# Patient Record
Sex: Female | Born: 1940 | Race: Black or African American | Hispanic: No | Smoking: Never smoker
Health system: Southern US, Community
[De-identification: ages and names within clinical notes are randomized; demographics above are authoritative.]

## PROBLEM LIST (undated history)

## (undated) DIAGNOSIS — Z794 Long term (current) use of insulin: Secondary | ICD-10-CM

## (undated) DIAGNOSIS — E7800 Pure hypercholesterolemia, unspecified: Secondary | ICD-10-CM

## (undated) DIAGNOSIS — Z1231 Encounter for screening mammogram for malignant neoplasm of breast: Secondary | ICD-10-CM

## (undated) DIAGNOSIS — I10 Essential (primary) hypertension: Secondary | ICD-10-CM

## (undated) DIAGNOSIS — E1121 Type 2 diabetes mellitus with diabetic nephropathy: Secondary | ICD-10-CM

## (undated) DIAGNOSIS — E119 Type 2 diabetes mellitus without complications: Secondary | ICD-10-CM

## (undated) DIAGNOSIS — N3000 Acute cystitis without hematuria: Principal | ICD-10-CM

## (undated) DIAGNOSIS — E041 Nontoxic single thyroid nodule: Secondary | ICD-10-CM

## (undated) DIAGNOSIS — Z1211 Encounter for screening for malignant neoplasm of colon: Secondary | ICD-10-CM

## (undated) DIAGNOSIS — M1990 Unspecified osteoarthritis, unspecified site: Secondary | ICD-10-CM

## (undated) DIAGNOSIS — E785 Hyperlipidemia, unspecified: Secondary | ICD-10-CM

## (undated) DIAGNOSIS — K219 Gastro-esophageal reflux disease without esophagitis: Secondary | ICD-10-CM

## (undated) DIAGNOSIS — Z4789 Encounter for other orthopedic aftercare: Secondary | ICD-10-CM

## (undated) DIAGNOSIS — E559 Vitamin D deficiency, unspecified: Secondary | ICD-10-CM

## (undated) DIAGNOSIS — R350 Frequency of micturition: Secondary | ICD-10-CM

## (undated) DIAGNOSIS — M1712 Unilateral primary osteoarthritis, left knee: Secondary | ICD-10-CM

## (undated) DIAGNOSIS — Z96652 Presence of left artificial knee joint: Secondary | ICD-10-CM

## (undated) DIAGNOSIS — R7989 Other specified abnormal findings of blood chemistry: Secondary | ICD-10-CM

## (undated) DIAGNOSIS — E213 Hyperparathyroidism, unspecified: Secondary | ICD-10-CM

## (undated) DIAGNOSIS — M25562 Pain in left knee: Secondary | ICD-10-CM

## (undated) DIAGNOSIS — E042 Nontoxic multinodular goiter: Secondary | ICD-10-CM

## (undated) DIAGNOSIS — Z471 Aftercare following joint replacement surgery: Secondary | ICD-10-CM

## (undated) HISTORY — DX: Type 2 diabetes mellitus without complications: E11.9

## (undated) HISTORY — DX: Hyperlipidemia, unspecified: E78.5

## (undated) HISTORY — DX: Unspecified osteoarthritis, unspecified site: M19.90

## (undated) HISTORY — DX: Gastro-esophageal reflux disease without esophagitis: K21.9

## (undated) HISTORY — DX: Essential (primary) hypertension: I10

---

## 1982-07-16 HISTORY — PX: BREAST EXCISIONAL BIOPSY: SUR124

## 1982-07-16 HISTORY — PX: BREAST SURGERY: SHX581

## 2000-07-16 HISTORY — PX: REPLACEMENT TOTAL KNEE: SUR1224

## 2011-09-04 DIAGNOSIS — R319 Hematuria, unspecified: Secondary | ICD-10-CM | POA: Diagnosis not present

## 2011-09-04 DIAGNOSIS — K219 Gastro-esophageal reflux disease without esophagitis: Secondary | ICD-10-CM | POA: Diagnosis not present

## 2011-09-04 DIAGNOSIS — I10 Essential (primary) hypertension: Secondary | ICD-10-CM | POA: Diagnosis not present

## 2011-09-04 DIAGNOSIS — N390 Urinary tract infection, site not specified: Secondary | ICD-10-CM | POA: Diagnosis not present

## 2011-09-04 DIAGNOSIS — E119 Type 2 diabetes mellitus without complications: Secondary | ICD-10-CM | POA: Diagnosis not present

## 2011-09-04 DIAGNOSIS — E785 Hyperlipidemia, unspecified: Secondary | ICD-10-CM | POA: Diagnosis not present

## 2011-09-27 DIAGNOSIS — Z9189 Other specified personal risk factors, not elsewhere classified: Secondary | ICD-10-CM | POA: Diagnosis not present

## 2011-09-27 DIAGNOSIS — Z1231 Encounter for screening mammogram for malignant neoplasm of breast: Secondary | ICD-10-CM | POA: Diagnosis not present

## 2011-12-04 DIAGNOSIS — E7800 Pure hypercholesterolemia, unspecified: Secondary | ICD-10-CM | POA: Diagnosis not present

## 2011-12-04 DIAGNOSIS — I10 Essential (primary) hypertension: Secondary | ICD-10-CM | POA: Diagnosis not present

## 2012-01-24 DIAGNOSIS — M549 Dorsalgia, unspecified: Secondary | ICD-10-CM | POA: Diagnosis not present

## 2012-01-24 DIAGNOSIS — M5126 Other intervertebral disc displacement, lumbar region: Secondary | ICD-10-CM | POA: Diagnosis not present

## 2012-02-07 DIAGNOSIS — E119 Type 2 diabetes mellitus without complications: Secondary | ICD-10-CM | POA: Diagnosis not present

## 2012-02-07 DIAGNOSIS — K219 Gastro-esophageal reflux disease without esophagitis: Secondary | ICD-10-CM | POA: Diagnosis not present

## 2012-02-07 DIAGNOSIS — E785 Hyperlipidemia, unspecified: Secondary | ICD-10-CM | POA: Diagnosis not present

## 2012-02-07 DIAGNOSIS — I10 Essential (primary) hypertension: Secondary | ICD-10-CM | POA: Diagnosis not present

## 2012-03-11 DIAGNOSIS — R252 Cramp and spasm: Secondary | ICD-10-CM | POA: Diagnosis not present

## 2012-05-12 DIAGNOSIS — Z23 Encounter for immunization: Secondary | ICD-10-CM | POA: Diagnosis not present

## 2012-05-12 DIAGNOSIS — E785 Hyperlipidemia, unspecified: Secondary | ICD-10-CM | POA: Diagnosis not present

## 2012-05-12 DIAGNOSIS — I10 Essential (primary) hypertension: Secondary | ICD-10-CM | POA: Diagnosis not present

## 2012-05-12 DIAGNOSIS — D1739 Benign lipomatous neoplasm of skin and subcutaneous tissue of other sites: Secondary | ICD-10-CM | POA: Diagnosis not present

## 2012-05-12 DIAGNOSIS — E119 Type 2 diabetes mellitus without complications: Secondary | ICD-10-CM | POA: Diagnosis not present

## 2012-05-15 DIAGNOSIS — R222 Localized swelling, mass and lump, trunk: Secondary | ICD-10-CM | POA: Diagnosis not present

## 2012-05-21 DIAGNOSIS — D1779 Benign lipomatous neoplasm of other sites: Secondary | ICD-10-CM | POA: Diagnosis not present

## 2012-05-21 DIAGNOSIS — Z79899 Other long term (current) drug therapy: Secondary | ICD-10-CM | POA: Diagnosis not present

## 2012-05-21 DIAGNOSIS — Z01818 Encounter for other preprocedural examination: Secondary | ICD-10-CM | POA: Diagnosis not present

## 2012-05-27 DIAGNOSIS — Z79899 Other long term (current) drug therapy: Secondary | ICD-10-CM | POA: Diagnosis not present

## 2012-05-27 DIAGNOSIS — D179 Benign lipomatous neoplasm, unspecified: Secondary | ICD-10-CM | POA: Diagnosis not present

## 2012-05-27 DIAGNOSIS — D1779 Benign lipomatous neoplasm of other sites: Secondary | ICD-10-CM | POA: Diagnosis not present

## 2012-05-27 DIAGNOSIS — R229 Localized swelling, mass and lump, unspecified: Secondary | ICD-10-CM | POA: Diagnosis not present

## 2012-06-09 DIAGNOSIS — D179 Benign lipomatous neoplasm, unspecified: Secondary | ICD-10-CM | POA: Diagnosis not present

## 2012-07-21 DIAGNOSIS — B351 Tinea unguium: Secondary | ICD-10-CM | POA: Diagnosis not present

## 2012-07-21 DIAGNOSIS — E1142 Type 2 diabetes mellitus with diabetic polyneuropathy: Secondary | ICD-10-CM | POA: Diagnosis not present

## 2012-07-21 DIAGNOSIS — M21619 Bunion of unspecified foot: Secondary | ICD-10-CM | POA: Diagnosis not present

## 2012-07-21 DIAGNOSIS — E1149 Type 2 diabetes mellitus with other diabetic neurological complication: Secondary | ICD-10-CM | POA: Diagnosis not present

## 2012-08-25 DIAGNOSIS — E7889 Other lipoprotein metabolism disorders: Secondary | ICD-10-CM | POA: Diagnosis not present

## 2012-08-25 DIAGNOSIS — E119 Type 2 diabetes mellitus without complications: Secondary | ICD-10-CM | POA: Diagnosis not present

## 2012-08-25 DIAGNOSIS — E785 Hyperlipidemia, unspecified: Secondary | ICD-10-CM | POA: Diagnosis not present

## 2012-08-25 DIAGNOSIS — I10 Essential (primary) hypertension: Secondary | ICD-10-CM | POA: Diagnosis not present

## 2012-10-13 DIAGNOSIS — R42 Dizziness and giddiness: Secondary | ICD-10-CM | POA: Diagnosis not present

## 2012-10-13 DIAGNOSIS — R1110 Vomiting, unspecified: Secondary | ICD-10-CM | POA: Diagnosis not present

## 2012-10-13 DIAGNOSIS — R110 Nausea: Secondary | ICD-10-CM | POA: Diagnosis not present

## 2012-10-28 DIAGNOSIS — Z9189 Other specified personal risk factors, not elsewhere classified: Secondary | ICD-10-CM | POA: Diagnosis not present

## 2012-10-28 DIAGNOSIS — Z1231 Encounter for screening mammogram for malignant neoplasm of breast: Secondary | ICD-10-CM | POA: Diagnosis not present

## 2012-11-24 DIAGNOSIS — M5430 Sciatica, unspecified side: Secondary | ICD-10-CM | POA: Diagnosis not present

## 2012-11-24 DIAGNOSIS — M25569 Pain in unspecified knee: Secondary | ICD-10-CM | POA: Diagnosis not present

## 2012-11-24 DIAGNOSIS — I10 Essential (primary) hypertension: Secondary | ICD-10-CM | POA: Diagnosis not present

## 2012-11-24 DIAGNOSIS — K219 Gastro-esophageal reflux disease without esophagitis: Secondary | ICD-10-CM | POA: Diagnosis not present

## 2012-11-24 DIAGNOSIS — E119 Type 2 diabetes mellitus without complications: Secondary | ICD-10-CM | POA: Diagnosis not present

## 2012-11-24 DIAGNOSIS — E785 Hyperlipidemia, unspecified: Secondary | ICD-10-CM | POA: Diagnosis not present

## 2013-02-02 DIAGNOSIS — B351 Tinea unguium: Secondary | ICD-10-CM | POA: Diagnosis not present

## 2013-02-02 DIAGNOSIS — E1149 Type 2 diabetes mellitus with other diabetic neurological complication: Secondary | ICD-10-CM | POA: Diagnosis not present

## 2013-02-02 DIAGNOSIS — R262 Difficulty in walking, not elsewhere classified: Secondary | ICD-10-CM | POA: Diagnosis not present

## 2013-02-02 DIAGNOSIS — M79609 Pain in unspecified limb: Secondary | ICD-10-CM | POA: Diagnosis not present

## 2013-03-24 DIAGNOSIS — I10 Essential (primary) hypertension: Secondary | ICD-10-CM | POA: Diagnosis not present

## 2013-03-24 DIAGNOSIS — M5430 Sciatica, unspecified side: Secondary | ICD-10-CM | POA: Diagnosis not present

## 2013-03-24 DIAGNOSIS — IMO0001 Reserved for inherently not codable concepts without codable children: Secondary | ICD-10-CM | POA: Diagnosis not present

## 2013-03-24 DIAGNOSIS — K219 Gastro-esophageal reflux disease without esophagitis: Secondary | ICD-10-CM | POA: Diagnosis not present

## 2013-03-24 DIAGNOSIS — E785 Hyperlipidemia, unspecified: Secondary | ICD-10-CM | POA: Diagnosis not present

## 2013-04-20 DIAGNOSIS — E119 Type 2 diabetes mellitus without complications: Secondary | ICD-10-CM | POA: Diagnosis not present

## 2013-04-22 DIAGNOSIS — M545 Low back pain: Secondary | ICD-10-CM | POA: Diagnosis not present

## 2013-04-22 DIAGNOSIS — M5430 Sciatica, unspecified side: Secondary | ICD-10-CM | POA: Diagnosis not present

## 2013-04-22 DIAGNOSIS — R209 Unspecified disturbances of skin sensation: Secondary | ICD-10-CM | POA: Diagnosis not present

## 2013-04-22 DIAGNOSIS — M79609 Pain in unspecified limb: Secondary | ICD-10-CM | POA: Diagnosis not present

## 2013-04-22 DIAGNOSIS — M5126 Other intervertebral disc displacement, lumbar region: Secondary | ICD-10-CM | POA: Diagnosis not present

## 2013-04-22 DIAGNOSIS — M4310 Spondylolisthesis, site unspecified: Secondary | ICD-10-CM | POA: Diagnosis not present

## 2013-04-22 DIAGNOSIS — M47817 Spondylosis without myelopathy or radiculopathy, lumbosacral region: Secondary | ICD-10-CM | POA: Diagnosis not present

## 2013-05-11 DIAGNOSIS — M79609 Pain in unspecified limb: Secondary | ICD-10-CM | POA: Diagnosis not present

## 2013-05-11 DIAGNOSIS — R262 Difficulty in walking, not elsewhere classified: Secondary | ICD-10-CM | POA: Diagnosis not present

## 2013-05-11 DIAGNOSIS — B351 Tinea unguium: Secondary | ICD-10-CM | POA: Diagnosis not present

## 2013-05-11 DIAGNOSIS — Z23 Encounter for immunization: Secondary | ICD-10-CM | POA: Diagnosis not present

## 2013-05-11 DIAGNOSIS — E1149 Type 2 diabetes mellitus with other diabetic neurological complication: Secondary | ICD-10-CM | POA: Diagnosis not present

## 2013-06-24 DIAGNOSIS — E785 Hyperlipidemia, unspecified: Secondary | ICD-10-CM | POA: Diagnosis not present

## 2013-06-24 DIAGNOSIS — I10 Essential (primary) hypertension: Secondary | ICD-10-CM | POA: Diagnosis not present

## 2013-06-24 DIAGNOSIS — K219 Gastro-esophageal reflux disease without esophagitis: Secondary | ICD-10-CM | POA: Diagnosis not present

## 2013-06-25 DIAGNOSIS — M47817 Spondylosis without myelopathy or radiculopathy, lumbosacral region: Secondary | ICD-10-CM | POA: Diagnosis not present

## 2013-06-25 DIAGNOSIS — E119 Type 2 diabetes mellitus without complications: Secondary | ICD-10-CM | POA: Diagnosis not present

## 2013-06-25 DIAGNOSIS — R29818 Other symptoms and signs involving the nervous system: Secondary | ICD-10-CM | POA: Diagnosis not present

## 2013-06-25 DIAGNOSIS — I10 Essential (primary) hypertension: Secondary | ICD-10-CM | POA: Diagnosis not present

## 2013-06-25 DIAGNOSIS — Z01812 Encounter for preprocedural laboratory examination: Secondary | ICD-10-CM | POA: Diagnosis not present

## 2013-08-17 DIAGNOSIS — M79609 Pain in unspecified limb: Secondary | ICD-10-CM | POA: Diagnosis not present

## 2013-08-17 DIAGNOSIS — R262 Difficulty in walking, not elsewhere classified: Secondary | ICD-10-CM | POA: Diagnosis not present

## 2013-08-17 DIAGNOSIS — E1149 Type 2 diabetes mellitus with other diabetic neurological complication: Secondary | ICD-10-CM | POA: Diagnosis not present

## 2013-08-17 DIAGNOSIS — B351 Tinea unguium: Secondary | ICD-10-CM | POA: Diagnosis not present

## 2013-08-19 DIAGNOSIS — M48062 Spinal stenosis, lumbar region with neurogenic claudication: Secondary | ICD-10-CM | POA: Diagnosis not present

## 2013-09-25 DIAGNOSIS — E785 Hyperlipidemia, unspecified: Secondary | ICD-10-CM | POA: Diagnosis not present

## 2013-09-25 DIAGNOSIS — R5381 Other malaise: Secondary | ICD-10-CM | POA: Diagnosis not present

## 2013-09-25 DIAGNOSIS — E119 Type 2 diabetes mellitus without complications: Secondary | ICD-10-CM | POA: Diagnosis not present

## 2013-10-09 DIAGNOSIS — E785 Hyperlipidemia, unspecified: Secondary | ICD-10-CM | POA: Diagnosis not present

## 2013-10-09 DIAGNOSIS — E119 Type 2 diabetes mellitus without complications: Secondary | ICD-10-CM | POA: Diagnosis not present

## 2013-10-09 DIAGNOSIS — I10 Essential (primary) hypertension: Secondary | ICD-10-CM | POA: Diagnosis not present

## 2013-11-24 ENCOUNTER — Ambulatory Visit: Payer: Self-pay | Admitting: Otolaryngology

## 2013-11-24 DIAGNOSIS — G4700 Insomnia, unspecified: Secondary | ICD-10-CM | POA: Diagnosis not present

## 2013-11-24 DIAGNOSIS — R0609 Other forms of dyspnea: Secondary | ICD-10-CM | POA: Diagnosis not present

## 2013-11-24 DIAGNOSIS — G4761 Periodic limb movement disorder: Secondary | ICD-10-CM | POA: Diagnosis not present

## 2013-11-24 DIAGNOSIS — G4733 Obstructive sleep apnea (adult) (pediatric): Secondary | ICD-10-CM | POA: Diagnosis not present

## 2013-11-24 DIAGNOSIS — G478 Other sleep disorders: Secondary | ICD-10-CM | POA: Diagnosis not present

## 2013-12-02 ENCOUNTER — Ambulatory Visit: Payer: Self-pay | Admitting: Otolaryngology

## 2013-12-02 DIAGNOSIS — G4761 Periodic limb movement disorder: Secondary | ICD-10-CM | POA: Diagnosis not present

## 2013-12-02 DIAGNOSIS — G4700 Insomnia, unspecified: Secondary | ICD-10-CM | POA: Diagnosis not present

## 2013-12-02 DIAGNOSIS — G4733 Obstructive sleep apnea (adult) (pediatric): Secondary | ICD-10-CM | POA: Diagnosis not present

## 2013-12-29 DIAGNOSIS — M1990 Unspecified osteoarthritis, unspecified site: Secondary | ICD-10-CM | POA: Diagnosis not present

## 2013-12-29 DIAGNOSIS — E785 Hyperlipidemia, unspecified: Secondary | ICD-10-CM | POA: Diagnosis not present

## 2013-12-29 DIAGNOSIS — Z23 Encounter for immunization: Secondary | ICD-10-CM | POA: Diagnosis not present

## 2013-12-29 DIAGNOSIS — I10 Essential (primary) hypertension: Secondary | ICD-10-CM | POA: Diagnosis not present

## 2013-12-29 DIAGNOSIS — K219 Gastro-esophageal reflux disease without esophagitis: Secondary | ICD-10-CM | POA: Diagnosis not present

## 2013-12-29 DIAGNOSIS — E119 Type 2 diabetes mellitus without complications: Secondary | ICD-10-CM | POA: Diagnosis not present

## 2014-03-01 DIAGNOSIS — Z23 Encounter for immunization: Secondary | ICD-10-CM | POA: Diagnosis not present

## 2014-03-01 DIAGNOSIS — K219 Gastro-esophageal reflux disease without esophagitis: Secondary | ICD-10-CM | POA: Diagnosis not present

## 2014-03-01 DIAGNOSIS — I10 Essential (primary) hypertension: Secondary | ICD-10-CM | POA: Diagnosis not present

## 2014-03-01 DIAGNOSIS — E785 Hyperlipidemia, unspecified: Secondary | ICD-10-CM | POA: Diagnosis not present

## 2014-03-01 DIAGNOSIS — M1990 Unspecified osteoarthritis, unspecified site: Secondary | ICD-10-CM | POA: Diagnosis not present

## 2014-03-01 DIAGNOSIS — R42 Dizziness and giddiness: Secondary | ICD-10-CM | POA: Diagnosis not present

## 2014-03-01 LAB — CBC AND DIFFERENTIAL
HEMATOCRIT: 37 % (ref 36–46)
HEMOGLOBIN: 12.5 g/dL (ref 12.0–16.0)
Platelets: 369 10*3/uL (ref 150–399)
WBC: 4.4 10^3/mL

## 2014-03-01 LAB — HEPATIC FUNCTION PANEL: AST: 16 U/L (ref 13–35)

## 2014-04-06 DIAGNOSIS — E041 Nontoxic single thyroid nodule: Secondary | ICD-10-CM | POA: Diagnosis not present

## 2014-04-06 DIAGNOSIS — H9319 Tinnitus, unspecified ear: Secondary | ICD-10-CM | POA: Diagnosis not present

## 2014-04-06 DIAGNOSIS — G4733 Obstructive sleep apnea (adult) (pediatric): Secondary | ICD-10-CM | POA: Diagnosis not present

## 2014-04-08 ENCOUNTER — Ambulatory Visit: Payer: Self-pay | Admitting: Otolaryngology

## 2014-04-08 DIAGNOSIS — E041 Nontoxic single thyroid nodule: Secondary | ICD-10-CM | POA: Diagnosis not present

## 2014-04-26 ENCOUNTER — Ambulatory Visit: Payer: Self-pay | Admitting: Otolaryngology

## 2014-04-26 DIAGNOSIS — I10 Essential (primary) hypertension: Secondary | ICD-10-CM | POA: Diagnosis not present

## 2014-04-26 DIAGNOSIS — Z23 Encounter for immunization: Secondary | ICD-10-CM | POA: Diagnosis not present

## 2014-04-26 DIAGNOSIS — Z1389 Encounter for screening for other disorder: Secondary | ICD-10-CM | POA: Diagnosis not present

## 2014-04-26 DIAGNOSIS — E041 Nontoxic single thyroid nodule: Secondary | ICD-10-CM | POA: Diagnosis not present

## 2014-04-26 DIAGNOSIS — E119 Type 2 diabetes mellitus without complications: Secondary | ICD-10-CM | POA: Diagnosis not present

## 2014-04-26 DIAGNOSIS — Z0000 Encounter for general adult medical examination without abnormal findings: Secondary | ICD-10-CM | POA: Diagnosis not present

## 2014-05-04 DIAGNOSIS — G4733 Obstructive sleep apnea (adult) (pediatric): Secondary | ICD-10-CM | POA: Diagnosis not present

## 2014-05-04 DIAGNOSIS — E041 Nontoxic single thyroid nodule: Secondary | ICD-10-CM | POA: Diagnosis not present

## 2014-05-06 DIAGNOSIS — E041 Nontoxic single thyroid nodule: Secondary | ICD-10-CM | POA: Diagnosis not present

## 2014-07-01 DIAGNOSIS — H2513 Age-related nuclear cataract, bilateral: Secondary | ICD-10-CM | POA: Diagnosis not present

## 2014-08-05 DIAGNOSIS — E1129 Type 2 diabetes mellitus with other diabetic kidney complication: Secondary | ICD-10-CM | POA: Diagnosis not present

## 2014-08-05 DIAGNOSIS — M179 Osteoarthritis of knee, unspecified: Secondary | ICD-10-CM | POA: Diagnosis not present

## 2014-08-05 DIAGNOSIS — I10 Essential (primary) hypertension: Secondary | ICD-10-CM | POA: Diagnosis not present

## 2014-08-05 DIAGNOSIS — E785 Hyperlipidemia, unspecified: Secondary | ICD-10-CM | POA: Diagnosis not present

## 2014-10-05 DIAGNOSIS — E041 Nontoxic single thyroid nodule: Secondary | ICD-10-CM | POA: Diagnosis not present

## 2014-10-05 DIAGNOSIS — G4733 Obstructive sleep apnea (adult) (pediatric): Secondary | ICD-10-CM | POA: Diagnosis not present

## 2014-12-02 DIAGNOSIS — K219 Gastro-esophageal reflux disease without esophagitis: Secondary | ICD-10-CM | POA: Diagnosis not present

## 2014-12-02 DIAGNOSIS — E538 Deficiency of other specified B group vitamins: Secondary | ICD-10-CM | POA: Insufficient documentation

## 2014-12-02 DIAGNOSIS — Z79899 Other long term (current) drug therapy: Secondary | ICD-10-CM | POA: Diagnosis not present

## 2014-12-02 DIAGNOSIS — R079 Chest pain, unspecified: Secondary | ICD-10-CM | POA: Diagnosis not present

## 2014-12-02 DIAGNOSIS — E119 Type 2 diabetes mellitus without complications: Secondary | ICD-10-CM | POA: Diagnosis not present

## 2014-12-02 DIAGNOSIS — E785 Hyperlipidemia, unspecified: Secondary | ICD-10-CM | POA: Diagnosis not present

## 2014-12-02 DIAGNOSIS — I10 Essential (primary) hypertension: Secondary | ICD-10-CM | POA: Diagnosis not present

## 2014-12-02 LAB — LIPID PANEL
CHOLESTEROL: 133 mg/dL (ref 0–200)
HDL: 60 mg/dL (ref 35–70)
LDL CALC: 55 mg/dL
Triglycerides: 88 mg/dL (ref 40–160)

## 2014-12-02 LAB — BASIC METABOLIC PANEL
BUN: 14 mg/dL (ref 4–21)
Creatinine: 0.9 mg/dL (ref 0.5–1.1)
Glucose: 146 mg/dL
POTASSIUM: 4.1 mmol/L (ref 3.4–5.3)
SODIUM: 141 mmol/L (ref 137–147)

## 2014-12-02 LAB — HEPATIC FUNCTION PANEL: ALT: 14 U/L (ref 7–35)

## 2014-12-02 LAB — HEMOGLOBIN A1C: HEMOGLOBIN A1C: 6.2 % — AB (ref 4.0–6.0)

## 2015-02-25 ENCOUNTER — Other Ambulatory Visit: Payer: Self-pay

## 2015-02-25 ENCOUNTER — Telehealth: Payer: Self-pay | Admitting: Family Medicine

## 2015-02-25 DIAGNOSIS — E1129 Type 2 diabetes mellitus with other diabetic kidney complication: Secondary | ICD-10-CM | POA: Insufficient documentation

## 2015-02-25 DIAGNOSIS — E119 Type 2 diabetes mellitus without complications: Secondary | ICD-10-CM

## 2015-02-25 NOTE — Telephone Encounter (Signed)
This is a pt of Dr. Caryn Section and she stated she needs this before Monday. Pt stated she needs a new test meter and testing supplies sent to [facility]. Thanks TNP

## 2015-04-20 ENCOUNTER — Other Ambulatory Visit: Payer: Self-pay | Admitting: Family Medicine

## 2015-05-02 ENCOUNTER — Ambulatory Visit: Payer: Self-pay | Admitting: Family Medicine

## 2015-05-02 DIAGNOSIS — R809 Proteinuria, unspecified: Secondary | ICD-10-CM

## 2015-05-02 DIAGNOSIS — E785 Hyperlipidemia, unspecified: Secondary | ICD-10-CM | POA: Insufficient documentation

## 2015-05-02 DIAGNOSIS — R5383 Other fatigue: Secondary | ICD-10-CM | POA: Insufficient documentation

## 2015-05-02 DIAGNOSIS — K219 Gastro-esophageal reflux disease without esophagitis: Secondary | ICD-10-CM | POA: Insufficient documentation

## 2015-05-02 DIAGNOSIS — M179 Osteoarthritis of knee, unspecified: Secondary | ICD-10-CM | POA: Insufficient documentation

## 2015-05-02 DIAGNOSIS — I10 Essential (primary) hypertension: Secondary | ICD-10-CM | POA: Insufficient documentation

## 2015-05-02 DIAGNOSIS — M1710 Unilateral primary osteoarthritis, unspecified knee: Secondary | ICD-10-CM | POA: Insufficient documentation

## 2015-05-04 ENCOUNTER — Ambulatory Visit (INDEPENDENT_AMBULATORY_CARE_PROVIDER_SITE_OTHER): Payer: Medicare Other | Admitting: Family Medicine

## 2015-05-04 ENCOUNTER — Encounter: Payer: Self-pay | Admitting: Family Medicine

## 2015-05-04 VITALS — BP 130/70 | HR 66 | Temp 97.9°F | Resp 16 | Ht 64.0 in | Wt 198.0 lb

## 2015-05-04 DIAGNOSIS — Z23 Encounter for immunization: Secondary | ICD-10-CM | POA: Diagnosis not present

## 2015-05-04 DIAGNOSIS — I10 Essential (primary) hypertension: Secondary | ICD-10-CM

## 2015-05-04 DIAGNOSIS — E119 Type 2 diabetes mellitus without complications: Secondary | ICD-10-CM

## 2015-05-04 DIAGNOSIS — E538 Deficiency of other specified B group vitamins: Secondary | ICD-10-CM | POA: Diagnosis not present

## 2015-05-04 DIAGNOSIS — E1129 Type 2 diabetes mellitus with other diabetic kidney complication: Secondary | ICD-10-CM

## 2015-05-04 LAB — POCT GLYCOSYLATED HEMOGLOBIN (HGB A1C)
Est. average glucose Bld gHb Est-mCnc: 126
Hemoglobin A1C: 6

## 2015-05-04 MED ORDER — METFORMIN HCL 1000 MG PO TABS: 1000.0000 mg | ORAL_TABLET | Freq: Two times a day (BID) | ORAL | Status: DC

## 2015-05-04 NOTE — Progress Notes (Signed)
Patient: Karen Castaneda Female    DOB: June 24, 1941   74 y.o.   MRN: [id number] Visit Date: 05/04/2015  Today's Provider: Lelon Huh, MD   Chief Complaint  Patient presents with  . Hypertension  . Diabetes   Subjective:    HPI   Diabetes Mellitus Type II, Follow-up:   Lab Results  Component Value Date   HGBA1C 6.2* 12/02/2014    Last seen for diabetes 5 months ago.  Management since then includes advising patient to get strict with diet. She reports good compliance with treatment. She is not having side effects.  Current symptoms include none and have been stable. Home blood sugar records: 119-145 fasting  Episodes of hypoglycemia? no   Current Insulin Regimen:  none Most Recent Eye Exam: 1 year Weight trend: stable Prior visit with dietician: no Current diet: in general, a "healthy" diet   Current exercise: walking 3 times a week  Pertinent Labs:    Component Value Date/Time   CHOL 133 12/02/2014   TRIG 88 12/02/2014   CREATININE 0.9 12/02/2014    Wt Readings from Last 3 Encounters:  05/04/15 198 lb (89.812 kg)  12/02/14 205 lb (92.987 kg)    ------------------------------------------------------------------------   Hypertension, follow-up:  BP Readings from Last 3 Encounters:  05/04/15 130/70  12/02/14 132/70    She was last seen for hypertension 5 months ago.  BP at that visit was  132/70. Management since that visit includes  none. She reports good compliance with treatment. She is not having side effects.  She is exercising. She is adherent to low salt diet.   Outside blood pressures are  120's/70's. She is experiencing occasional chest pain Patient denies chest pressure/discomfort, claudication, dyspnea, exertional chest pressure/discomfort, fatigue, irregular heart beat, lower extremity edema, near-syncope, orthopnea, palpitations, paroxysmal nocturnal dyspnea, syncope and tachypnea.   Cardiovascular risk factors include advanced  age (older than 24 for men, 30 for women), diabetes mellitus and hypertension.  Use of agents associated with hypertension: NSAIDS.     Weight trend: stable Wt Readings from Last 3 Encounters:  05/04/15 198 lb (89.812 kg)  12/02/14 205 lb (92.987 kg)    Current diet: in general, a "healthy" diet    ------------------------------------------------------------------------  Vitamin B12 Deficiency: Last office visit was 5 months ago. Changes made during that visit includes starting OTC Vitamin B12 1000mg  daily. Patient reports good compliance with treatment and good tolerance.       No Known Allergies Previous Medications   ASCORBIC ACID (VITAMIN C) 100 MG TABLET    Take 100 mg by mouth daily.   ASPIRIN EC 81 MG TABLET    Take 81 mg by mouth daily.   FLUTICASONE (FLONASE) 50 MCG/ACT NASAL SPRAY    Place 2 sprays into both nostrils daily.   MAGNESIUM OXIDE 400 (240 MG) MG TABS    Take 1 tablet by mouth daily.   METFORMIN (GLUCOPHAGE) 1000 MG TABLET    Take 1,000 mg by mouth 2 (two) times daily.   OMEPRAZOLE 20 MG TBEC    Take 1 tablet by mouth daily.   SIMVASTATIN (ZOCOR) 20 MG TABLET    TAKE 1 TABLET AT BEDTIME   VALSARTAN-HYDROCHLOROTHIAZIDE (DIOVAN-HCT) 80-12.5 MG TABLET    TAKE 1 TABLET DAILY   VITAMIN B-12 (CYANOCOBALAMIN) 1000 MCG TABLET    Take 1,000 mcg by mouth daily.   VITAMIN E 100 UNIT CAPSULE    Take 100 Units by mouth daily.    Review  of Systems  Constitutional: Negative for fever, chills, appetite change and fatigue.  Respiratory: Negative for chest tightness and shortness of breath.   Cardiovascular: Positive for chest pain (occasional). Negative for palpitations.  Gastrointestinal: Negative for nausea, vomiting and abdominal pain.  Musculoskeletal: Positive for myalgias (pain in legs).  Neurological: Negative for dizziness and weakness.    Social History  Substance Use Topics  . Smoking status: Never Smoker   . Smokeless tobacco: Not on file  . Alcohol Use:  No   Objective:   BP 130/70 mmHg  Pulse 66  Temp(Src) 97.9 F (36.6 C) (Oral)  Resp 16  Ht 5\' 4"  (1.626 m)  Wt 198 lb (89.812 kg)  BMI 33.97 kg/m2  SpO2 98%  Physical Exam   General Appearance:    Alert, cooperative, no distress  Eyes:    PERRL, conjunctiva/corneas clear, EOM's intact       Lungs:     Clear to auscultation bilaterally, respirations unlabored  Heart:    Regular rate and rhythm  Neurologic:   Awake, alert, oriented x 3. No apparent focal neurological           defect.       Diabetic Foot Exam - Simple   Simple Foot Form  Diabetic Foot exam was performed with the following findings:  Yes 05/04/2015  9:30 AM  Visual Inspection  No deformities, no ulcerations, no other skin breakdown bilaterally:  Yes  Sensation Testing  Intact to touch and monofilament testing bilaterally:  Yes  Pulse Check  Posterior Tibialis and Dorsalis pulse intact bilaterally:  Yes  Comments       Results for orders placed or performed in visit on 05/04/15  POCT HgB A1C  Result Value Ref Range   Hemoglobin A1C 6.0    Est. average glucose Bld gHb Est-mCnc 126        Assessment & Plan:     .1. Controlled type 2 diabetes mellitus without complication, without long-term current use of insulin ([place]) Well controlled.  Continue current medications.   - POCT HgB A1C  2. Essential hypertension Well controlled.  Continue current medications.    3. 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 Need for influenza vaccination  - Flu vaccine HIGH DOSE PF      Return in about 6 months (around 11/02/2015).   Lelon Huh, MD  [facility]

## 2015-05-10 ENCOUNTER — Other Ambulatory Visit: Payer: Self-pay | Admitting: Otolaryngology

## 2015-05-10 DIAGNOSIS — H903 Sensorineural hearing loss, bilateral: Secondary | ICD-10-CM | POA: Diagnosis not present

## 2015-05-10 DIAGNOSIS — G4733 Obstructive sleep apnea (adult) (pediatric): Secondary | ICD-10-CM | POA: Diagnosis not present

## 2015-05-10 DIAGNOSIS — E042 Nontoxic multinodular goiter: Secondary | ICD-10-CM

## 2015-05-10 DIAGNOSIS — H9313 Tinnitus, bilateral: Secondary | ICD-10-CM | POA: Diagnosis not present

## 2015-05-10 DIAGNOSIS — H9311 Tinnitus, right ear: Secondary | ICD-10-CM

## 2015-05-10 DIAGNOSIS — E041 Nontoxic single thyroid nodule: Secondary | ICD-10-CM | POA: Diagnosis not present

## 2015-05-19 ENCOUNTER — Other Ambulatory Visit: Payer: Self-pay | Admitting: Otolaryngology

## 2015-05-19 ENCOUNTER — Ambulatory Visit
Admission: RE | Admit: 2015-05-19 | Discharge: 2015-05-19 | Disposition: A | Discharge status: Home / Self Care | Payer: Medicare Other | Source: Ambulatory Visit | Attending: Otolaryngology | Admitting: Otolaryngology

## 2015-05-19 DIAGNOSIS — I679 Cerebrovascular disease, unspecified: Secondary | ICD-10-CM | POA: Insufficient documentation

## 2015-05-19 DIAGNOSIS — E042 Nontoxic multinodular goiter: Secondary | ICD-10-CM | POA: Diagnosis not present

## 2015-05-19 DIAGNOSIS — H9311 Tinnitus, right ear: Secondary | ICD-10-CM | POA: Diagnosis not present

## 2015-05-19 DIAGNOSIS — H9190 Unspecified hearing loss, unspecified ear: Secondary | ICD-10-CM | POA: Diagnosis not present

## 2015-05-19 DIAGNOSIS — S0990XA Unspecified injury of head, initial encounter: Secondary | ICD-10-CM | POA: Diagnosis not present

## 2015-05-19 MED ORDER — GADOBENATE DIMEGLUMINE 529 MG/ML IV SOLN
20.0000 mL | Freq: Once | INTRAVENOUS | Status: AC | PRN
  Administered 2015-05-19: 18 mL via INTRAVENOUS

## 2015-05-30 DIAGNOSIS — H903 Sensorineural hearing loss, bilateral: Secondary | ICD-10-CM | POA: Diagnosis not present

## 2015-05-30 DIAGNOSIS — H9311 Tinnitus, right ear: Secondary | ICD-10-CM | POA: Diagnosis not present

## 2015-05-30 DIAGNOSIS — J301 Allergic rhinitis due to pollen: Secondary | ICD-10-CM | POA: Diagnosis not present

## 2015-05-30 DIAGNOSIS — E041 Nontoxic single thyroid nodule: Secondary | ICD-10-CM | POA: Diagnosis not present

## 2015-07-27 DIAGNOSIS — E119 Type 2 diabetes mellitus without complications: Secondary | ICD-10-CM | POA: Diagnosis not present

## 2015-07-27 LAB — HM DIABETES EYE EXAM

## 2015-07-29 ENCOUNTER — Encounter: Payer: Self-pay | Admitting: *Deleted

## 2015-08-08 DIAGNOSIS — E669 Obesity, unspecified: Secondary | ICD-10-CM | POA: Diagnosis not present

## 2015-08-08 DIAGNOSIS — G4733 Obstructive sleep apnea (adult) (pediatric): Secondary | ICD-10-CM | POA: Diagnosis not present

## 2015-08-08 DIAGNOSIS — I679 Cerebrovascular disease, unspecified: Secondary | ICD-10-CM | POA: Diagnosis not present

## 2015-08-08 DIAGNOSIS — H9311 Tinnitus, right ear: Secondary | ICD-10-CM | POA: Diagnosis not present

## 2015-09-28 ENCOUNTER — Telehealth: Payer: Self-pay

## 2015-09-28 ENCOUNTER — Encounter: Payer: Self-pay | Admitting: Family Medicine

## 2015-09-28 ENCOUNTER — Ambulatory Visit (INDEPENDENT_AMBULATORY_CARE_PROVIDER_SITE_OTHER): Payer: Medicare Other | Admitting: Family Medicine

## 2015-09-28 VITALS — BP 146/72 | HR 67 | Temp 97.8°F | Resp 16 | Wt 206.0 lb

## 2015-09-28 DIAGNOSIS — R42 Dizziness and giddiness: Secondary | ICD-10-CM

## 2015-09-28 DIAGNOSIS — E785 Hyperlipidemia, unspecified: Secondary | ICD-10-CM

## 2015-09-28 DIAGNOSIS — E1129 Type 2 diabetes mellitus with other diabetic kidney complication: Secondary | ICD-10-CM

## 2015-09-28 DIAGNOSIS — I10 Essential (primary) hypertension: Secondary | ICD-10-CM

## 2015-09-28 LAB — POCT URINALYSIS DIPSTICK
BILIRUBIN UA: NEGATIVE
Glucose, UA: NEGATIVE
Ketones, UA: NEGATIVE
Leukocytes, UA: NEGATIVE
NITRITE UA: NEGATIVE
PH UA: 6
PROTEIN UA: NEGATIVE
Spec Grav, UA: 1.02
Urobilinogen, UA: 0.2

## 2015-09-28 NOTE — Telephone Encounter (Signed)
Patient called stating that she has been dizzy since yesterday. She also reports having mild chest pain. Patient states she has had similar symptoms in the past. Patient denies, shortness of breath, numbness, tingling, headache or blurred vision. Home blood sugars have been running 132 and blood pressure has been running 128/80. Appointment has been scheduled today at 1:30 for evaluation.

## 2015-09-28 NOTE — Progress Notes (Signed)
Patient: Karen Castaneda Female    DOB: 07-16-1941   75 y.o.   MRN: [id number] Visit Date: 09/28/2015  Today's Provider: Lelon Huh, MD   Chief Complaint  Patient presents with  . Dizziness    x 1 day   Subjective:    Dizziness The current episode started yesterday (started yesterday while pumping gas). The problem has been unchanged. Associated symptoms include coughing (dry), diaphoresis and vertigo (feels like the room is spinning). Pertinent negatives include no abdominal pain, anorexia, arthralgias, chills, congestion, fatigue, fever, headaches, joint swelling, myalgias, nausea, neck pain, numbness, rash, sore throat, swollen glands, urinary symptoms, visual change, vomiting or weakness. Chest pain: mild chest pain on the right side. Nothing aggravates the symptoms.   RUQ pain intermittently the last few months. Has been under more stress lately.     No Known Allergies Previous Medications   ASCORBIC ACID (VITAMIN C) 100 MG TABLET    Take 100 mg by mouth daily.   ASPIRIN EC 81 MG TABLET    Take 81 mg by mouth daily.   FLUTICASONE (FLONASE) 50 MCG/ACT NASAL SPRAY    Place 2 sprays into both nostrils daily.   MAGNESIUM OXIDE 400 (240 MG) MG TABS    Take 1 tablet by mouth daily.   METFORMIN (GLUCOPHAGE) 1000 MG TABLET    Take 1 tablet (1,000 mg total) by mouth 2 (two) times daily.   OMEPRAZOLE 20 MG TBEC    Take 1 tablet by mouth daily.   SIMVASTATIN (ZOCOR) 20 MG TABLET    TAKE 1 TABLET AT BEDTIME   VALSARTAN-HYDROCHLOROTHIAZIDE (DIOVAN-HCT) 80-12.5 MG TABLET    TAKE 1 TABLET DAILY   VITAMIN B-12 (CYANOCOBALAMIN) 1000 MCG TABLET    Take 1,000 mcg by mouth daily.   VITAMIN E 100 UNIT CAPSULE    Take 100 Units by mouth daily.    Review of Systems  Constitutional: Positive for diaphoresis. Negative for fever, chills, appetite change and fatigue.  HENT: Negative for congestion and sore throat.   Respiratory: Positive for cough (dry). Negative for chest tightness,  shortness of breath and wheezing.   Cardiovascular: Negative for palpitations. Chest pain: mild chest pain on the right side.  Gastrointestinal: Positive for diarrhea (yesterday). Negative for nausea, vomiting, abdominal pain and anorexia.  Endocrine: Negative for polydipsia, polyphagia and polyuria.  Musculoskeletal: Negative for myalgias, joint swelling, arthralgias and neck pain.  Skin: Negative for rash.  Neurological: Positive for dizziness and vertigo (feels like the room is spinning). Negative for tremors, seizures, syncope, weakness, numbness and headaches.    Social History  Substance Use Topics  . Smoking status: Never Smoker   . Smokeless tobacco: Not on file  . Alcohol Use: No   Objective:   BP 146/72 mmHg  Pulse 67  Temp(Src) 97.8 F (36.6 C) (Oral)  Resp 16  Wt 206 lb (93.441 kg)  SpO2 96%  Physical Exam   General Appearance:    Alert, cooperative, no distress  Eyes:    PERRL, conjunctiva/corneas clear, EOM's intact       Lungs:     Clear to auscultation bilaterally, respirations unlabored  Heart:    Regular rate and rhythm  Neurologic:   Awake, alert, oriented x 3. No apparent focal neurological           defect.       Results for orders placed or performed in visit on 09/28/15  POCT urinalysis dipstick  Result Value Ref Range  Color, UA amber    Clarity, UA clear    Glucose, UA negative    Bilirubin, UA negative    Ketones, UA negative    Spec Grav, UA 1.020    Blood, UA Trace    pH, UA 6.0    Protein, UA negative    Urobilinogen, UA 0.2    Nitrite, UA negative    Leukocytes, UA Negative Negative       Assessment & Plan:     1. Dizziness Started after exposure to gasoline fumes yesterday. Improved but not resolved today.  - POCT urinalysis dipstick - CBC - Comprehensive metabolic panel  2. Hyperlipidemia She is tolerating simvastatin well with no adverse effects.   - Lipid panel  3. Essential hypertension Stable. Continue current  medications.    4. 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  - Hemoglobin A1c        Lelon Huh, MD  [facility]

## 2015-09-29 ENCOUNTER — Ambulatory Visit: Payer: Medicare Other | Admitting: Family Medicine

## 2015-09-29 LAB — COMPREHENSIVE METABOLIC PANEL
ALK PHOS: 83 IU/L (ref 39–117)
ALT: 11 IU/L (ref 0–32)
AST: 19 IU/L (ref 0–40)
Albumin/Globulin Ratio: 1.8 (ref 1.2–2.2)
Albumin: 4.4 g/dL (ref 3.5–4.8)
BUN/Creatinine Ratio: 19 (ref 11–26)
BUN: 16 mg/dL (ref 8–27)
Bilirubin Total: 0.3 mg/dL (ref 0.0–1.2)
CALCIUM: 10.2 mg/dL (ref 8.7–10.3)
CO2: 25 mmol/L (ref 18–29)
CREATININE: 0.85 mg/dL (ref 0.57–1.00)
Chloride: 99 mmol/L (ref 96–106)
GFR calc Af Amer: 77 mL/min/{1.73_m2} (ref >59)
GFR, EST NON AFRICAN AMERICAN: 67 mL/min/{1.73_m2} (ref >59)
GLUCOSE: 159 mg/dL — AB (ref 65–99)
Globulin, Total: 2.5 g/dL (ref 1.5–4.5)
Potassium: 4.5 mmol/L (ref 3.5–5.2)
Sodium: 140 mmol/L (ref 134–144)
Total Protein: 6.9 g/dL (ref 6.0–8.5)

## 2015-09-29 LAB — HEMOGLOBIN A1C
ESTIMATED AVERAGE GLUCOSE: 146 mg/dL
HEMOGLOBIN A1C: 6.7 % — AB (ref 4.8–5.6)

## 2015-09-29 LAB — CBC
Hematocrit: 36.9 % (ref 34.0–46.6)
Hemoglobin: 12.3 g/dL (ref 11.1–15.9)
MCH: 27.5 pg (ref 26.6–33.0)
MCHC: 33.3 g/dL (ref 31.5–35.7)
MCV: 82 fL (ref 79–97)
PLATELETS: 412 10*3/uL — AB (ref 150–379)
RBC: 4.48 x10E6/uL (ref 3.77–5.28)
RDW: 15.7 % — AB (ref 12.3–15.4)
WBC: 4.9 10*3/uL (ref 3.4–10.8)

## 2015-09-29 LAB — LIPID PANEL
CHOLESTEROL TOTAL: 133 mg/dL (ref 100–199)
Chol/HDL Ratio: 2 ratio units (ref 0.0–4.4)
HDL: 67 mg/dL (ref >39)
LDL Calculated: 49 mg/dL (ref 0–99)
TRIGLYCERIDES: 85 mg/dL (ref 0–149)
VLDL CHOLESTEROL CAL: 17 mg/dL (ref 5–40)

## 2015-11-02 ENCOUNTER — Encounter: Payer: Self-pay | Admitting: Family Medicine

## 2015-11-02 ENCOUNTER — Ambulatory Visit (INDEPENDENT_AMBULATORY_CARE_PROVIDER_SITE_OTHER): Payer: Medicare Other | Admitting: Family Medicine

## 2015-11-02 DIAGNOSIS — R42 Dizziness and giddiness: Secondary | ICD-10-CM

## 2015-11-02 DIAGNOSIS — K219 Gastro-esophageal reflux disease without esophagitis: Secondary | ICD-10-CM | POA: Diagnosis not present

## 2015-11-02 DIAGNOSIS — G629 Polyneuropathy, unspecified: Secondary | ICD-10-CM | POA: Diagnosis not present

## 2015-11-02 MED ORDER — DEXLANSOPRAZOLE 60 MG PO CPDR: 60.0000 mg | DELAYED_RELEASE_CAPSULE | Freq: Every day | ORAL | Status: DC

## 2015-11-02 NOTE — Progress Notes (Signed)
Patient: Karen Castaneda Female    DOB: [birth year]   75 y.o.   MRN: [id number] Visit Date: 11/02/2015  Today's Provider: Lelon Huh, MD   Chief Complaint  Patient presents with  . Follow-up  . Dizziness   Subjective:    Dizziness This is a recurrent problem. The current episode started more than 1 year ago. The problem occurs intermittently. The problem has been unchanged. Associated symptoms include a change in bowel habit, nausea and vertigo. Pertinent negatives include no abdominal pain, anorexia, arthralgias, chest pain, chills, congestion, coughing, diaphoresis, fatigue, fever, headaches, joint swelling, myalgias, neck pain, numbness, rash, sore throat, swollen glands, urinary symptoms, visual change, vomiting or weakness. The symptoms are aggravated by twisting and standing. She has tried nothing for the symptoms.    Follow-up for dizziness from 09/28/2015. Dizziness started after exposure to gasoline fumes. . Labs were checked and were normal and symptoms completely resolved until this weekend when she was at [employer] in [place]. She was watching a moving display when she became dizzy and light headed which lasted an hour or two. She has had no other symptoms at the time and has had no more dizziness since then. She did have MRI in November due to tinnitus which was remarkable only for microvascular changes.  .    She has also having tingling sensation in bilateral legs in both legs. that occurs only at night when she is lying in bed. No claudication. No pain in legs.   She also request a change in her reflux medication since omeprazole is wearing off and having heartburn every night.   No Known Allergies Previous Medications   ASCORBIC ACID (VITAMIN C) 100 MG TABLET    Take 100 mg by mouth daily.   ASPIRIN EC 81 MG TABLET    Take 81 mg by mouth daily.   FLUTICASONE (FLONASE) 50 MCG/ACT NASAL SPRAY    Place 2 sprays into both nostrils daily.   MAGNESIUM  OXIDE 400 (240 MG) MG TABS    Take 1 tablet by mouth daily.   METFORMIN (GLUCOPHAGE) 1000 MG TABLET    Take 1 tablet (1,000 mg total) by mouth 2 (two) times daily.   OMEPRAZOLE 20 MG TBEC    Take 1 tablet by mouth daily.   SIMVASTATIN (ZOCOR) 20 MG TABLET    TAKE 1 TABLET AT BEDTIME   VALSARTAN-HYDROCHLOROTHIAZIDE (DIOVAN-HCT) 80-12.5 MG TABLET    TAKE 1 TABLET DAILY   VITAMIN B-12 (CYANOCOBALAMIN) 1000 MCG TABLET    Take 1,000 mcg by mouth daily.   VITAMIN E 100 UNIT CAPSULE    Take 100 Units by mouth daily.    Review of Systems  Constitutional: Negative for fever, chills, diaphoresis, appetite change and fatigue.  HENT: Negative for congestion and sore throat.   Respiratory: Negative for cough, chest tightness and shortness of breath.   Cardiovascular: Negative for chest pain and palpitations.  Gastrointestinal: Positive for nausea and change in bowel habit. Negative for vomiting, abdominal pain and anorexia.  Musculoskeletal: Negative for myalgias, joint swelling, arthralgias and neck pain.  Skin: Negative for rash.  Neurological: Positive for dizziness and vertigo. Negative for weakness, numbness and headaches.    Social History  Substance Use Topics  . Smoking status: Never Smoker   . Smokeless tobacco: Not on file  . Alcohol Use: No   Objective:   BP 94/54 mmHg  Pulse 69  Temp(Src) 98.1 F (36.7 C) (Oral)  Resp 16  Ht 5\' 4"  (1.626 m)  Wt 204 lb (92.534 kg)  BMI 35.00 kg/m2  SpO2 97%  Physical Exam   General Appearance:    Alert, cooperative, no distress  Eyes:    PERRL, conjunctiva/corneas clear, EOM's intact       Lungs:     Clear to auscultation bilaterally, respirations unlabored  Heart:    Regular rate and rhythm, II/VI systolic murmur. 2+ pedal pulses and cap refill < 1second  Neurologic:   Awake, alert, oriented x 3. No apparent focal neurological           defect.           Assessment & Plan:     1. Dizziness First episode was in setting of gas fume  exposure, and second was triggered by observing moving display at [place], and may have been exacerbated by heat exposure. She did have unremarkable MRI in November and has had focal neurological symptoms or cardiac symptoms.   2. Neuropathy (HCC) Multifactorial, is already on b12 replacement for b12 deficiency, and has underlying diabetes which is controlled. Symptoms are relatively minor at this point.  3. Gastroesophageal reflux disease without esophagitis Having symptoms daily even on omeprazole. Will change to dexilant.  - dexlansoprazole (DEXILANT) 60 MG capsule; Take 1 capsule (60 mg total) by mouth daily.  Dispense: 90 capsule; Refill: 3   Follow up in 3 months for diabetes. Call if dizziness returns.       Lelon Huh, MD  [facility]

## 2015-11-21 ENCOUNTER — Other Ambulatory Visit: Payer: Self-pay | Admitting: Family Medicine

## 2015-11-21 DIAGNOSIS — Z1231 Encounter for screening mammogram for malignant neoplasm of breast: Secondary | ICD-10-CM

## 2015-11-29 DIAGNOSIS — G4733 Obstructive sleep apnea (adult) (pediatric): Secondary | ICD-10-CM | POA: Diagnosis not present

## 2015-11-29 DIAGNOSIS — E041 Nontoxic single thyroid nodule: Secondary | ICD-10-CM | POA: Diagnosis not present

## 2015-12-06 ENCOUNTER — Ambulatory Visit
Admission: RE | Admit: 2015-12-06 | Discharge: 2015-12-06 | Disposition: A | Discharge status: Home / Self Care | Payer: Medicare Other | Source: Ambulatory Visit | Attending: Family Medicine | Admitting: Family Medicine

## 2015-12-06 ENCOUNTER — Other Ambulatory Visit: Payer: Self-pay | Admitting: Family Medicine

## 2015-12-06 DIAGNOSIS — Z1231 Encounter for screening mammogram for malignant neoplasm of breast: Secondary | ICD-10-CM | POA: Insufficient documentation

## 2016-01-15 ENCOUNTER — Other Ambulatory Visit: Payer: Self-pay | Admitting: Family Medicine

## 2016-02-03 ENCOUNTER — Ambulatory Visit
Admission: RE | Admit: 2016-02-03 | Discharge: 2016-02-03 | Disposition: A | Discharge status: Home / Self Care | Payer: Medicare Other | Source: Ambulatory Visit | Attending: Family Medicine | Admitting: Family Medicine

## 2016-02-03 ENCOUNTER — Encounter: Payer: Self-pay | Admitting: Family Medicine

## 2016-02-03 ENCOUNTER — Ambulatory Visit (INDEPENDENT_AMBULATORY_CARE_PROVIDER_SITE_OTHER): Payer: Medicare Other | Admitting: Family Medicine

## 2016-02-03 VITALS — BP 112/62 | HR 83 | Temp 98.0°F | Resp 16 | Ht 64.0 in | Wt 204.0 lb

## 2016-02-03 DIAGNOSIS — E1129 Type 2 diabetes mellitus with other diabetic kidney complication: Secondary | ICD-10-CM | POA: Diagnosis not present

## 2016-02-03 DIAGNOSIS — K219 Gastro-esophageal reflux disease without esophagitis: Secondary | ICD-10-CM

## 2016-02-03 DIAGNOSIS — I10 Essential (primary) hypertension: Secondary | ICD-10-CM

## 2016-02-03 DIAGNOSIS — M179 Osteoarthritis of knee, unspecified: Secondary | ICD-10-CM | POA: Diagnosis not present

## 2016-02-03 DIAGNOSIS — M25562 Pain in left knee: Secondary | ICD-10-CM

## 2016-02-03 LAB — POCT GLYCOSYLATED HEMOGLOBIN (HGB A1C)
Est. average glucose Bld gHb Est-mCnc: 140
Hemoglobin A1C: 6.5

## 2016-02-03 NOTE — Progress Notes (Signed)
Patient: Karen Castaneda Female    DOB: Dec 17, 1940   75 y.o.   MRN: [id number] Visit Date: 02/03/2016  Today's Provider: Lelon Huh, MD   Chief Complaint  Patient presents with  . Follow-up  . Diabetes  . Hypertension  . Hyperlipidemia   Subjective:    HPI  Gastroesophageal reflux disease without esophagitis: From 11/02/2015-Having symptoms daily even on omeprazole.  Changed to dexilant.  Started dexlansoprazole (DEXILANT) 60 MG capsule; Take 1 capsule (60 mg total) by mouth dail y but she states it made her sick on her stomach so she went back to omeprazole which seems to be working much better.     Diabetes Mellitus Type II, Follow-up:   Lab Results  Component Value Date   HGBA1C 6.7* 09/28/2015   HGBA1C 6.0 05/04/2015   HGBA1C 6.2* 12/02/2014   Last seen for diabetes 3 months ago.  Management since then includes; no changes. She reports good compliance with treatment. She is not having side effects. none Current symptoms include none and have been unchanged. Home blood sugar records: fasting range: 150-171  Episodes of hypoglycemia? no   Current Insulin Regimen: n/a Most Recent Eye Exam: 07/27/15 Weight trend: stable Prior visit with dietician: no Current diet: well balanced   ----------------------------------------------------------------------   Hypertension, follow-up:  BP Readings from Last 3 Encounters:  02/03/16 112/62  11/02/15 94/54  09/28/15 146/72    She was last seen for hypertension 3 months ago.  BP at that visit was 146/72. Management since that visit includes; no changes.She reports good compliance with treatment. She is not having side effects. none   She is adherent to low salt diet.   Outside blood pressures are 120/80. She is experiencing none.  Patient denies none.   Cardiovascular risk factors include diabetes mellitus.  Use of agents associated with hypertension: none.    ----------------------------------------------------------------------  Complains of left knee pain for the last month. No specific injury. Not taken any medication for it. No swelling or redness. Hurts to bear weight, but i snot significantly limiting her mobility.   No Known Allergies Current Meds  Medication Sig  . Ascorbic Acid (VITAMIN C) 100 MG tablet Take 100 mg by mouth daily.  Marland Kitchen aspirin EC 81 MG tablet Take 81 mg by mouth daily.  Marland Kitchen dexlansoprazole (DEXILANT) 60 MG capsule Take 1 capsule (60 mg total) by mouth daily.  . fluticasone (FLONASE) 50 MCG/ACT nasal spray Place 2 sprays into both nostrils daily.  . Magnesium Oxide 400 (240 MG) MG TABS Take 1 tablet by mouth daily.  . metFORMIN (GLUCOPHAGE) 1000 MG tablet Take 1 tablet (1,000 mg total) by mouth 2 (two) times daily.  . simvastatin (ZOCOR) 20 MG tablet TAKE 1 TABLET AT BEDTIME  . valsartan-hydrochlorothiazide (DIOVAN-HCT) 80-12.5 MG tablet TAKE 1 TABLET DAILY  . vitamin B-12 (CYANOCOBALAMIN) 1000 MCG tablet Take 1,000 mcg by mouth daily.  . vitamin E 100 UNIT capsule Take 100 Units by mouth daily.    Review of Systems  Constitutional: Negative for fever, chills, appetite change and fatigue.  Respiratory: Negative for chest tightness and shortness of breath.   Cardiovascular: Negative for chest pain and palpitations.  Gastrointestinal: Negative for nausea, vomiting and abdominal pain.  Neurological: Negative for dizziness and weakness.    Social History  Substance Use Topics  . Smoking status: Never Smoker   . Smokeless tobacco: Not on file  . Alcohol Use: No   Objective:   BP 112/62 mmHg  Pulse 83  Temp(Src) 98 F (36.7 C) (Oral)  Resp 16  Ht 5\' 4"  (1.626 m)  Wt 204 lb (92.534 kg)  BMI 35.00 kg/m2  SpO2 99%  Physical Exam   General Appearance:    Alert, cooperative, no distress  Eyes:    PERRL, conjunctiva/corneas clear, EOM's intact       Lungs:     Clear to auscultation bilaterally, respirations  unlabored  Heart:    Regular rate and rhythm  Neurologic:   Awake, alert, oriented x 3. No apparent focal neurological           defect.   MS   Mild tenderness along joint line of left knee. No effusion.        Assessment & Plan:     1. 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  Continue current medications.   - POCT glycosylated hemoglobin (Hb A1C)  2. Left knee pain Likely OA - DG Knee Complete 4 Views Left; Future  3. Essential hypertension Well controlled.  Continue current medications.    4. GERD. Doing well on omepazole which she is tolerating well.        Lelon Huh, MD  [facility]

## 2016-02-06 ENCOUNTER — Telehealth: Payer: Self-pay

## 2016-02-06 DIAGNOSIS — M170 Bilateral primary osteoarthritis of knee: Secondary | ICD-10-CM

## 2016-02-06 NOTE — Telephone Encounter (Signed)
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 Recommend referral to orthopedist.

## 2016-02-06 NOTE — Telephone Encounter (Signed)
Pt advised; she agreed to see an orthopedic.  She states she will see who you recommend.  Thanks,   -Mickel Baas

## 2016-02-06 NOTE — Telephone Encounter (Signed)
Please refer orthopedics for severe OA and pain of knee

## 2016-02-07 NOTE — Telephone Encounter (Signed)
Please schedule referr to orthopedics for severe OA and pain of knee? Thanks!

## 2016-02-10 DIAGNOSIS — M1712 Unilateral primary osteoarthritis, left knee: Secondary | ICD-10-CM | POA: Diagnosis not present

## 2016-05-12 ENCOUNTER — Other Ambulatory Visit: Payer: Self-pay | Admitting: Family Medicine

## 2016-06-01 DIAGNOSIS — E041 Nontoxic single thyroid nodule: Secondary | ICD-10-CM | POA: Diagnosis not present

## 2016-06-01 DIAGNOSIS — G4733 Obstructive sleep apnea (adult) (pediatric): Secondary | ICD-10-CM | POA: Diagnosis not present

## 2016-06-05 ENCOUNTER — Encounter: Payer: Self-pay | Admitting: Family Medicine

## 2016-06-05 ENCOUNTER — Ambulatory Visit (INDEPENDENT_AMBULATORY_CARE_PROVIDER_SITE_OTHER): Payer: Medicare Other | Admitting: Family Medicine

## 2016-06-05 VITALS — BP 112/68 | Temp 97.8°F | Resp 16 | Wt 201.0 lb

## 2016-06-05 DIAGNOSIS — E1129 Type 2 diabetes mellitus with other diabetic kidney complication: Secondary | ICD-10-CM

## 2016-06-05 DIAGNOSIS — E2839 Other primary ovarian failure: Secondary | ICD-10-CM

## 2016-06-05 DIAGNOSIS — Z23 Encounter for immunization: Secondary | ICD-10-CM | POA: Diagnosis not present

## 2016-06-05 DIAGNOSIS — K219 Gastro-esophageal reflux disease without esophagitis: Secondary | ICD-10-CM | POA: Diagnosis not present

## 2016-06-05 DIAGNOSIS — I10 Essential (primary) hypertension: Secondary | ICD-10-CM | POA: Diagnosis not present

## 2016-06-05 LAB — POCT GLYCOSYLATED HEMOGLOBIN (HGB A1C)
Est. average glucose Bld gHb Est-mCnc: 128
HEMOGLOBIN A1C: 6.1

## 2016-06-05 MED ORDER — OMEPRAZOLE 20 MG PO CPDR: 20.0000 mg | DELAYED_RELEASE_CAPSULE | Freq: Every day | ORAL | 4 refills | Status: DC

## 2016-06-05 NOTE — Progress Notes (Signed)
Patient: Karen Castaneda Female    DOB: 29-May-1941   75 y.o.   MRN: [id number] Visit Date: 06/05/2016  Today's Provider: Lelon Huh, MD   Chief Complaint  Patient presents with  . Hypertension  . Diabetes  . Gastroesophageal Reflux   Subjective:    HPI  Hypertension, follow-up:  BP Readings from Last 3 Encounters:  06/05/16 112/68  02/03/16 112/62  11/02/15 (!) 94/54    She was last seen for hypertension 4 months ago.  BP at that visit was 112/62. Management since that visit includes no changes. She reports good compliance with treatment. She is not having side effects.  She is not exercising. She is adherent to low salt diet.   Outside blood pressures are checked occasionally. She is experiencing chest pain. Patient reports that the pain is in her right breast. She states that this comes and goes.  Patient denies exertional chest pressure/discomfort, fatigue, lower extremity edema, palpitations and tachypnea.   Cardiovascular risk factors include diabetes mellitus.     Weight trend: stable Wt Readings from Last 3 Encounters:  06/05/16 201 lb (91.2 kg)  02/03/16 204 lb (92.5 kg)  11/02/15 204 lb (92.5 kg)    Current diet: well balanced    Diabetes Mellitus Type II, Follow-up:   Lab Results  Component Value Date   HGBA1C 6.1 06/05/2016   HGBA1C 6.5 02/03/2016   HGBA1C 6.7 (H) 09/28/2015    Last seen for diabetes 4 months ago.  Management since then includes no changes. She reports good compliance with treatment. She is not having side effects.  Current symptoms include none and have been stable. Home blood sugar records: trend: stable  Episodes of hypoglycemia? no   Current Insulin Regimen: none Most Recent Eye Exam: within the last year.  Weight trend: stable Prior visit with dietician: no Current diet: well balanced Current exercise: none  Pertinent Labs:    Component Value Date/Time   CHOL 133 09/28/2015 1420   TRIG 85  09/28/2015 1420   HDL 67 09/28/2015 1420   LDLCALC 49 09/28/2015 1420   CREATININE 0.85 09/28/2015 1420    Wt Readings from Last 3 Encounters:  06/05/16 201 lb (91.2 kg)  02/03/16 204 lb (92.5 kg)  11/02/15 204 lb (92.5 kg)     GERD,  Follow up: Patient was last seen for this about 4 months ago. No changes were made. Patient reports that Dexilant was sent into the pharmacy, and she would like to continue Omeprazole. She reports that the [place] makes her nauseated after taking it.  Breast pain: Patient reports that she has had pain in her right breast X 8 weeks. She reports that the pain comes and goes. Has it 2-3 times a week and lasts just a minute or two. Is not related to exertion. Often occurs while sitting at rest. Sometimes after eating.      No Known Allergies   Current Outpatient Prescriptions:  .  Ascorbic Acid (VITAMIN C) 100 MG tablet, Take 100 mg by mouth daily., Disp: , Rfl:  .  aspirin EC 81 MG tablet, Take 81 mg by mouth daily., Disp: , Rfl:  .  fluticasone (FLONASE) 50 MCG/ACT nasal spray, Place 2 sprays into both nostrils daily., Disp: , Rfl:  .  Magnesium Oxide 400 (240 MG) MG TABS, Take 1 tablet by mouth daily., Disp: , Rfl:  .  metFORMIN (GLUCOPHAGE) 1000 MG tablet, TAKE 1 TABLET TWICE A DAY, Disp: 180 tablet, Rfl:  4 .  omeprazole (PRILOSEC) 20 MG capsule, Take 20 mg by mouth daily., Disp: , Rfl:  .  simvastatin (ZOCOR) 20 MG tablet, TAKE 1 TABLET AT BEDTIME, Disp: 90 tablet, Rfl: 4 .  valsartan-hydrochlorothiazide (DIOVAN-HCT) 80-12.5 MG tablet, TAKE 1 TABLET DAILY, Disp: 90 tablet, Rfl: 4 .  vitamin B-12 (CYANOCOBALAMIN) 1000 MCG tablet, Take 1,000 mcg by mouth daily., Disp: , Rfl:  .  vitamin E 100 UNIT capsule, Take 100 Units by mouth daily., Disp: , Rfl:   Review of Systems  Constitutional: Negative.   Respiratory: Negative.   Cardiovascular: Positive for chest pain. Negative for palpitations and leg swelling.       Right Breast pain.     Gastrointestinal: Negative.   Endocrine: Negative.   Musculoskeletal: Negative.   Skin: Negative.   Neurological: Negative.     Social History  Substance Use Topics  . Smoking status: Never Smoker  . Smokeless tobacco: Not on file  . Alcohol use No   Objective:   BP 112/68 (BP Location: Left Arm, Patient Position: Sitting, Cuff Size: Normal)   Temp 97.8 F (36.6 C)   Resp 16   Wt 201 lb (91.2 kg)   BMI 34.50 kg/m   Physical Exam   General Appearance:    Alert, cooperative, no distress  Eyes:    PERRL, conjunctiva/corneas clear, EOM's intact       Lungs:     Clear to auscultation bilaterally, respirations unlabored  Heart:    Regular rate and rhythm  Neurologic:   Awake, alert, oriented x 3. No apparent focal neurological           defect.         Results for orders placed or performed in visit on 06/05/16  POCT glycosylated hemoglobin (Hb A1C)  Result Value Ref Range   Hemoglobin A1C 6.1    Est. average glucose Bld gHb Est-mCnc 128        Assessment & Plan:     1. Essential hypertension  - EKG 12-Lead  2. Gastroesophageal reflux disease without esophagitis  - omeprazole (PRILOSEC) 20 MG capsule; Take 1 capsule (20 mg total) by mouth daily. PLEASE CANCEL REFILLS OF DEXLANSOPRAZOLE  Dispense: 90 capsule; Refill: 4  3. Type 2 diabetes mellitus with other diabetic kidney complication, without long-term current use of insulin (HCC) Well controlled.  Continue current medications.    - POCT glycosylated hemoglobin (Hb A1C) - EKG 12-Lead  4. Need for influenza vaccination  - Flu vaccine HIGH DOSE PF (Fluzone High dose)  5. Estrogen deficiency  - DG Bone Density; Future     The entirety of the information documented in the History of Present Illness, Review of Systems and Physical Exam were personally obtained by me. Portions of this information were initially documented by Wilburt Finlay, CMA and reviewed by me for thoroughness and accuracy.   Return  in about 6 months (around 12/03/2016).   Lelon Huh, MD  [facility]

## 2016-08-02 ENCOUNTER — Ambulatory Visit: Payer: Medicare Other

## 2016-08-27 DIAGNOSIS — E119 Type 2 diabetes mellitus without complications: Secondary | ICD-10-CM | POA: Diagnosis not present

## 2016-08-27 LAB — HM DIABETES EYE EXAM

## 2016-08-29 ENCOUNTER — Encounter: Payer: Self-pay | Admitting: *Deleted

## 2016-08-31 ENCOUNTER — Encounter: Payer: Self-pay | Admitting: Family Medicine

## 2016-09-05 ENCOUNTER — Ambulatory Visit: Payer: Medicare Other

## 2016-09-18 ENCOUNTER — Telehealth: Payer: Self-pay | Admitting: Family Medicine

## 2016-09-18 NOTE — Telephone Encounter (Signed)
Called Pt to schedule AWV with [facility] - knb °

## 2016-10-02 IMAGING — US ULTRASOUND CORE BIOPSY
1 series · 12 of 12 positions shown · non-contrast
Comparison: 04/08/2014

CLINICAL DATA: 73-year-old with thyroid nodules. Patient presents
for a left thyroid nodule biopsy.

EXAM:
ULTRASOUND GUIDED NEEDLE ASPIRATE BIOPSY OF THE THYROID GLAND

[Series 1: ultrasound core biopsy · 0.05mm/px · 12 of 12 slices shown]
[im 1/12]
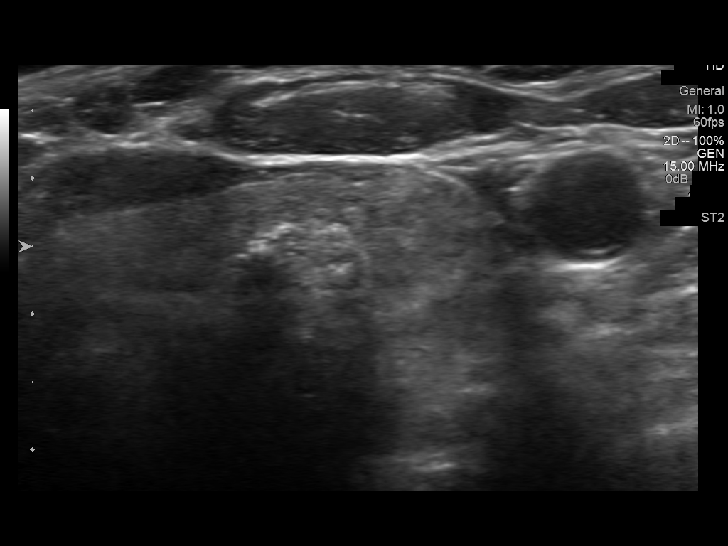
[im 2/12]
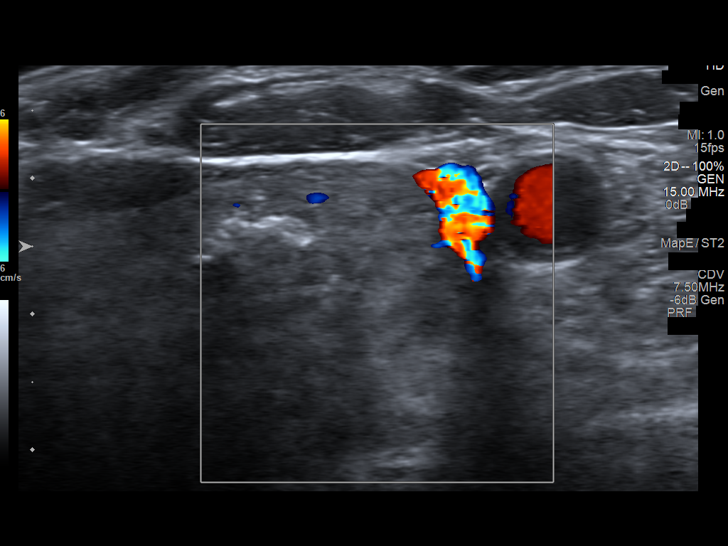
[im 3/12]
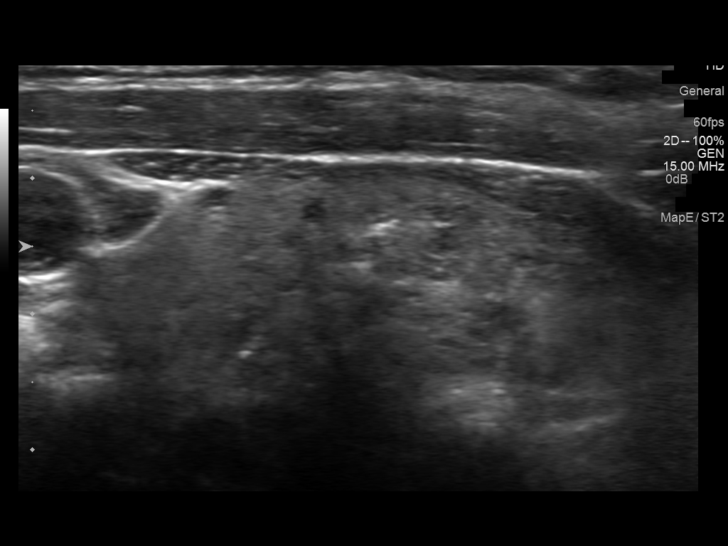
[im 4/12]
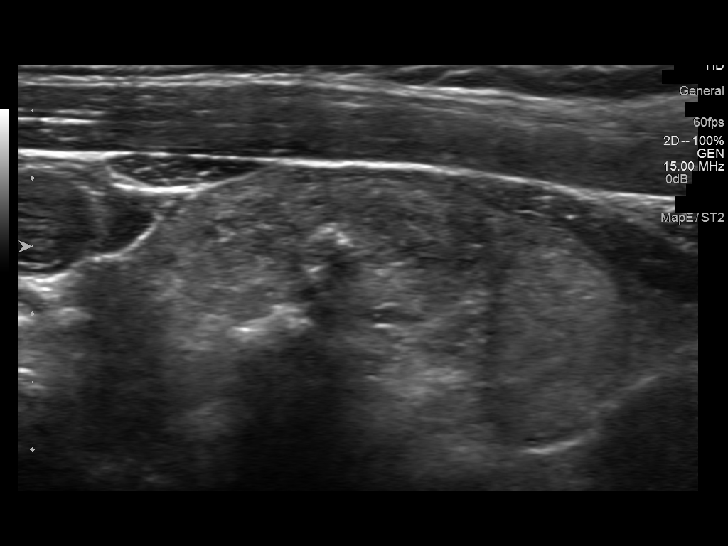
[im 5/12]
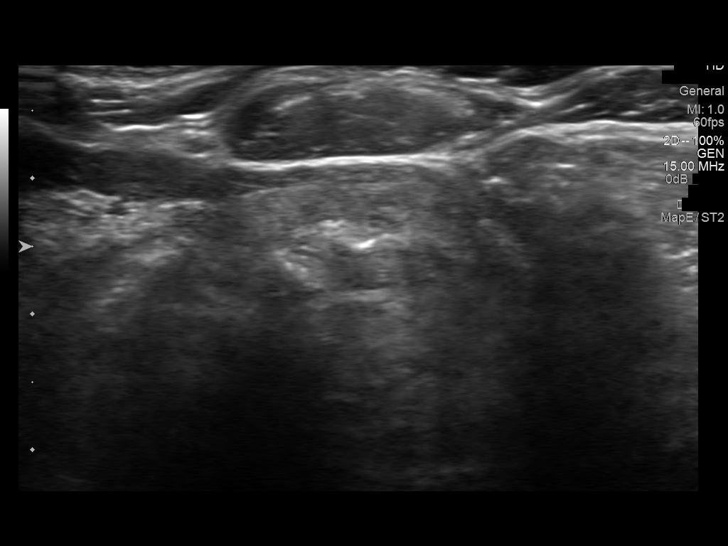
[im 6/12]
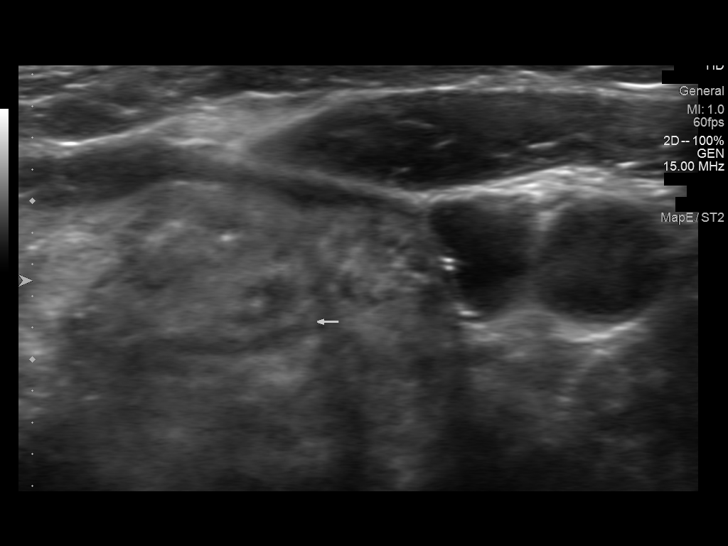
[im 7/12]
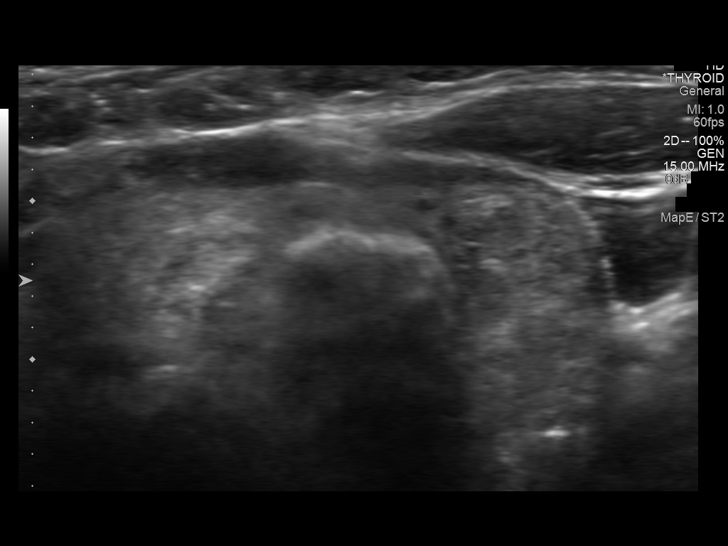
[im 8/12]
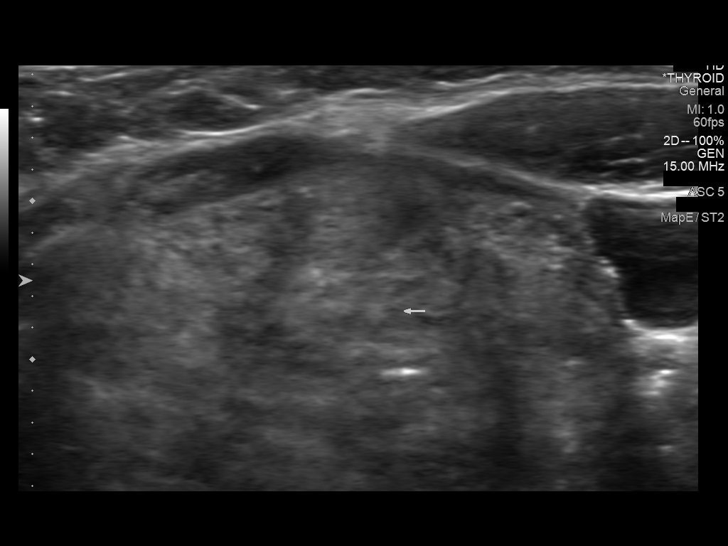
[im 9/12]
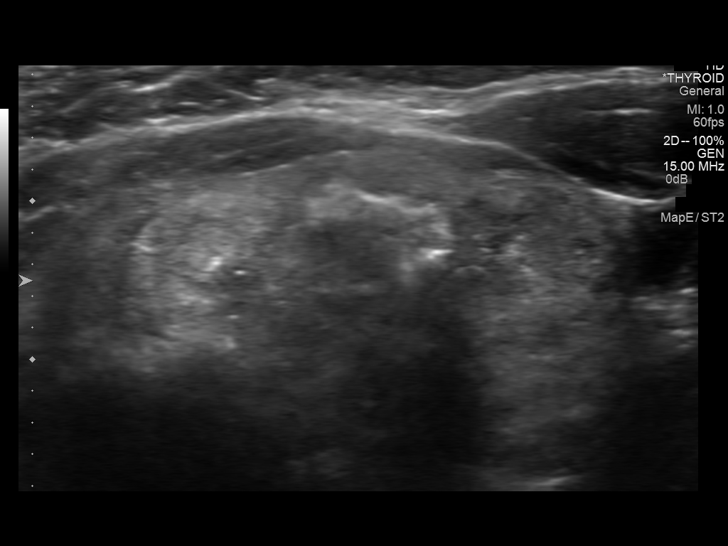
[im 10/12]
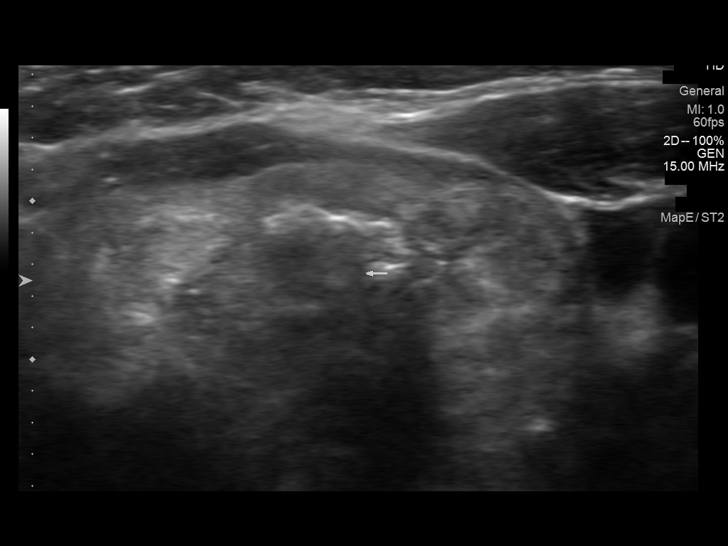
[im 11/12]
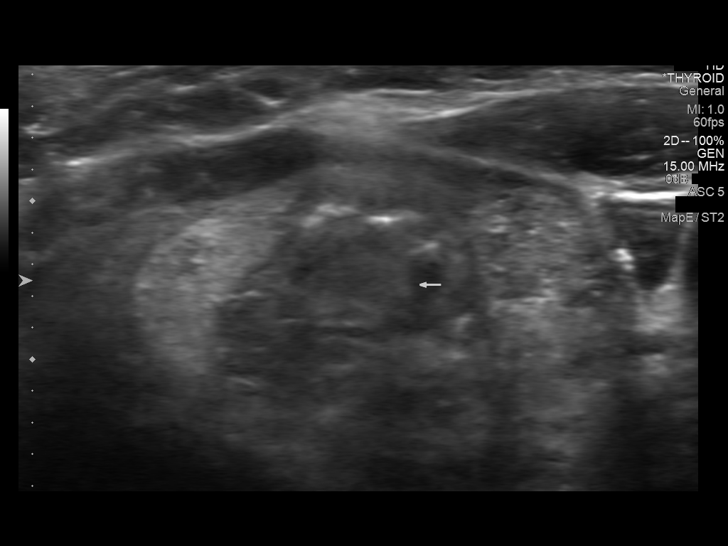
[im 12/12]
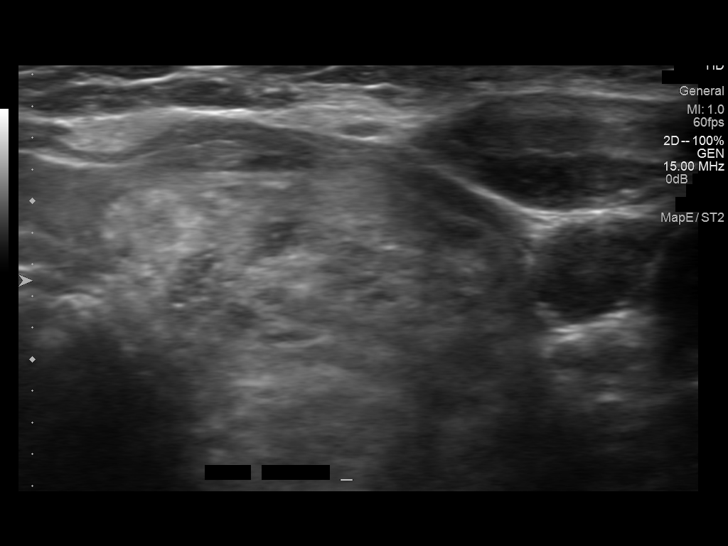

[12 of 12 positions shown; findings below may reference images not displayed]

PROCEDURE:
Thyroid biopsy was thoroughly discussed with the patient and
questions were answered. The benefits, risks, alternatives, and
complications were also discussed. The patient understands and
wishes to proceed with the procedure. Written consent was obtained.



Complications:  None
FINDINGS: The left thyroid lobe is heterogeneous and appears to be replaced by
a large heterogeneous nodule. There are dystrophic calcifications
within this left thyroid lobe.
IMPRESSION: Ultrasound guided needle aspirate biopsy performed of the left
thyroid nodule.

## 2016-10-22 ENCOUNTER — Other Ambulatory Visit: Payer: Self-pay | Admitting: Family Medicine

## 2016-10-22 DIAGNOSIS — K219 Gastro-esophageal reflux disease without esophagitis: Secondary | ICD-10-CM

## 2016-10-22 MED ORDER — OMEPRAZOLE 20 MG PO CPDR: 20.0000 mg | DELAYED_RELEASE_CAPSULE | Freq: Every day | ORAL | 4 refills | Status: DC

## 2016-10-22 MED ORDER — SIMVASTATIN 20 MG PO TABS: 20.0000 mg | ORAL_TABLET | Freq: Every day | ORAL | 4 refills | Status: DC

## 2016-10-22 MED ORDER — METFORMIN HCL 1000 MG PO TABS: 1000.0000 mg | ORAL_TABLET | Freq: Two times a day (BID) | ORAL | 4 refills | Status: DC

## 2016-10-22 MED ORDER — VALSARTAN-HYDROCHLOROTHIAZIDE 80-12.5 MG PO TABS: 1.0000 | ORAL_TABLET | Freq: Every day | ORAL | 4 refills | Status: DC

## 2016-10-22 NOTE — Telephone Encounter (Signed)
Pt needs refills on   metFORMIN (GLUCOPHAGE) 1000 MG tablet  omeprazole (PRILOSEC) 20 MG capsule  simvastatin (ZOCOR) 20 MG tablet  valsartan-hydrochlorothiazide (DIOVAN-HCT) 80-12.5 MG tablet  Taking 01/15/16 -- Birdie Sons, MD    TAKE 1 TABLET DAILY     [facility]  Thanks, Con Memos

## 2016-11-27 ENCOUNTER — Ambulatory Visit
Admission: RE | Admit: 2016-11-27 | Discharge: 2016-11-27 | Disposition: A | Discharge status: Home / Self Care | Payer: Medicare Other | Source: Ambulatory Visit | Attending: Family Medicine | Admitting: Family Medicine

## 2016-11-27 DIAGNOSIS — E2839 Other primary ovarian failure: Secondary | ICD-10-CM | POA: Insufficient documentation

## 2016-11-27 DIAGNOSIS — Z780 Asymptomatic menopausal state: Secondary | ICD-10-CM | POA: Diagnosis not present

## 2016-11-30 DIAGNOSIS — G4733 Obstructive sleep apnea (adult) (pediatric): Secondary | ICD-10-CM | POA: Diagnosis not present

## 2016-11-30 DIAGNOSIS — E041 Nontoxic single thyroid nodule: Secondary | ICD-10-CM | POA: Diagnosis not present

## 2016-12-03 ENCOUNTER — Ambulatory Visit (INDEPENDENT_AMBULATORY_CARE_PROVIDER_SITE_OTHER): Payer: Medicare Other | Admitting: Family Medicine

## 2016-12-03 ENCOUNTER — Other Ambulatory Visit: Payer: Self-pay | Admitting: Family Medicine

## 2016-12-03 ENCOUNTER — Encounter: Payer: Self-pay | Admitting: Family Medicine

## 2016-12-03 VITALS — BP 120/60 | HR 63 | Temp 98.0°F | Resp 16 | Ht 64.0 in | Wt 200.0 lb

## 2016-12-03 DIAGNOSIS — E1129 Type 2 diabetes mellitus with other diabetic kidney complication: Secondary | ICD-10-CM

## 2016-12-03 DIAGNOSIS — E538 Deficiency of other specified B group vitamins: Secondary | ICD-10-CM

## 2016-12-03 DIAGNOSIS — I10 Essential (primary) hypertension: Secondary | ICD-10-CM | POA: Diagnosis not present

## 2016-12-03 DIAGNOSIS — E785 Hyperlipidemia, unspecified: Secondary | ICD-10-CM

## 2016-12-03 DIAGNOSIS — Z1231 Encounter for screening mammogram for malignant neoplasm of breast: Secondary | ICD-10-CM

## 2016-12-03 LAB — POCT GLYCOSYLATED HEMOGLOBIN (HGB A1C)
Est. average glucose Bld gHb Est-mCnc: 128
HEMOGLOBIN A1C: 6.1

## 2016-12-03 NOTE — Progress Notes (Signed)
Patient: Karen Castaneda Female    DOB: 01-16-1941   76 y.o.   MRN: [id number] Visit Date: 12/03/2016  Today's Provider: Lelon Huh, MD   Chief Complaint  Patient presents with  . Follow-up  . Diabetes  . Hypertension  . Gastroesophageal Reflux   Subjective:    HPI   Diabetes Mellitus Type II, Follow-up:   Lab Results  Component Value Date   HGBA1C 6.1 06/05/2016   HGBA1C 6.5 02/03/2016   HGBA1C 6.7 (H) 09/28/2015   Last seen for diabetes 6 months ago.  Management since then includes; no changes. She reports good compliance with treatment. She is not having side effects. none Current symptoms include none and have been unchanged. Home blood sugar records: fasting range: 106-130  Episodes of hypoglycemia? no   Current Insulin Regimen: n/a Most Recent Eye Exam: 08/27/2016 Weight trend: stable Prior visit with dietician: no Current diet: well balanced Current exercise: walking  ----------------------------------------------------------------    Hypertension, follow-up:  BP Readings from Last 3 Encounters:  12/03/16 120/60  06/05/16 112/68  02/03/16 112/62    She was last seen for hypertension 6 months ago.  BP at that visit was 112/68. Management since that visit includes; no changes.She reports good compliance with treatment. She is not having side effects. none She is exercising. She is adherent to low salt diet.   Outside blood pressures are normal. She is experiencing none.  Patient denies none.   Cardiovascular risk factors include diabetes mellitus.  Use of agents associated with hypertension: none.   ----------------------------------------------------------------  Gastroesophageal reflux disease without esophagitis From 06/05/2017-no changes.    No Known Allergies   Current Outpatient Prescriptions:  .  Ascorbic Acid (VITAMIN C) 100 MG tablet, Take 100 mg by mouth daily., Disp: , Rfl:  .  aspirin EC 81 MG tablet, Take 81 mg  by mouth daily., Disp: , Rfl:  .  fluticasone (FLONASE) 50 MCG/ACT nasal spray, Place 2 sprays into both nostrils daily., Disp: , Rfl:  .  Magnesium Oxide 400 (240 MG) MG TABS, Take 1 tablet by mouth daily., Disp: , Rfl:  .  metFORMIN (GLUCOPHAGE) 1000 MG tablet, Take 1 tablet (1,000 mg total) by mouth 2 (two) times daily., Disp: 180 tablet, Rfl: 4 .  omeprazole (PRILOSEC) 20 MG capsule, Take 1 capsule (20 mg total) by mouth daily. PLEASE CANCEL REFILLS OF DEXLANSOPRAZOLE, Disp: 90 capsule, Rfl: 4 .  simvastatin (ZOCOR) 20 MG tablet, Take 1 tablet (20 mg total) by mouth at bedtime., Disp: 90 tablet, Rfl: 4 .  valsartan-hydrochlorothiazide (DIOVAN-HCT) 80-12.5 MG tablet, Take 1 tablet by mouth daily., Disp: 90 tablet, Rfl: 4 .  vitamin B-12 (CYANOCOBALAMIN) 1000 MCG tablet, Take 1,000 mcg by mouth daily., Disp: , Rfl:  .  vitamin E 100 UNIT capsule, Take 100 Units by mouth daily., Disp: , Rfl:   Review of Systems  Constitutional: Negative for appetite change, chills, fatigue and fever.  Respiratory: Negative for chest tightness and shortness of breath.   Cardiovascular: Negative for chest pain and palpitations.  Gastrointestinal: Negative for abdominal pain, nausea and vomiting.  Neurological: Negative for dizziness and weakness.    Social History  Substance Use Topics  . Smoking status: Never Smoker  . Smokeless tobacco: Never Used  . Alcohol use No   Objective:   BP 120/60 (BP Location: Right Arm, Patient Position: Sitting, Cuff Size: Large)   Pulse 63   Temp 98 F (36.7 C) (Oral)   Resp 16  Ht 5\' 4"  (1.626 m)   Wt 200 lb (90.7 kg)   SpO2 97%   BMI 34.33 kg/m  Vitals:   12/03/16 0812  BP: 120/60  Pulse: 63  Resp: 16  Temp: 98 F (36.7 C)  TempSrc: Oral  SpO2: 97%  Weight: 200 lb (90.7 kg)  Height: 5\' 4"  (1.626 m)   Depression screen [facility] 2/9 12/03/2016 05/04/2015  Decreased Interest 0 0  Down, Depressed, Hopeless 3 0  PHQ - 2 Score 3 0  Altered sleeping 0 -  Tired,  decreased energy 0 -  Change in appetite 0 -  Feeling bad or failure about yourself  0 -  Trouble concentrating 2 -  Moving slowly or fidgety/restless 0 -  Suicidal thoughts 2 -  PHQ-9 Score 7 -  Difficult doing work/chores Not difficult at all -      Physical Exam   General Appearance:    Alert, cooperative, no distress  Eyes:    PERRL, conjunctiva/corneas clear, EOM's intact       Lungs:     Clear to auscultation bilaterally, respirations unlabored  Heart:    Regular rate and rhythm  Neurologic:   Awake, alert, oriented x 3. No apparent focal neurological           defect.       Results for orders placed or performed in visit on 12/03/16  POCT glycosylated hemoglobin (Hb A1C)  Result Value Ref Range   Hemoglobin A1C 6.1    Est. average glucose Bld gHb Est-mCnc 128        Assessment & Plan:     1. Type 2 diabetes mellitus with other diabetic kidney complication, without long-term current use of insulin (HCC) Well controlled.  Continue current medications.   - POCT glycosylated hemoglobin (Hb A1C)  2. Essential hypertension Well controlled.  Continue current medications.    3. Hyperlipidemia, unspecified hyperlipidemia type She is tolerating simvastatin well with no adverse effects.   - Comprehensive metabolic panel - Lipid panel  4. B12 deficiency  - Vitamin B12       Lelon Huh, MD  [facility]

## 2016-12-03 NOTE — Patient Instructions (Signed)
Please call the [facility] ([phone]) to schedule a routine screening mammogram.  

## 2016-12-04 ENCOUNTER — Telehealth: Payer: Self-pay

## 2016-12-04 LAB — COMPREHENSIVE METABOLIC PANEL
ALT: 14 IU/L (ref 0–32)
AST: 24 IU/L (ref 0–40)
Albumin/Globulin Ratio: 1.7 (ref 1.2–2.2)
Albumin: 4.3 g/dL (ref 3.5–4.8)
Alkaline Phosphatase: 75 IU/L (ref 39–117)
BUN / CREAT RATIO: 18 (ref 12–28)
BUN: 16 mg/dL (ref 8–27)
Bilirubin Total: 0.4 mg/dL (ref 0.0–1.2)
CALCIUM: 10.5 mg/dL — AB (ref 8.7–10.3)
CHLORIDE: 103 mmol/L (ref 96–106)
CO2: 23 mmol/L (ref 18–29)
CREATININE: 0.88 mg/dL (ref 0.57–1.00)
GFR calc non Af Amer: 64 mL/min/{1.73_m2} (ref >59)
GFR, EST AFRICAN AMERICAN: 73 mL/min/{1.73_m2} (ref >59)
GLOBULIN, TOTAL: 2.6 g/dL (ref 1.5–4.5)
GLUCOSE: 145 mg/dL — AB (ref 65–99)
Potassium: 4.2 mmol/L (ref 3.5–5.2)
SODIUM: 141 mmol/L (ref 134–144)
Total Protein: 6.9 g/dL (ref 6.0–8.5)

## 2016-12-04 LAB — LIPID PANEL
Chol/HDL Ratio: 2.2 ratio (ref 0.0–4.4)
Cholesterol, Total: 134 mg/dL (ref 100–199)
HDL: 62 mg/dL (ref >39)
LDL CALC: 58 mg/dL (ref 0–99)
Triglycerides: 72 mg/dL (ref 0–149)
VLDL CHOLESTEROL CAL: 14 mg/dL (ref 5–40)

## 2016-12-04 LAB — VITAMIN B12: VITAMIN B 12: 747 pg/mL (ref 232–1245)

## 2016-12-04 NOTE — Telephone Encounter (Signed)
-----   Message from Birdie Sons, MD sent at 12/04/2016  7:51 AM EDT ----- Blood sugar, kidney functions, electrolytes and cholesterol are all normal. Continue current medications.  Check labs yearly. Follow up in November as scheduled.

## 2016-12-04 NOTE — Telephone Encounter (Signed)
LMTCB 12/04/2016  Thanks,   -Mickel Baas

## 2016-12-05 NOTE — Telephone Encounter (Signed)
LMTCB 12/05/2016  Thanks,   -Laura  

## 2016-12-05 NOTE — Telephone Encounter (Signed)
Pt advised.   Thanks,   -Laura  

## 2016-12-21 ENCOUNTER — Ambulatory Visit
Admission: RE | Admit: 2016-12-21 | Discharge: 2016-12-21 | Disposition: A | Discharge status: Home / Self Care | Payer: Medicare Other | Source: Ambulatory Visit | Attending: Family Medicine | Admitting: Family Medicine

## 2016-12-21 DIAGNOSIS — Z1231 Encounter for screening mammogram for malignant neoplasm of breast: Secondary | ICD-10-CM | POA: Diagnosis not present

## 2017-01-19 ENCOUNTER — Other Ambulatory Visit: Payer: Self-pay | Admitting: Family Medicine

## 2017-04-03 ENCOUNTER — Telehealth: Payer: Self-pay | Admitting: Family Medicine

## 2017-04-24 ENCOUNTER — Ambulatory Visit: Payer: Medicare Other | Admitting: Family Medicine

## 2017-04-26 ENCOUNTER — Ambulatory Visit (INDEPENDENT_AMBULATORY_CARE_PROVIDER_SITE_OTHER): Payer: Medicare Other | Admitting: Family Medicine

## 2017-04-26 ENCOUNTER — Encounter: Payer: Self-pay | Admitting: Family Medicine

## 2017-04-26 VITALS — BP 130/70 | HR 74 | Temp 97.8°F | Resp 16 | Wt 193.0 lb

## 2017-04-26 DIAGNOSIS — E785 Hyperlipidemia, unspecified: Secondary | ICD-10-CM

## 2017-04-26 DIAGNOSIS — E1129 Type 2 diabetes mellitus with other diabetic kidney complication: Secondary | ICD-10-CM

## 2017-04-26 DIAGNOSIS — I10 Essential (primary) hypertension: Secondary | ICD-10-CM

## 2017-04-26 LAB — POCT GLYCOSYLATED HEMOGLOBIN (HGB A1C)
Est. average glucose Bld gHb Est-mCnc: 120
Hemoglobin A1C: 5.8

## 2017-04-26 NOTE — Progress Notes (Signed)
Patient: Karen Castaneda Female    DOB: 1941-02-28   76 y.o.   MRN: [id number] Visit Date: 04/26/2017  Today's Provider: Lelon Huh, MD   Chief Complaint  Patient presents with  . Hyperlipidemia    follow up  . Hypertension    follow up  . Diabetes    follow up   Subjective:    HPI  Diabetes Mellitus Type II, Follow-up:   Lab Results  Component Value Date   HGBA1C 6.1 12/03/2016   HGBA1C 6.1 06/05/2016   HGBA1C 6.5 02/03/2016   Last seen for diabetes 5 months ago.  Management since then includes no changes. She reports good compliance with treatment. She is not having side effects.  Current symptoms include none and have been stable. Home blood sugar records: fasting range: 91-127  Episodes of hypoglycemia? no   Current Insulin Regimen: none Most Recent Eye Exam: 07/2016 Weight trend: decreasing steadily Prior visit with dietician: no Current diet: well balanced Current exercise: none  ------------------------------------------------------------------------   Hypertension, follow-up:  BP Readings from Last 3 Encounters:  12/03/16 120/60  06/05/16 112/68  02/03/16 112/62    She was last seen for hypertension 5 months ago.  BP at that visit was 120/60. Management since that visit includes no changes.She reports good compliance with treatment. She is not having side effects.  She is not exercising. She is adherent to low salt diet.   Outside blood pressures are normal per patient report. She is experiencing none.  Patient denies chest pain, chest pressure/discomfort, claudication, dyspnea, exertional chest pressure/discomfort, fatigue, irregular heart beat, lower extremity edema, near-syncope, orthopnea, palpitations, paroxysmal nocturnal dyspnea, syncope and tachypnea.   Cardiovascular risk factors include advanced age (older than 25 for men, 56 for women), dyslipidemia and hypertension.  Use of agents associated with hypertension: NSAIDS.    ------------------------------------------------------------------------    Lipid/Cholesterol, Follow-up:   Last seen for this 5 months ago.  Management since that visit includes none.  Last Lipid Panel:    Component Value Date/Time   CHOL 134 12/03/2016 0848   TRIG 72 12/03/2016 0848   HDL 62 12/03/2016 0848   CHOLHDL 2.2 12/03/2016 0848   LDLCALC 58 12/03/2016 0848    She reports good compliance with treatment. She is not having side effects.   Wt Readings from Last 3 Encounters:  12/03/16 200 lb (90.7 kg)  06/05/16 201 lb (91.2 kg)  02/03/16 204 lb (92.5 kg)    ------------------------------------------------------------------------ Follow up of Vitamin B12 deficiency:  Patient was last seen for this problem 5 months ago and no changes were made.     No Known Allergies   Current Outpatient Prescriptions:  .  Ascorbic Acid (VITAMIN C) 100 MG tablet, Take 100 mg by mouth daily., Disp: , Rfl:  .  aspirin EC 81 MG tablet, Take 81 mg by mouth daily., Disp: , Rfl:  .  Magnesium Oxide 400 (240 MG) MG TABS, Take 1 tablet by mouth daily., Disp: , Rfl:  .  metFORMIN (GLUCOPHAGE) 1000 MG tablet, Take 1 tablet (1,000 mg total) by mouth 2 (two) times daily., Disp: 180 tablet, Rfl: 4 .  simvastatin (ZOCOR) 20 MG tablet, TAKE 1 TABLET AT BEDTIME, Disp: 90 tablet, Rfl: 3 .  valsartan-hydrochlorothiazide (DIOVAN-HCT) 80-12.5 MG tablet, TAKE 1 TABLET DAILY, Disp: 90 tablet, Rfl: 3 .  vitamin B-12 (CYANOCOBALAMIN) 1000 MCG tablet, Take 1,000 mcg by mouth daily., Disp: , Rfl:  .  vitamin E 100 UNIT capsule, Take 100  Units by mouth daily., Disp: , Rfl:   Review of Systems  Constitutional: Negative for appetite change, chills, fatigue and fever.  Respiratory: Negative for chest tightness and shortness of breath.   Cardiovascular: Negative for chest pain and palpitations.  Gastrointestinal: Negative for abdominal pain, nausea and vomiting.  Neurological: Negative for dizziness and  weakness.    Social History  Substance Use Topics  . Smoking status: Never Smoker  . Smokeless tobacco: Never Used  . Alcohol use No   Objective:   BP 130/70 (BP Location: Right Arm, Patient Position: Sitting, Cuff Size: Large)   Pulse 74   Temp 97.8 F (36.6 C) (Oral)   Resp 16   Wt 193 lb (87.5 kg)   SpO2 97% Comment: room air  BMI 33.13 kg/m  There were no vitals filed for this visit.   Physical Exam   General Appearance:    Alert, cooperative, no distress  Eyes:    PERRL, conjunctiva/corneas clear, EOM's intact       Lungs:     Clear to auscultation bilaterally, respirations unlabored  Heart:    Regular rate and rhythm  Neurologic:   Awake, alert, oriented x 3. No apparent focal neurological           defect.       Results for orders placed or performed in visit on 04/26/17  POCT HgB A1C  Result Value Ref Range   Hemoglobin A1C 5.8    Est. average glucose Bld gHb Est-mCnc 120        Assessment & Plan:     1. Type 2 diabetes mellitus with other diabetic kidney complication, without long-term current use of insulin (HCC) Well controlled.  Continue current medications.   - POCT HgB A1C  2. Hyperlipidemia, unspecified hyperlipidemia type She is tolerating simvastatin well with no adverse effects.    3. Essential hypertension Well controlled.  Continue current medications.    Deferred flu vaccine since no vaccine is in stock. Recommend she get vaccine from pharmacy.        Lelon Huh, MD  [facility]

## 2017-06-05 ENCOUNTER — Ambulatory Visit: Payer: Medicare Other | Admitting: Family Medicine

## 2017-06-12 DIAGNOSIS — E785 Hyperlipidemia, unspecified: Secondary | ICD-10-CM | POA: Diagnosis not present

## 2017-06-12 DIAGNOSIS — I10 Essential (primary) hypertension: Secondary | ICD-10-CM | POA: Diagnosis not present

## 2017-06-12 DIAGNOSIS — E119 Type 2 diabetes mellitus without complications: Secondary | ICD-10-CM | POA: Diagnosis not present

## 2017-07-08 ENCOUNTER — Other Ambulatory Visit: Payer: Self-pay | Admitting: Family Medicine

## 2017-09-13 DIAGNOSIS — E785 Hyperlipidemia, unspecified: Secondary | ICD-10-CM | POA: Diagnosis not present

## 2017-09-13 DIAGNOSIS — E119 Type 2 diabetes mellitus without complications: Secondary | ICD-10-CM | POA: Diagnosis not present

## 2017-09-17 ENCOUNTER — Encounter: Attending: Internal Medicine | Primary: Internal Medicine

## 2017-10-17 NOTE — Telephone Encounter (Signed)
complete

## 2017-11-12 ENCOUNTER — Encounter

## 2017-11-19 ENCOUNTER — Inpatient Hospital Stay: Admit: 2017-11-19 | Payer: MEDICARE | Attending: Internal Medicine | Primary: Internal Medicine

## 2017-11-19 DIAGNOSIS — Z1231 Encounter for screening mammogram for malignant neoplasm of breast: Secondary | ICD-10-CM

## 2017-11-26 ENCOUNTER — Telehealth: Payer: Self-pay

## 2017-11-26 NOTE — Telephone Encounter (Signed)
LM for pt to CB and schedule AWV and CPE. -MM

## 2017-12-04 NOTE — Telephone Encounter (Signed)
Spoke with pt who declined the AWV due to moving to [place]. -MM

## 2017-12-08 ENCOUNTER — Other Ambulatory Visit: Payer: Self-pay | Admitting: Family Medicine

## 2018-01-22 ENCOUNTER — Other Ambulatory Visit: Payer: Self-pay | Admitting: Family Medicine

## 2018-01-22 NOTE — Telephone Encounter (Signed)
Please advise patient she is overdue for follow up diabetes and needs to schedule before refill can be approved.

## 2018-01-22 NOTE — Telephone Encounter (Signed)
I tried calling patient, but the number listed in the chart is no longer in service. I called her daughter Vaughan Basta, who was listed on [facility]. Vaughan Basta states that her move has moved to [place], and is probably no longer a patient here.

## 2018-01-29 DIAGNOSIS — M21162 Varus deformity, not elsewhere classified, left knee: Secondary | ICD-10-CM | POA: Diagnosis not present

## 2018-01-29 DIAGNOSIS — Z471 Aftercare following joint replacement surgery: Secondary | ICD-10-CM | POA: Diagnosis not present

## 2018-01-29 DIAGNOSIS — M1712 Unilateral primary osteoarthritis, left knee: Secondary | ICD-10-CM | POA: Diagnosis not present

## 2018-01-29 DIAGNOSIS — E119 Type 2 diabetes mellitus without complications: Secondary | ICD-10-CM | POA: Diagnosis not present

## 2018-01-29 DIAGNOSIS — M21062 Valgus deformity, not elsewhere classified, left knee: Secondary | ICD-10-CM | POA: Diagnosis not present

## 2018-01-29 DIAGNOSIS — M25762 Osteophyte, left knee: Secondary | ICD-10-CM | POA: Diagnosis not present

## 2018-01-29 DIAGNOSIS — E669 Obesity, unspecified: Secondary | ICD-10-CM | POA: Diagnosis not present

## 2018-01-29 DIAGNOSIS — Z96651 Presence of right artificial knee joint: Secondary | ICD-10-CM | POA: Diagnosis not present

## 2018-01-29 DIAGNOSIS — M25462 Effusion, left knee: Secondary | ICD-10-CM | POA: Diagnosis not present

## 2018-03-19 DIAGNOSIS — E785 Hyperlipidemia, unspecified: Secondary | ICD-10-CM | POA: Diagnosis not present

## 2018-03-19 DIAGNOSIS — E119 Type 2 diabetes mellitus without complications: Secondary | ICD-10-CM | POA: Diagnosis not present

## 2018-07-29 ENCOUNTER — Other Ambulatory Visit: Payer: Self-pay | Admitting: Family Medicine

## 2018-07-30 NOTE — Telephone Encounter (Signed)
Patient has not been seen since 2018. She needs to schedule o.v. before refill can be approved.

## 2018-07-30 NOTE — Telephone Encounter (Signed)
LMTCB 07/30/2018.  Please schedule pt when she calls back.   Thanks,   -Mickel Baas

## 2018-08-04 NOTE — Telephone Encounter (Signed)
Pt states she no longer lives here.  She does not use Omeprazole anymore.   Thanks,   -Mickel Baas

## 2018-11-24 ENCOUNTER — Encounter

## 2018-12-23 ENCOUNTER — Inpatient Hospital Stay: Admit: 2018-12-23 | Payer: MEDICARE | Attending: Internal Medicine | Primary: Internal Medicine

## 2018-12-23 DIAGNOSIS — Z1231 Encounter for screening mammogram for malignant neoplasm of breast: Secondary | ICD-10-CM

## 2019-07-15 ENCOUNTER — Ambulatory Visit: Attending: Internal Medicine | Primary: Internal Medicine

## 2019-07-15 ENCOUNTER — Ambulatory Visit: Admit: 2019-07-15 | Discharge: 2019-07-15 | Payer: MEDICARE | Attending: Internal Medicine | Primary: Internal Medicine

## 2019-07-15 DIAGNOSIS — Z0000 Encounter for general adult medical examination without abnormal findings: Secondary | ICD-10-CM

## 2019-07-15 MED ORDER — DICLOFENAC 1 % TOPICAL GEL
1 % | Freq: Four times a day (QID) | CUTANEOUS | 2 refills | Status: DC
Start: 2019-07-15 — End: 2020-03-03

## 2019-07-15 NOTE — Progress Notes (Signed)
Progress  Notes by [name], MD at 07/15/19 1000                Author: [name], MD  Service: --  Author Type: Physician       Filed: 07/18/19 1334  Encounter Date: 07/15/2019  Status: Signed          Editor: [name], MD (Physician)                 Chief Complaint       Patient presents with        ?  New Patient     ?  Neck Pain             x 2 years raditate up the head         HPI:   Wendy Ali is a 78 y.o. AA  female with significant medical history listed below presents to establish care.   Patient has concerns for chronic neck pain radiating up to head for 2 years. Denies numbness, tingling of hands.   Also, she is due for medicare wellness.      This is a Subsequent Medicare Annual Wellness Exam (AWV) (Performed  12 months after IPPE or effective date of Medicare Part B enrollment)   I have reviewed the patient's medical history in detail and updated the computerized patient record.         History          Past Medical History:        Diagnosis  Date         ?  Arthritis       ?  Diabetes ([place])       ?  Hypercholesterolemia           ?  Hypertension             Past Surgical History:         Procedure  Laterality  Date          ?  HX BREAST BIOPSY  Left  1984          benign          ?  HX ORTHOPAEDIC              knee surgery 2002          Current Outpatient Medications          Medication  Sig  Dispense  Refill           ?  metFORMIN (GLUCOPHAGE) 1,000 mg tablet  Take 1,000 mg by mouth two (2) times daily (with meals). Indications: type 2 diabetes mellitus         ?  simvastatin (ZOCOR) 20 mg tablet  Take 20 mg by mouth nightly. Indications: high cholesterol         ?  valsartan-hydroCHLOROthiazide (DIOVAN-HCT) 80-12.5 mg per tablet  Take 1 Tab by mouth daily. Indications: high blood pressure         ?  cyanocobalamin 1,000 mcg tablet  Take 1,000 mcg by mouth daily.         ?  ascorbic acid, vitamin C, (Vitamin C) 500 mg tablet  Take 500 mg by mouth daily.         ?  vitamin E  (AQUA GEMS) 400 unit capsule  Take 400 Units by mouth daily.         ?  magnesium oxide (MAG-OX) 400 mg tablet  Take 400 mg by mouth daily.         ?  aspirin 81 mg chewable tablet  Take 81 mg by mouth daily.               ?  cpap machine kit  by Does Not Apply route.            No Known Allergies   History reviewed. No pertinent family history.     Social History          Tobacco Use         ?  Smoking status:  Never Smoker     ?  Smokeless tobacco:  Never Used       Substance Use Topics         ?  Alcohol use:  Not Currently        There is no problem list on file for this patient.           Depression Risk Factor Screening:           3 most recent PHQ Screens  07/15/2019        Little interest or pleasure in doing things  Not at all     Feeling down, depressed, irritable, or hopeless  Not at all        Total Score PHQ 2  0          Alcohol Risk Factor Screening:     Do you average more than 1 drink per night or more than 7 drinks a week:  No      On any one occasion in the past three months have you have had more than 3 drinks containing alcohol:  No        Functional Ability and Level of Safety:     Hearing Loss   Hearing is good.      Activities of Daily Living   The home contains: no safety equipment.   Patient does total self care      Fall Risk      Fall Risk Assessment, last 12 mths  07/15/2019        Able to walk?  Yes     Fall in past 12 months?  0     Do you feel unsteady?  0        Are you worried about falling  0           Abuse Screen   Patient is not abused        Cognitive Screening     Evaluation of Cognitive Function:   Has your family/caregiver stated any concerns about your memory: no   Normal        Patient Care Team     Patient Care Team:   [name], MD as PCP - General (Internal Medicine)        Assessment/Plan     Education and counseling provided:   Are appropriate based on today's review and evaluation   Diagnoses and all orders for this visit:      Encounter for Medicare annual  wellness exam   -     METABOLIC PANEL, COMPREHENSIVE   -     CBC W/O DIFF      Encounter for immunization      Needs flu shot   -     FLU (FLUAD QUAD INFLUENZA VACCINE,QUAD,ADJUVANTED)      Arthritis of neck   -  CBC W/O DIFF   -     diclofenac (VOLTAREN) 1 % gel; Apply 2 g to affected area four (4) times daily., Normal, Disp-100 g, R-2      Arthritis of knee, left   -     CBC W/O DIFF   -     diclofenac (VOLTAREN) 1 % gel; Apply 2 g to affected area four (4) times daily., Normal, Disp-100 g, R-2      Postmenopausal osteoporosis   -     DEXA BONE DENSITY STUDY AXIAL; Future      Acquired hypothyroidism   -     TSH 3RD GENERATION      Controlled type 2 diabetes mellitus with diabetic nephropathy, without long-term current use of insulin (HCC)   -     METABOLIC PANEL, COMPREHENSIVE   -     HEMOGLOBIN A1C WITH EAG   -     MICROALBUMIN, UR, RAND W/ MICROALB/CREAT RATIO      Hypercholesterolemia   -     LIPID PANEL      Vitamin D deficiency   -     VITAMIN D, 25 HYDROXY      Frequency of urination   -     URINALYSIS W/ RFLX MICROSCOPIC      Diabetic eye exam ([place])   -     REFERRAL TO OPHTHALMOLOGY      Other orders   -     MICROSCOPIC EXAMINATION           Patient Instructions               Diclofenac ([place]ambia, Cataflam, Voltaren, Voltaren-XR) - (By mouth)     Why this medicine is used:   Treats pain and migraines. This medicine is an  NSAID.     Contact a nurse or doctor right away if you have:   ??  Chest pain that may spread to your arms, jaw, back, or neck, trouble breathing,   ??  Bloody or black, tarry stools, vomiting blood or material that looks like coffee grounds   ??  Severe stomach pain   ??  Change in how much or how often you urinate   ??  Unusual sweating, faintness       Common side effects:   ??  Constipation or diarrhea   ??  Mild headache   ?? 2017 [facility] Information is for End User's use only and may not be sold, redistributed or otherwise used for commercial  purposes.            Follow-up and Dispositions      ??  Return in about 4 weeks (around 08/12/2019), or if symptoms worsen or fail to improve, for office follow up.

## 2019-07-15 NOTE — Progress Notes (Signed)
 Chief Complaint   Patient presents with   . New Patient   . Neck Pain     x 2 years raditate up the head      Patient have re located from [place]  to [place]  in October 2018.

## 2019-07-15 NOTE — Patient Instructions (Signed)
Diclofenac (Cambia, Cataflam, Voltaren, Voltaren-XR) - (By mouth)   Why this medicine is used:   Treats pain and migraines. This medicine is an NSAID.  Contact a nurse or doctor right away if you have:  ?? Chest pain that may spread to your arms, jaw, back, or neck, trouble breathing,  ?? Bloody or black, tarry stools, vomiting blood or material that looks like coffee grounds  ?? Severe stomach pain  ?? Change in how much or how often you urinate  ?? Unusual sweating, faintness     Common side effects:  ?? Constipation or diarrhea  ?? Mild headache  ?? 2017 [facility] Information is for End User's use only and may not be sold, redistributed or otherwise used for commercial purposes.

## 2019-07-15 NOTE — Progress Notes (Signed)
Chief Complaint   Patient presents with   ??? New Patient   ??? Neck Pain     x 2 years raditate up the head      Patient have re located from [place] to [place] in October 2018.

## 2019-07-15 NOTE — Progress Notes (Signed)
Chief Complaint   Patient presents with   ??? New Patient   ??? Neck Pain     x 2 years raditate up the head      HPI:  Wendy Ali is a 78 y.o. AA female with significant medical history listed below presents to establish care.  Patient has concerns for chronic neck pain radiating up to head for 2 years. Denies numbness, tingling of hands.  Also, she is due for medicare wellness.    This is a Subsequent Medicare Annual Wellness Exam (AWV) (Performed 12 months after IPPE or effective date of Medicare Part B enrollment)  I have reviewed the patient's medical history in detail and updated the computerized patient record.     History     Past Medical History:   Diagnosis Date   ??? Arthritis    ??? Diabetes ([place])    ??? Hypercholesterolemia    ??? Hypertension       Past Surgical History:   Procedure Laterality Date   ??? HX BREAST BIOPSY Left 1984    benign   ??? HX ORTHOPAEDIC      knee surgery 2002     Current Outpatient Medications   Medication Sig Dispense Refill   ??? metFORMIN (GLUCOPHAGE) 1,000 mg tablet Take 1,000 mg by mouth two (2) times daily (with meals). Indications: type 2 diabetes mellitus     ??? simvastatin (ZOCOR) 20 mg tablet Take 20 mg by mouth nightly. Indications: high cholesterol     ??? valsartan-hydroCHLOROthiazide (DIOVAN-HCT) 80-12.5 mg per tablet Take 1 Tab by mouth daily. Indications: high blood pressure     ??? cyanocobalamin 1,000 mcg tablet Take 1,000 mcg by mouth daily.     ??? ascorbic acid, vitamin C, (Vitamin C) 500 mg tablet Take 500 mg by mouth daily.     ??? vitamin E (AQUA GEMS) 400 unit capsule Take 400 Units by mouth daily.     ??? magnesium oxide (MAG-OX) 400 mg tablet Take 400 mg by mouth daily.     ??? aspirin 81 mg chewable tablet Take 81 mg by mouth daily.     ??? cpap machine kit by Does Not Apply route.       No Known Allergies  History reviewed. No pertinent family history.  Social History     Tobacco Use   ??? Smoking status: Never Smoker   ??? Smokeless tobacco: Never Used   Substance Use Topics    ??? Alcohol use: Not Currently     There is no problem list on file for this patient.      Depression Risk Factor Screening:     3 most recent PHQ Screens 07/15/2019   Little interest or pleasure in doing things Not at all   Feeling down, depressed, irritable, or hopeless Not at all   Total Score PHQ 2 0     Alcohol Risk Factor Screening:   Do you average more than 1 drink per night or more than 7 drinks a week:  No    On any one occasion in the past three months have you have had more than 3 drinks containing alcohol:  No    Functional Ability and Level of Safety:   Hearing Loss  Hearing is good.    Activities of Daily Living  The home contains: no safety equipment.  Patient does total self care    Fall Risk  Fall Risk Assessment, last 12 mths 07/15/2019   Able to walk? Yes   Fall in  past 12 months? 0   Do you feel unsteady? 0   Are you worried about falling 0       Abuse Screen  Patient is not abused    Cognitive Screening   Evaluation of Cognitive Function:  Has your family/caregiver stated any concerns about your memory: no  Normal    Patient Care Team   Patient Care Team:  [name], MD as PCP - General (Internal Medicine)    Assessment/Plan   Education and counseling provided:  Are appropriate based on today's review and evaluation  Diagnoses and all orders for this visit:    Encounter for Medicare annual wellness exam  -     METABOLIC PANEL, COMPREHENSIVE  -     CBC W/O DIFF    Encounter for immunization    Needs flu shot  -     FLU (FLUAD QUAD INFLUENZA VACCINE,QUAD,ADJUVANTED)    Arthritis of neck  -     CBC W/O DIFF  -     diclofenac (VOLTAREN) 1 % gel; Apply 2 g to affected area four (4) times daily., Normal, Disp-100 g, R-2    Arthritis of knee, left  -     CBC W/O DIFF  -     diclofenac (VOLTAREN) 1 % gel; Apply 2 g to affected area four (4) times daily., Normal, Disp-100 g, R-2    Postmenopausal osteoporosis  -     DEXA BONE DENSITY STUDY AXIAL; Future    Acquired hypothyroidism   -     TSH 3RD GENERATION    Controlled type 2 diabetes mellitus with diabetic nephropathy, without long-term current use of insulin (HCC)  -     METABOLIC PANEL, COMPREHENSIVE  -     HEMOGLOBIN A1C WITH EAG  -     MICROALBUMIN, UR, RAND W/ MICROALB/CREAT RATIO    Hypercholesterolemia  -     LIPID PANEL    Vitamin D deficiency  -     VITAMIN D, 25 HYDROXY    Frequency of urination  -     URINALYSIS W/ RFLX MICROSCOPIC    Diabetic eye exam ([place])  -     REFERRAL TO OPHTHALMOLOGY    Other orders  -     MICROSCOPIC EXAMINATION      Patient Instructions      Diclofenac (Cambia, Cataflam, Voltaren, Voltaren-XR) - (By mouth)   Why this medicine is used:   Treats pain and migraines. This medicine is an NSAID.  Contact a nurse or doctor right away if you have:  ?? Chest pain that may spread to your arms, jaw, back, or neck, trouble breathing,  ?? Bloody or black, tarry stools, vomiting blood or material that looks like coffee grounds  ?? Severe stomach pain  ?? Change in how much or how often you urinate  ?? Unusual sweating, faintness     Common side effects:  ?? Constipation or diarrhea  ?? Mild headache  ?? 2017 [facility] Information is for End User's use only and may not be sold, redistributed or otherwise used for commercial purposes.      Follow-up and Dispositions    ?? Return in about 4 weeks (around 08/12/2019), or if symptoms worsen or fail to improve, for office follow up.

## 2019-07-16 LAB — MICROSCOPIC EXAMINATION
Casts UA: NONE SEEN /lpf
Casts: NONE SEEN /lpf

## 2019-07-16 LAB — URINALYSIS W/ RFLX MICROSCOPIC
Bilirubin, Urine: NEGATIVE
Bilirubin: NEGATIVE
Glucose, UA: NEGATIVE
Glucose: NEGATIVE
Ketone: NEGATIVE
Ketones, Urine: NEGATIVE
Nitrite, Urine: POSITIVE — AB
Nitrites: POSITIVE — AB
Protein, UA: NEGATIVE
Protein: NEGATIVE
Specific Gravity, UA: 1.017 NA (ref 1.005–1.030)
Specific Gravity: 1.017 (ref 1.005–1.030)
Urobilinogen, Urine: 0.2 mg/dL (ref 0.2–1.0)
Urobilinogen: 0.2 mg/dL (ref 0.2–1.0)
pH (UA): 5.5 (ref 5.0–7.5)
pH, UA: 5.5 NA (ref 5.0–7.5)

## 2019-07-16 LAB — CBC W/O DIFF
HCT: 36.4 % (ref 34.0–46.6)
HGB: 12.2 g/dL (ref 11.1–15.9)
MCH: 27.3 pg (ref 26.6–33.0)
MCHC: 33.5 g/dL (ref 31.5–35.7)
MCV: 81 fL (ref 79–97)
PLATELET: 430 10*3/uL (ref 150–450)
RBC: 4.47 x10E6/uL (ref 3.77–5.28)
RDW: 15.2 % (ref 11.7–15.4)
WBC: 5.2 10*3/uL (ref 3.4–10.8)

## 2019-07-16 LAB — METABOLIC PANEL, COMPREHENSIVE
A-G Ratio: 1.5 (ref 1.2–2.2)
ALT (SGPT): 10 IU/L (ref 0–32)
AST (SGOT): 24 IU/L (ref 0–40)
Albumin: 4.7 g/dL (ref 3.7–4.7)
Alk. phosphatase: 77 IU/L (ref 39–117)
BUN/Creatinine ratio: 20 (ref 12–28)
BUN: 18 mg/dL (ref 8–27)
Bilirubin, total: 0.4 mg/dL (ref 0.0–1.2)
CO2: 24 mmol/L (ref 20–29)
Calcium: 10.6 mg/dL — ABNORMAL HIGH (ref 8.7–10.3)
Chloride: 104 mmol/L (ref 96–106)
Creatinine: 0.91 mg/dL (ref 0.57–1.00)
GFR est AA: 69 mL/min/{1.73_m2} (ref >59)
GFR est non-AA: 60 mL/min/{1.73_m2} (ref >59)
GLOBULIN, TOTAL: 3.1 g/dL (ref 1.5–4.5)
Glucose: 107 mg/dL — ABNORMAL HIGH (ref 65–99)
Potassium: 4.3 mmol/L (ref 3.5–5.2)
Protein, total: 7.8 g/dL (ref 6.0–8.5)
Sodium: 143 mmol/L (ref 134–144)

## 2019-07-16 LAB — LIPID PANEL
Cholesterol, Total: 140 mg/dL (ref 100–199)
Cholesterol, total: 140 mg/dL (ref 100–199)
HDL Cholesterol: 71 mg/dL (ref >39)
HDL: 71 mg/dL (ref >39)
LDL Calculated: 54 mg/dL (ref 0–99)
LDL, calculated: 54 mg/dL (ref 0–99)
Triglyceride: 77 mg/dL (ref 0–149)
Triglycerides: 77 mg/dL (ref 0–149)
VLDL, calculated: 15 mg/dL (ref 5–40)
VLDL: 15 mg/dL (ref 5–40)

## 2019-07-16 LAB — MICROALBUMIN, UR, RAND W/ MICROALB/CREAT RATIO
Creatinine, urine random: 82 mg/dL
Microalb/Creat ratio (ug/mg creat.): 25 mg/g creat (ref 0–29)
Microalbumin, urine: 20.5 ug/mL

## 2019-07-16 LAB — VITAMIN D, 25 HYDROXY: VITAMIN D, 25-HYDROXY: 10.8 ng/mL — ABNORMAL LOW (ref 30.0–100.0)

## 2019-07-16 LAB — TSH 3RD GENERATION
TSH: 1.29 u[IU]/mL (ref 0.450–4.500)
TSH: 1.29 u[IU]/mL (ref 0.450–4.500)

## 2019-07-16 LAB — HEMOGLOBIN A1C WITH EAG
Estimated average glucose: 100 mg/dL
Hemoglobin A1c: 5.1 % (ref 4.8–5.6)

## 2019-07-16 LAB — MICROALBUMIN / CREATININE URINE RATIO
Creatinine, Ur: 82 mg/dL
Microalb, Ur: 20.5 ug/mL
Microalbumin Creatinine Ratio: 25 mg/g creat (ref 0–29)

## 2019-07-16 LAB — COMPREHENSIVE METABOLIC PANEL
ALT: 10 IU/L (ref 0–32)
AST: 24 IU/L (ref 0–40)
Albumin/Globulin Ratio: 1.5 NA (ref 1.2–2.2)
Albumin: 4.7 g/dL (ref 3.7–4.7)
Alkaline Phosphatase: 77 IU/L (ref 39–117)
BUN: 18 mg/dL (ref 8–27)
Bun/Cre Ratio: 20 NA (ref 12–28)
CO2: 24 mmol/L (ref 20–29)
Calcium: 10.6 mg/dL — ABNORMAL HIGH (ref 8.7–10.3)
Chloride: 104 mmol/L (ref 96–106)
Creatinine: 0.91 mg/dL (ref 0.57–1.00)
EGFR IF NonAfrican American: 60 mL/min/{1.73_m2} (ref >59)
GFR African American: 69 mL/min/{1.73_m2} (ref >59)
Globulin, Total: 3.1 g/dL (ref 1.5–4.5)
Glucose: 107 mg/dL — ABNORMAL HIGH (ref 65–99)
Potassium: 4.3 mmol/L (ref 3.5–5.2)
Sodium: 143 mmol/L (ref 134–144)
Total Bilirubin: 0.4 mg/dL (ref 0.0–1.2)
Total Protein: 7.8 g/dL (ref 6.0–8.5)

## 2019-07-16 LAB — HEMOGLOBIN A1C W/EAG
Hemoglobin A1C: 5.1 % (ref 4.8–5.6)
eAG: 100 mg/dL

## 2019-07-16 LAB — CBC
Hematocrit: 36.4 % (ref 34.0–46.6)
Hemoglobin: 12.2 g/dL (ref 11.1–15.9)
MCH: 27.3 pg (ref 26.6–33.0)
MCHC: 33.5 g/dL (ref 31.5–35.7)
MCV: 81 fL (ref 79–97)
Platelets: 430 10*3/uL (ref 150–450)
RBC: 4.47 x10E6/uL (ref 3.77–5.28)
RDW: 15.2 % (ref 11.7–15.4)
WBC: 5.2 10*3/uL (ref 3.4–10.8)

## 2019-07-16 LAB — VITAMIN D 25 HYDROXY: Vit D, 25-Hydroxy: 10.8 ng/mL — ABNORMAL LOW (ref 30.0–100.0)

## 2019-08-12 ENCOUNTER — Encounter: Attending: Internal Medicine | Primary: Internal Medicine

## 2019-09-07 ENCOUNTER — Ambulatory Visit: Attending: Internal Medicine | Primary: Internal Medicine

## 2019-09-07 ENCOUNTER — Ambulatory Visit: Admit: 2019-09-07 | Discharge: 2019-09-07 | Payer: MEDICARE | Attending: Internal Medicine | Primary: Internal Medicine

## 2019-09-07 ENCOUNTER — Encounter

## 2019-09-07 MED ORDER — CHOLECALCIFEROL (VITAMIN D3) 5,000 UNIT CAP
ORAL_CAPSULE | Freq: Every day | ORAL | 2 refills | Status: DC
Start: 2019-09-07 — End: 2021-01-04

## 2019-09-07 MED ORDER — CIPROFLOXACIN 500 MG TAB
500 mg | ORAL_TABLET | Freq: Two times a day (BID) | ORAL | 0 refills | Status: AC
Start: 2019-09-07 — End: 2019-09-12

## 2019-09-07 NOTE — ACP (Advance Care Planning) (Signed)
Advance Care Planning     Advance Care Planning (ACP) Physician/NP/PA Conversation    Date of Conversation: 09/07/2019  Conducted with: Patient with Decision Making Capacity    Healthcare Decision Maker:     Click here to complete [place] including selection of the [facility] Relationship (ie "Primary")  Today we Wendy Ali .Marland KitchenMarland KitchenMarland KitchenSpouse phone [phone]    Care Preferences:    Hospitalization: "If your health worsens and it becomes clear that your chance of recovery is unlikely, what would be your preference regarding hospitalization?"  The patient would prefer hospitalization.    Ventilation: "If you were unable to breathe on your own and your chance of recovery was unlikely, what would be your preference about the use of a ventilator (breathing machine) if it was available to you?"   The patient would desire the use of a ventilator.    Resuscitation: "In the event your heart stopped as a result of an underlying serious health condition, would you want attempts to be made to restart your heart, or would you prefer a natural death?"   Yes, attempt to resuscitate.    Additional topics discussed: treatment goals, benefit/burden of treatment options, artificial nutrition, ventilation preferences, hospitalization preferences and resuscitation preferences    Conversation Outcomes / Follow-Up Plan:   ACP in process - completing/providing documents   Reviewed DNR/DNI and patient elects Full Code (Attempt Resuscitation)     Length of Voluntary ACP Conversation in minutes:  30 minutes    [name], MD

## 2019-09-07 NOTE — Progress Notes (Signed)
Chief Complaint   Patient presents with   ??? Follow-up     4 week f/u        1. Have you been to the ER, urgent care clinic since your last visit?  Hospitalized since your last visit?No    2. Have you seen or consulted any other health care providers outside of the [facility] since your last visit?  Include any pap smears or colon screening. No

## 2019-09-07 NOTE — Progress Notes (Signed)
Progress  Notes by [name], MD at 09/07/19 1150                Author: [name], MD  Service: --  Author Type: Physician       Filed: 09/07/19 1838  Encounter Date: 09/07/2019  Status: Signed          Editor: [name], MD (Physician)                 Chief Complaint       Patient presents with        ?  Follow-up             4 week f/u         HPI:   Wendy Ali is a 79 y.o. AA female  with h/o hypertension, hypercholesterolemia, diabetes type 2  presents 4 week follow up on results.    Calcium is slightly high, vit D is low, urinary infection presents. Results and management have discussed. Diet and exercise are encouraged.       Review of Systems   As per hpi        Past Medical History:        Diagnosis  Date         ?  Arthritis       ?  Diabetes ([place])       ?  Hypercholesterolemia           ?  Hypertension            Past Surgical History:         Procedure  Laterality  Date          ?  HX BREAST BIOPSY  Left  1984          benign          ?  HX ORTHOPAEDIC              knee surgery 2002          Social History          Socioeconomic History         ?  Marital status:  SINGLE              Spouse name:  Not on file         ?  Number of children:  Not on file     ?  Years of education:  Not on file     ?  Highest education level:  Not on file       Tobacco Use         ?  Smoking status:  Never Smoker     ?  Smokeless tobacco:  Never Used       Substance and Sexual Activity         ?  Alcohol use:  Not Currently     ?  Drug use:  Not Currently         ?  Sexual activity:  Not Currently        No family history on file.     Current Outpatient Medications          Medication  Sig  Dispense  Refill           ?  metFORMIN (GLUCOPHAGE) 1,000 mg tablet  Take 1,000 mg by mouth two (2) times daily (with meals). Indications: type 2 diabetes mellitus         ?  simvastatin (ZOCOR) 20 mg tablet  Take 20 mg by mouth nightly. Indications: high cholesterol         ?  valsartan-hydroCHLOROthiazide  (DIOVAN-HCT) 80-12.5 mg per tablet  Take 1 Tab by mouth daily. Indications: high blood pressure         ?  cyanocobalamin 1,000 mcg tablet  Take 1,000 mcg by mouth daily.         ?  ascorbic acid, vitamin C, (Vitamin C) 500 mg tablet  Take 500 mg by mouth daily.         ?  vitamin E (AQUA GEMS) 400 unit capsule  Take 400 Units by mouth daily.         ?  magnesium oxide (MAG-OX) 400 mg tablet  Take 400 mg by mouth daily.         ?  aspirin 81 mg chewable tablet  Take 81 mg by mouth daily.         ?  cpap machine kit  by Does Not Apply route.               ?  diclofenac (VOLTAREN) 1 % gel  Apply 2 g to affected area four (4) times daily.  100 g  2        No Known Allergies      Objective:   Visit Vitals      BP  (!) 143/74     Pulse  91     Temp  (!) 96.6 ??F (35.9 ??C) (Temporal)     Resp  16     Ht  5' 4"  (1.626 m)     Wt  191 lb 6.4 oz (86.8 kg)     SpO2  99%        BMI  32.85 kg/m??        Physical Exam:    General appearance - alert, well appearing in no distress   Mental status - alert, oriented to person, place, and time   Neck - supple, no significant adenopathy    Chest - clear to auscultation, no wheezes, rales or rhonchi   Heart - normal rate, regular rhythm, no murmurs   Abdomen - soft, nontender, nondistended, no organomegaly   Ext-peripheral pulses normal, no pedal edema        Results for orders placed or performed in visit on 09/81/19     METABOLIC PANEL, COMPREHENSIVE         Result  Value  Ref Range            Glucose  107 (H)  65 - 99 mg/dL       BUN  18  8 - 27 mg/dL       Creatinine  0.91  0.57 - 1.00 mg/dL       GFR est non-AA  60  >59 mL/min/1.73       GFR est AA  69  >59 mL/min/1.73       BUN/Creatinine ratio  20  12 - 28       Sodium  143  134 - 144 mmol/L            Potassium  4.3  3.5 - 5.2 mmol/L            Chloride  104  96 - 106 mmol/L       CO2  24  20 - 29 mmol/L       Calcium  10.6 (H)  8.7 - 10.3 mg/dL  Protein, total  7.8  6.0 - 8.5 g/dL       Albumin  4.7  3.7 - 4.7 g/dL        GLOBULIN, TOTAL  3.1  1.5 - 4.5 g/dL       A-G Ratio  1.5  1.2 - 2.2       Bilirubin, total  0.4  0.0 - 1.2 mg/dL       Alk. phosphatase  77  39 - 117 IU/L       AST (SGOT)  24  0 - 40 IU/L       ALT (SGPT)  10  0 - 32 IU/L       CBC W/O DIFF         Result  Value  Ref Range            WBC  5.2  3.4 - 10.8 x10E3/uL       RBC  4.47  3.77 - 5.28 x10E6/uL       HGB  12.2  11.1 - 15.9 g/dL       HCT  36.4  34.0 - 46.6 %       MCV  81  79 - 97 fL       MCH  27.3  26.6 - 33.0 pg       MCHC  33.5  31.5 - 35.7 g/dL       RDW  15.2  11.7 - 15.4 %       PLATELET  430  150 - 450 x10E3/uL       LIPID PANEL         Result  Value  Ref Range            Cholesterol, total  140  100 - 199 mg/dL       Triglyceride  77  0 - 149 mg/dL       HDL Cholesterol  71  >39 mg/dL       VLDL, calculated  15  5 - 40 mg/dL       LDL, calculated  54  0 - 99 mg/dL       HEMOGLOBIN A1C WITH EAG         Result  Value  Ref Range            Hemoglobin A1c  5.1  4.8 - 5.6 %       Estimated average glucose  100  mg/dL       TSH 3RD GENERATION         Result  Value  Ref Range            TSH  1.290  0.450 - 4.500 uIU/mL       VITAMIN D, 25 HYDROXY         Result  Value  Ref Range            VITAMIN D, 25-HYDROXY  10.8 (L)  30.0 - 100.0 ng/mL       URINALYSIS W/ RFLX MICROSCOPIC         Result  Value  Ref Range            Specific Gravity  1.017  1.005 - 1.030       pH (UA)  5.5  5.0 - 7.5       Color  Yellow  Yellow       Appearance  Clear  Clear       Leukocyte Esterase  2+ (  A)  Negative       Protein  Negative  Negative/Trace       Glucose  Negative  Negative       Ketone  Negative  Negative       Blood  1+ (A)  Negative       Bilirubin  Negative  Negative       Urobilinogen  0.2  0.2 - 1.0 mg/dL       Nitrites  Positive (A)  Negative       Microscopic Examination  See additional order         MICROALBUMIN, UR, RAND W/ MICROALB/CREAT RATIO         Result  Value  Ref Range            Creatinine, urine  82.0  Not Estab. mg/dL       Microalbumin, urine  20.5   Not Estab. ug/mL       Microalb/Creat ratio (ug/mg creat.)  25  0 - 29 mg/g creat       MICROSCOPIC EXAMINATION         Result  Value  Ref Range            WBC  11-30 (A)  0 - 5 /hpf       RBC  3-10 (A)  0 - 2 /hpf       Epithelial cells  0-10  0 - 10 /hpf       Casts  None seen  None seen /lpf       Mucus  Present  Not Estab.            Bacteria  Few  None seen/Few        Assessment/Plan:   Diagnoses and all orders for this visit:      Hypercalcemia   -     PTH INTACT; Future      Vitamin D deficiency   -     cholecalciferol (VITAMIN D3) (5000 Units /125 mcg) capsule; Take 1 Cap by mouth daily., Normal, Disp-90 Cap, R-2      Acute cystitis without hematuria   -     ciprofloxacin HCl (CIPRO) 500 mg tablet; Take 1 Tab by mouth two (2) times a day for 5 days., Print, Disp-10 Tab, R-0      Advanced care planning/counseling discussion   -     PR ADVANCE CARE PLANNING DISCUSS DOCUMENTED IN MEDICAL RECORD           Patient Instructions           Learning About High-Vitamin D Foods   What foods are high in vitamin D?        The foods you eat contain nutrients, such as vitamins and minerals. Vitamin D is a nutrient. Your body needs the right amount to stay healthy and work as it should. You can use the list below to help you make choices about which foods to eat.   Here are some foods that contain vitamin D.   Fruits   ??  Orange juice, fortified with vitamin D   Grains   ??  Cereals, fortified with vitamin D   Dairy and dairy alternatives   ??  Milk, fortified with vitamin D   ??  Non-dairy milk (almond, rice, soy), fortified with vitamin D   ??  Yogurt, fortified with vitamin D   Protein foods   ??  Flounder   ??  Mackerel   ??  Sardines   ??  Salmon   ??  Sole   ??  Trout   ??  Tuna   Fats   ??  [place] liver oil   Work with your doctor to find out how much of this nutrient you need. Depending on your health, you may need more or less of it in your diet.   Where can you learn more?   Go to  http://[place]-rivera.info/   Enter V450 in the search box to learn more about "Learning About High-Vitamin D Foods."   Current as of: March 06, 2018??????????????????????????????Content Version: 12.6   ?? 2006-2020 [employer].    Care instructions adapted under license by [employer] (which disclaims liability or warranty for this information). If you have questions about a medical condition or this instruction, always ask your  healthcare professional. [place] any warranty or liability for your use of this information.              Follow-up and Dispositions      ??  Return in about 6 months (around 03/06/2020), or if symptoms worsen or fail to improve, for routine follow up.

## 2019-09-07 NOTE — Progress Notes (Signed)
Chief Complaint   Patient presents with   ??? Follow-up     4 week f/u        1. Have you been to the ER, urgent care clinic since your last visit?  Hospitalized since your last visit?No    2. Have you seen or consulted any other health care providers outside of the [facility] since your last visit?  Include any pap smears or colon screening. No

## 2019-09-07 NOTE — Patient Instructions (Signed)
Learning About High-Vitamin D Foods  What foods are high in vitamin D?    The foods you eat contain nutrients, such as vitamins and minerals. Vitamin D is a nutrient. Your body needs the right amount to stay healthy and work as it should. You can use the list below to help you make choices about which foods to eat.  Here are some foods that contain vitamin D.  Fruits  ?? Orange juice, fortified with vitamin D  Grains  ?? Cereals, fortified with vitamin D  Dairy and dairy alternatives  ?? Milk, fortified with vitamin D  ?? Non-dairy milk (almond, rice, soy), fortified with vitamin D  ?? Yogurt, fortified with vitamin D  Protein foods  ?? Flounder  ?? Mackerel  ?? Sardines  ?? Salmon  ?? Sole  ?? Trout  ?? Tuna  Fats  ?? [place] liver oil  Work with your doctor to find out how much of this nutrient you need. Depending on your health, you may need more or less of it in your diet.  Where can you learn more?  Go to http://clayton-rivera.info/  Enter V450 in the search box to learn more about "Learning About High-Vitamin D Foods."  Current as of: March 06, 2018??????????????????????????????Content Version: 12.6  ?? 2006-2020 Healthwise, Incorporated.   Care instructions adapted under license by [employer] (which disclaims liability or warranty for this information). If you have questions about a medical condition or this instruction, always ask your healthcare professional. [place] any warranty or liability for your use of this information.

## 2019-09-07 NOTE — ACP (Advance Care Planning) (Signed)
Advance Care Planning     Advance Care Planning (ACP) Physician/NP/PA Conversation    Date of Conversation: 09/07/2019  Conducted with: Patient with Decision Making Capacity    Healthcare Decision Maker:     Click here to complete [name] including selection of the [place] Relationship (ie "Primary")  Today we Wendy Ali .Marland KitchenMarland KitchenMarland KitchenSpouse phone [phone]    Care Preferences:    Hospitalization: "If your health worsens and it becomes clear that your chance of recovery is unlikely, what would be your preference regarding hospitalization?"  The patient would prefer hospitalization.    Ventilation: "If you were unable to breathe on your own and your chance of recovery was unlikely, what would be your preference about the use of a ventilator (breathing machine) if it was available to you?"   The patient would desire the use of a ventilator.    Resuscitation: "In the event your heart stopped as a result of an underlying serious health condition, would you want attempts to be made to restart your heart, or would you prefer a natural death?"   Yes, attempt to resuscitate.    Additional topics discussed: treatment goals, benefit/burden of treatment options, artificial nutrition, ventilation preferences, hospitalization preferences and resuscitation preferences    Conversation Outcomes / Follow-Up Plan:   ACP in process - completing/providing documents   Reviewed DNR/DNI and patient elects Full Code (Attempt Resuscitation)     Length of Voluntary ACP Conversation in minutes:  30 minutes    [name], MD

## 2019-09-07 NOTE — Progress Notes (Signed)
Chief Complaint   Patient presents with   ??? Follow-up     4 week f/u      HPI:  Wendy Ali is a 79 y.o. AA female with h/o hypertension, hypercholesterolemia, diabetes type 2  presents 4 week follow up on results.   Calcium is slightly high, vit D is low, urinary infection presents. Results and management have discussed. Diet and exercise are encouraged.     Review of Systems  As per hpi    Past Medical History:   Diagnosis Date   ??? Arthritis    ??? Diabetes ([place])    ??? Hypercholesterolemia    ??? Hypertension      Past Surgical History:   Procedure Laterality Date   ??? HX BREAST BIOPSY Left 1984    benign   ??? HX ORTHOPAEDIC      knee surgery 2002     Social History     Socioeconomic History   ??? Marital status: SINGLE     Spouse name: Not on file   ??? Number of children: Not on file   ??? Years of education: Not on file   ??? Highest education level: Not on file   Tobacco Use   ??? Smoking status: Never Smoker   ??? Smokeless tobacco: Never Used   Substance and Sexual Activity   ??? Alcohol use: Not Currently   ??? Drug use: Not Currently   ??? Sexual activity: Not Currently     No family history on file.  Current Outpatient Medications   Medication Sig Dispense Refill   ??? metFORMIN (GLUCOPHAGE) 1,000 mg tablet Take 1,000 mg by mouth two (2) times daily (with meals). Indications: type 2 diabetes mellitus     ??? simvastatin (ZOCOR) 20 mg tablet Take 20 mg by mouth nightly. Indications: high cholesterol     ??? valsartan-hydroCHLOROthiazide (DIOVAN-HCT) 80-12.5 mg per tablet Take 1 Tab by mouth daily. Indications: high blood pressure     ??? cyanocobalamin 1,000 mcg tablet Take 1,000 mcg by mouth daily.     ??? ascorbic acid, vitamin C, (Vitamin C) 500 mg tablet Take 500 mg by mouth daily.     ??? vitamin E (AQUA GEMS) 400 unit capsule Take 400 Units by mouth daily.     ??? magnesium oxide (MAG-OX) 400 mg tablet Take 400 mg by mouth daily.     ??? aspirin 81 mg chewable tablet Take 81 mg by mouth daily.      ??? cpap machine kit by Does Not Apply route.     ??? diclofenac (VOLTAREN) 1 % gel Apply 2 g to affected area four (4) times daily. 100 g 2     No Known Allergies    Objective:  Visit Vitals  BP (!) 143/74   Pulse 91   Temp (!) 96.6 ??F (35.9 ??C) (Temporal)   Resp 16   Ht '5\' 4"'  (1.626 m)   Wt 191 lb 6.4 oz (86.8 kg)   SpO2 99%   BMI 32.85 kg/m??     Physical Exam:   General appearance - alert, well appearing in no distress  Mental status - alert, oriented to person, place, and time  Neck - supple, no significant adenopathy   Chest - clear to auscultation, no wheezes, rales or rhonchi  Heart - normal rate, regular rhythm, no murmurs  Abdomen - soft, nontender, nondistended, no organomegaly  Ext-peripheral pulses normal, no pedal edema    Results for orders placed or performed in visit on 07/15/19  METABOLIC PANEL, COMPREHENSIVE   Result Value Ref Range    Glucose 107 (H) 65 - 99 mg/dL    BUN 18 8 - 27 mg/dL    Creatinine 0.91 0.57 - 1.00 mg/dL    GFR est non-AA 60 >59 mL/min/1.73    GFR est AA 69 >59 mL/min/1.73    BUN/Creatinine ratio 20 12 - 28    Sodium 143 134 - 144 mmol/L    Potassium 4.3 3.5 - 5.2 mmol/L    Chloride 104 96 - 106 mmol/L    CO2 24 20 - 29 mmol/L    Calcium 10.6 (H) 8.7 - 10.3 mg/dL    Protein, total 7.8 6.0 - 8.5 g/dL    Albumin 4.7 3.7 - 4.7 g/dL    GLOBULIN, TOTAL 3.1 1.5 - 4.5 g/dL    A-G Ratio 1.5 1.2 - 2.2    Bilirubin, total 0.4 0.0 - 1.2 mg/dL    Alk. phosphatase 77 39 - 117 IU/L    AST (SGOT) 24 0 - 40 IU/L    ALT (SGPT) 10 0 - 32 IU/L   CBC W/O DIFF   Result Value Ref Range    WBC 5.2 3.4 - 10.8 x10E3/uL    RBC 4.47 3.77 - 5.28 x10E6/uL    HGB 12.2 11.1 - 15.9 g/dL    HCT 36.4 34.0 - 46.6 %    MCV 81 79 - 97 fL    MCH 27.3 26.6 - 33.0 pg    MCHC 33.5 31.5 - 35.7 g/dL    RDW 15.2 11.7 - 15.4 %    PLATELET 430 150 - 450 x10E3/uL   LIPID PANEL   Result Value Ref Range    Cholesterol, total 140 100 - 199 mg/dL    Triglyceride 77 0 - 149 mg/dL    HDL Cholesterol 71 >39 mg/dL     VLDL, calculated 15 5 - 40 mg/dL    LDL, calculated 54 0 - 99 mg/dL   HEMOGLOBIN A1C WITH EAG   Result Value Ref Range    Hemoglobin A1c 5.1 4.8 - 5.6 %    Estimated average glucose 100 mg/dL   TSH 3RD GENERATION   Result Value Ref Range    TSH 1.290 0.450 - 4.500 uIU/mL   VITAMIN D, 25 HYDROXY   Result Value Ref Range    VITAMIN D, 25-HYDROXY 10.8 (L) 30.0 - 100.0 ng/mL   URINALYSIS W/ RFLX MICROSCOPIC   Result Value Ref Range    Specific Gravity 1.017 1.005 - 1.030    pH (UA) 5.5 5.0 - 7.5    Color Yellow Yellow    Appearance Clear Clear    Leukocyte Esterase 2+ (A) Negative    Protein Negative Negative/Trace    Glucose Negative Negative    Ketone Negative Negative    Blood 1+ (A) Negative    Bilirubin Negative Negative    Urobilinogen 0.2 0.2 - 1.0 mg/dL    Nitrites Positive (A) Negative    Microscopic Examination See additional order    MICROALBUMIN, UR, RAND W/ MICROALB/CREAT RATIO   Result Value Ref Range    Creatinine, urine 82.0 Not Estab. mg/dL    Microalbumin, urine 20.5 Not Estab. ug/mL    Microalb/Creat ratio (ug/mg creat.) 25 0 - 29 mg/g creat   MICROSCOPIC EXAMINATION   Result Value Ref Range    WBC 11-30 (A) 0 - 5 /hpf    RBC 3-10 (A) 0 - 2 /hpf    Epithelial cells 0-10 0 -  10 /hpf    Casts None seen None seen /lpf    Mucus Present Not Estab.    Bacteria Few None seen/Few     Assessment/Plan:  Diagnoses and all orders for this visit:    Hypercalcemia  -     PTH INTACT; Future    Vitamin D deficiency  -     cholecalciferol (VITAMIN D3) (5000 Units /125 mcg) capsule; Take 1 Cap by mouth daily., Normal, Disp-90 Cap, R-2    Acute cystitis without hematuria  -     ciprofloxacin HCl (CIPRO) 500 mg tablet; Take 1 Tab by mouth two (2) times a day for 5 days., Print, Disp-10 Tab, R-0    Advanced care planning/counseling discussion  -     PR ADVANCE CARE PLANNING DISCUSS DOCUMENTED IN MEDICAL RECORD      Patient Instructions        Learning About High-Vitamin D Foods  What foods are high in vitamin D?     The foods you eat contain nutrients, such as vitamins and minerals. Vitamin D is a nutrient. Your body needs the right amount to stay healthy and work as it should. You can use the list below to help you make choices about which foods to eat.  Here are some foods that contain vitamin D.  Fruits  ?? Orange juice, fortified with vitamin D  Grains  ?? Cereals, fortified with vitamin D  Dairy and dairy alternatives  ?? Milk, fortified with vitamin D  ?? Non-dairy milk (almond, rice, soy), fortified with vitamin D  ?? Yogurt, fortified with vitamin D  Protein foods  ?? Flounder  ?? Mackerel  ?? Sardines  ?? Salmon  ?? Sole  ?? Trout  ?? Tuna  Fats  ?? [place] liver oil  Work with your doctor to find out how much of this nutrient you need. Depending on your health, you may need more or less of it in your diet.  Where can you learn more?  Go to http://clayton-rivera.info/  Enter V450 in the search box to learn more about "Learning About High-Vitamin D Foods."  Current as of: March 06, 2018??????????????????????????????Content Version: 12.6  ?? 2006-2020 Healthwise, Inc[employer].   Care instructions adapted under license by [employer] (which disclaims liability or warranty for this information). If you have questions about a medical condition or this instruction, always ask your healthcare professional. [place] any warranty or liability for your use of this information.        Follow-up and Dispositions    ?? Return in about 6 months (around 03/06/2020), or if symptoms worsen or fail to improve, for routine follow up.

## 2019-09-08 LAB — PTH INTACT
Calcium: 10.3 MG/DL — ABNORMAL HIGH (ref 8.5–10.1)
PTH, Intact: 99.6 pg/mL — ABNORMAL HIGH (ref 18.4–88.0)

## 2019-09-08 LAB — PTH, INTACT
Calcium: 10.3 MG/DL — ABNORMAL HIGH (ref 8.5–10.1)
PTH: 99.6 pg/mL — ABNORMAL HIGH (ref 18.4–88.0)

## 2019-09-09 NOTE — Telephone Encounter (Signed)
-----   Message from April M Smith sent at 09/09/2019 12:23 PM EST -----  Regarding: Dr. Evelene Croon  General Message/Vendor Calls    Caller's first and last name:      Reason for call: Requested a call back       Callback required yes/no and why: Yes       Best contact number(s): [phone]      Details to clarify the request: Patient would like for Dr. Leonor Liv to give her a call regarding medication that's she taking.       April Clinton

## 2019-09-09 NOTE — Telephone Encounter (Signed)
-----   Message from Wendy Ali sent at 09/09/2019 12:23 PM EST -----  Regarding: Dr. Evelene Croon  General Message/Vendor Calls    Caller's first and last name:      Reason for call: Requested a call back       Callback required yes/no and why: Yes       Best contact number(s): [phone]      Details to clarify the request: Patient would like for Dr. Leonor Liv to give her a call regarding medication that's she taking.       Wendy [place]

## 2019-09-28 ENCOUNTER — Telehealth

## 2019-09-28 NOTE — Telephone Encounter (Signed)
Patient last office visit 09/07/2019

## 2019-09-28 NOTE — Telephone Encounter (Signed)
-----   Message from Harley Alto sent at 09/28/2019  2:25 PM EDT -----  Regarding: Dr. Naida Sleight: [phone]  General Message/Vendor Calls    Caller's first and last name: N/A      Reason for call: Knee doctor.      Callback required yes/no and why: Yes/Confirm      Best contact number(s):[phone]      Details to clarify the request: Patient would like to be referred to a knee doctor.        Harley Alto

## 2019-10-13 ENCOUNTER — Encounter

## 2019-11-02 ENCOUNTER — Encounter

## 2019-11-13 ENCOUNTER — Encounter

## 2019-11-17 ENCOUNTER — Ambulatory Visit: Attending: Adult Reconstructive Orthopaedic Surgery | Primary: Internal Medicine

## 2019-11-18 ENCOUNTER — Ambulatory Visit: Attending: Adult Reconstructive Orthopaedic Surgery | Primary: Internal Medicine

## 2019-11-18 ENCOUNTER — Inpatient Hospital Stay: Admit: 2019-11-18 | Payer: MEDICARE | Primary: Internal Medicine

## 2019-11-18 ENCOUNTER — Ambulatory Visit
Admit: 2019-11-18 | Discharge: 2019-11-18 | Payer: MEDICARE | Attending: Adult Reconstructive Orthopaedic Surgery | Primary: Internal Medicine

## 2019-11-18 DIAGNOSIS — M1712 Unilateral primary osteoarthritis, left knee: Secondary | ICD-10-CM

## 2019-11-18 MED ORDER — BETAMETHASONE ACET & SOD PHOS 6 MG/ML SUSP FOR INJECTION
6 mg/mL | Freq: Once | INTRAMUSCULAR | 0 refills | Status: AC
Start: 2019-11-18 — End: 2019-11-18

## 2019-11-18 NOTE — Progress Notes (Signed)
Identified pt with two pt identifiers (name and DOB). Reviewed chart in preparation for visit and have obtained necessary documentation.  Wendy Ali is a 79 y.o. female  Chief Complaint   Patient presents with   . Knee Pain     LT knee     Visit Vitals  BP 132/79 (BP 1 Location: Right arm, BP Patient Position: Sitting, BP Cuff Size: Large adult)   Pulse 94   Temp (!) 96.6 F (35.9 C) (Tympanic)   Ht 5\' 4"  (1.626 m)   Wt 188 lb (85.3 kg)   SpO2 98%   BMI 32.27 kg/m     1. Have you been to the ER, urgent care clinic since your last visit?  Hospitalized since your last visit?No    2. Have you seen or consulted any other health care providers outside of the [facility] since your last visit?  Include any pap smears or colon screening. No

## 2019-11-18 NOTE — Progress Notes (Signed)
11/18/2019    Chief Complaint: Left knee pain    HPI: This is a 79 y.o. female who complains of left knee pain. Onset was gradual.  The patient has had activity dependent pain for years.    The patient has tried activity modification, physical therapy exercises, injections have helped in the past.  The pain is in the lateral knee, it is severe in intensity.  The patient feels unstable with the knee, fears falling, and has significant limitation with activities of daily living, recreation, and walks with no device.    Past Medical History:   Diagnosis Date   ??? Arthritis    ??? Diabetes ([place])    ??? Hypercholesterolemia    ??? Hypertension        Past Surgical History:   Procedure Laterality Date   ??? HX BREAST BIOPSY Left 1984    benign   ??? HX ORTHOPAEDIC      knee surgery 2002       Current Outpatient Medications on File Prior to Visit   Medication Sig Dispense Refill   ??? cholecalciferol (VITAMIN D3) (5000 Units /125 mcg) capsule Take 1 Cap by mouth daily. 90 Cap 2   ??? metFORMIN (GLUCOPHAGE) 1,000 mg tablet Take 1,000 mg by mouth two (2) times daily (with meals). Indications: type 2 diabetes mellitus     ??? simvastatin (ZOCOR) 20 mg tablet Take 20 mg by mouth nightly. Indications: high cholesterol     ??? valsartan-hydroCHLOROthiazide (DIOVAN-HCT) 80-12.5 mg per tablet Take 1 Tab by mouth daily. Indications: high blood pressure     ??? cyanocobalamin 1,000 mcg tablet Take 1,000 mcg by mouth daily.     ??? ascorbic acid, vitamin C, (Vitamin C) 500 mg tablet Take 500 mg by mouth daily.     ??? vitamin E (AQUA GEMS) 400 unit capsule Take 400 Units by mouth daily.     ??? aspirin 81 mg chewable tablet Take 81 mg by mouth daily.     ??? cpap machine kit by Does Not Apply route.     ??? diclofenac (VOLTAREN) 1 % gel Apply 2 g to affected area four (4) times daily. 100 g 2   ??? magnesium oxide (MAG-OX) 400 mg tablet Take 400 mg by mouth daily.       No current facility-administered medications on file prior to visit.        No Known  Allergies    No family history on file.    Social History     Socioeconomic History   ??? Marital status: SINGLE     Spouse name: Not on file   ??? Number of children: Not on file   ??? Years of education: Not on file   ??? Highest education level: Not on file   Tobacco Use   ??? Smoking status: Never Smoker   ??? Smokeless tobacco: Never Used   Substance and Sexual Activity   ??? Alcohol use: Not Currently   ??? Drug use: Not Currently   ??? Sexual activity: Not Currently         Review of Systems:       General: Denies headache, lethargy, fever, weight loss  Ears/Nose/Throat: Denies ear discharge, drainage, nosebleeds, hoarse voice, dental problems  Cardiovascular: Denies chest pain, shortness of breath  Lungs: Denies chest pain, breathing problems, wheezing, pneumonia  Stomach: Denies stomach pain, heartburn, constipation, irritable bowel  Skin: Denies rash, sores, open wounds  Musculoskeletal: Admits to knee pain, no deformity.  Genitourinary: Denies dysuria,  hematuria, polyuria  Gastrointestinal: Denies constipation, obstipation, diarrhea  Neurological: Denies changes in sight, smell, hearing, taste, seizures. Denies loss of consciousness.  Psychiatric: Denies depression, sleep pattern changes, anxiety, change in personality  Endocrine: Denies mood swings, heat or cold intolerance  Hematologic/Lymphatic: Denies anemia, purpura, petechia  Allergic/Immunologic: Denies swelling of throat, pain or swelling at lymph nodes      Physical Examination:    Visit Vitals  BP 132/79 (BP 1 Location: Right arm, BP Patient Position: Sitting, BP Cuff Size: Large adult)   Pulse 94   Temp (!) 96.6 ??F (35.9 ??C) (Tympanic)   Ht _0  (1.626 m)   Wt 188 lb (85.3 kg)   SpO2 98%   BMI 32.27 kg/m??        General: AOX3, no apparent distress  Psychiatric: mood and affect appropriate  Lungs: breathing is symmetric and unlabored bilaterally  Heart: regular rate and rhythm  Abdomen: no guarding  Head: normocephalic, atraumatic  Skin: No significant  abnormalities, good turgor  Sensation intact to light touch: L1-S1 dermatomes  Muscular exam: 5/5 strength in all major muscle groups unless noted in specialty exam.    Extremities:      Left upper extremity: Full active and passive range of motion without pain, deformity, no open wound, strength 5/5 in all major muscle groups.    Right upper extremity: Full active and passive range of motion without pain, deformity, no open wound, strength 5/5 in all major muscle groups.    Right lower extremity: Full active and passive range of motion without pain, deformity, no open wound, strength 5/5 in all major muscle groups.    Left lower extremity:  Valgus deformity is noted.  Range of motion of the knee is 0-110.   Ligamentous testing of the knee indicates stability of the the MCL, LCL, PCL, and ACL. Lachman's, anterior and posterior drawer tests are specifically negative. Medial joint line tenderness to palpation is noted.  Popliteal area is unremarkable.   1+  for effusion. No patellar crepitus.  Patella tracks centrally.  Pivot shift is negative.  Strength testing is indicative of 5/5 strength at hip flexion, extension, knee flexion and extension, tibialis anterior, EHL, and FHL.  Sensation is intact to light touch in the L1-S1 dermatomes.  Capillary refill is less than 2 seconds in the toes.    Diagnostics:    Pertinent Diagnostics:  Xrays are available of the left knee, they indicate severe bone on bone lateral compartment and patellofemoral compartment osteoarthritis of the knee joint, no significant other findings, no other osseus abnormalities, fractures, or dislocations.       Assessment: Osteoarthritis left knee    Plan:  This patient and I did discuss the many options in treating knee osteoarthritis.  We did discuss that we could continue to seek out nonoperative modalities, such as: NSAIDs, oral and topical analgesics, knee injections, knee braces, physical therapy, stretching, strengthening, and weight loss  strategies, activity modification, ambulatory assistive devices.  The patient stated their understanding with this, but and would like to proceed with nonsurgical management in the form of injections, close follow up.      Procedures: Date of Procedure: 11/18/2019  PROCEDURE NOTE: Left knee injection of Celestone    Consent was obtained from the patient. The correct site was identified after confirmation with the patient.  Following identification and confirmation of the correct site with the patient, the superolateral left knee was prepped in the normal sterile fashion.  A local anesthetic of  1% lidocaine without epinephrine was then administered to the local tissues.  Following, an injection of a mixture of  6 mg Celestone and 1% lidocaine without epinephrine was administered to the left knee.  The needle was then withdrawn and the injection site dressed with a sterile bandage at the conclusion.  The procedure was well tolerated by the patient.  No immediate adverse reactions were noted.  Post injection instructions were given.      Wendy Ali has a reminder for a "due or due soon" health maintenance. I have asked that she contact her primary care provider for follow-up on this health maintenance.

## 2019-11-18 NOTE — Progress Notes (Signed)
11/18/2019    Chief Complaint: Left knee pain    HPI: This is a 79 y.o. female who complains of left knee pain. Onset was gradual.  The patient has had activity dependent pain for years.    The patient has tried activity modification, physical therapy exercises, injections have helped in the past.  The pain is in the lateral knee, it is severe in intensity.  The patient feels unstable with the knee, fears falling, and has significant limitation with activities of daily living, recreation, and walks with no device.    Past Medical History:   Diagnosis Date   ??? Arthritis    ??? Diabetes ([place])    ??? Hypercholesterolemia    ??? Hypertension        Past Surgical History:   Procedure Laterality Date   ??? HX BREAST BIOPSY Left 1984    benign   ??? HX ORTHOPAEDIC      knee surgery 2002       Current Outpatient Medications on File Prior to Visit   Medication Sig Dispense Refill   ??? cholecalciferol (VITAMIN D3) (5000 Units /125 mcg) capsule Take 1 Cap by mouth daily. 90 Cap 2   ??? metFORMIN (GLUCOPHAGE) 1,000 mg tablet Take 1,000 mg by mouth two (2) times daily (with meals). Indications: type 2 diabetes mellitus     ??? simvastatin (ZOCOR) 20 mg tablet Take 20 mg by mouth nightly. Indications: high cholesterol     ??? valsartan-hydroCHLOROthiazide (DIOVAN-HCT) 80-12.5 mg per tablet Take 1 Tab by mouth daily. Indications: high blood pressure     ??? cyanocobalamin 1,000 mcg tablet Take 1,000 mcg by mouth daily.     ??? ascorbic acid, vitamin C, (Vitamin C) 500 mg tablet Take 500 mg by mouth daily.     ??? vitamin E (AQUA GEMS) 400 unit capsule Take 400 Units by mouth daily.     ??? aspirin 81 mg chewable tablet Take 81 mg by mouth daily.     ??? cpap machine kit by Does Not Apply route.     ??? diclofenac (VOLTAREN) 1 % gel Apply 2 g to affected area four (4) times daily. 100 g 2   ??? magnesium oxide (MAG-OX) 400 mg tablet Take 400 mg by mouth daily.       No current facility-administered medications on file prior to visit.        No Known Allergies     No family history on file.    Social History     Socioeconomic History   ??? Marital status: SINGLE     Spouse name: Not on file   ??? Number of children: Not on file   ??? Years of education: Not on file   ??? Highest education level: Not on file   Tobacco Use   ??? Smoking status: Never Smoker   ??? Smokeless tobacco: Never Used   Substance and Sexual Activity   ??? Alcohol use: Not Currently   ??? Drug use: Not Currently   ??? Sexual activity: Not Currently         Review of Systems:       General: Denies headache, lethargy, fever, weight loss  Ears/Nose/Throat: Denies ear discharge, drainage, nosebleeds, hoarse voice, dental problems  Cardiovascular: Denies chest pain, shortness of breath  Lungs: Denies chest pain, breathing problems, wheezing, pneumonia  Stomach: Denies stomach pain, heartburn, constipation, irritable bowel  Skin: Denies rash, sores, open wounds  Musculoskeletal: Admits to knee pain, no deformity.  Genitourinary: Denies dysuria,  hematuria, polyuria  Gastrointestinal: Denies constipation, obstipation, diarrhea  Neurological: Denies changes in sight, smell, hearing, taste, seizures. Denies loss of consciousness.  Psychiatric: Denies depression, sleep pattern changes, anxiety, change in personality  Endocrine: Denies mood swings, heat or cold intolerance  Hematologic/Lymphatic: Denies anemia, purpura, petechia  Allergic/Immunologic: Denies swelling of throat, pain or swelling at lymph nodes      Physical Examination:    Visit Vitals  BP 132/79 (BP 1 Location: Right arm, BP Patient Position: Sitting, BP Cuff Size: Large adult)   Pulse 94   Temp (!) 96.6 ??F (35.9 ??C) (Tympanic)   Ht 5' 4" (1.626 m)   Wt 188 lb (85.3 kg)   SpO2 98%   BMI 32.27 kg/m??        General: AOX3, no apparent distress  Psychiatric: mood and affect appropriate  Lungs: breathing is symmetric and unlabored bilaterally  Heart: regular rate and rhythm  Abdomen: no guarding  Head: normocephalic, atraumatic  Skin: No significant abnormalities, good  turgor  Sensation intact to light touch: L1-S1 dermatomes  Muscular exam: 5/5 strength in all major muscle groups unless noted in specialty exam.    Extremities:      Left upper extremity: Full active and passive range of motion without pain, deformity, no open wound, strength 5/5 in all major muscle groups.    Right upper extremity: Full active and passive range of motion without pain, deformity, no open wound, strength 5/5 in all major muscle groups.    Right lower extremity: Full active and passive range of motion without pain, deformity, no open wound, strength 5/5 in all major muscle groups.    Left lower extremity:  Valgus deformity is noted.  Range of motion of the knee is 0-110.   Ligamentous testing of the knee indicates stability of the the MCL, LCL, PCL, and ACL. Lachman's, anterior and posterior drawer tests are specifically negative. Medial joint line tenderness to palpation is noted.  Popliteal area is unremarkable.   1+  for effusion. No patellar crepitus.  Patella tracks centrally.  Pivot shift is negative.  Strength testing is indicative of 5/5 strength at hip flexion, extension, knee flexion and extension, tibialis anterior, EHL, and FHL.  Sensation is intact to light touch in the L1-S1 dermatomes.  Capillary refill is less than 2 seconds in the toes.    Diagnostics:    Pertinent Diagnostics:  Xrays are available of the left knee, they indicate severe bone on bone lateral compartment and patellofemoral compartment osteoarthritis of the knee joint, no significant other findings, no other osseus abnormalities, fractures, or dislocations.       Assessment: Osteoarthritis left knee    Plan:  This patient and I did discuss the many options in treating knee osteoarthritis.  We did discuss that we could continue to seek out nonoperative modalities, such as: NSAIDs, oral and topical analgesics, knee injections, knee braces, physical therapy, stretching, strengthening, and weight loss strategies, activity  modification, ambulatory assistive devices.  The patient stated their understanding with this, but and would like to proceed with nonsurgical management in the form of injections, close follow up.      Procedures: Date of Procedure: 11/18/2019  PROCEDURE NOTE: Left knee injection of Celestone    Consent was obtained from the patient. The correct site was identified after confirmation with the patient.  Following identification and confirmation of the correct site with the patient, the superolateral left knee was prepped in the normal sterile fashion.  A local anesthetic of  1% lidocaine without epinephrine was then administered to the local tissues.  Following, an injection of a mixture of  6 mg Celestone and 1% lidocaine without epinephrine was administered to the left knee.  The needle was then withdrawn and the injection site dressed with a sterile bandage at the conclusion.  The procedure was well tolerated by the patient.  No immediate adverse reactions were noted.  Post injection instructions were given.      Wendy Ali has a reminder for a "due or due soon" health maintenance. I have asked that she contact her primary care provider for follow-up on this health maintenance.

## 2019-11-18 NOTE — Progress Notes (Signed)
Identified pt with two pt identifiers (name and DOB). Reviewed chart in preparation for visit and have obtained necessary documentation.  Wendy Ali is a 79 y.o. female  Chief Complaint   Patient presents with   ??? Knee Pain     LT knee     Visit Vitals  BP 132/79 (BP 1 Location: Right arm, BP Patient Position: Sitting, BP Cuff Size: Large adult)   Pulse 94   Temp (!) 96.6 ??F (35.9 ??C) (Tympanic)   Ht 5\' 4"  (1.626 m)   Wt 188 lb (85.3 kg)   SpO2 98%   BMI 32.27 kg/m??     1. Have you been to the ER, urgent care clinic since your last visit?  Hospitalized since your last visit?No    2. Have you seen or consulted any other health care providers outside of the [facility] since your last visit?  Include any pap smears or colon screening. No

## 2019-11-26 ENCOUNTER — Telehealth: Attending: Adult Reconstructive Orthopaedic Surgery | Primary: Internal Medicine

## 2019-11-26 ENCOUNTER — Telehealth
Admit: 2019-11-26 | Discharge: 2019-11-26 | Payer: MEDICARE | Attending: Adult Reconstructive Orthopaedic Surgery | Primary: Internal Medicine

## 2019-11-26 DIAGNOSIS — M1712 Unilateral primary osteoarthritis, left knee: Secondary | ICD-10-CM

## 2019-11-26 NOTE — Progress Notes (Signed)
11/26/2019      CC: left knee pain    HPI:      This is a 79 y.o. year old female who presents for follow up of their left knee injection.   The patient states that they have had moderate to poor relief of their pain.   The time since injection has been over a week.      PMH:  Past Medical History:   Diagnosis Date   ??? Arthritis    ??? Diabetes ([place])    ??? Hypercholesterolemia    ??? Hypertension        PSxHx:  Past Surgical History:   Procedure Laterality Date   ??? HX BREAST BIOPSY Left 1984    benign   ??? HX ORTHOPAEDIC      knee surgery 2002       Meds:    Current Outpatient Medications:   ???  cholecalciferol (VITAMIN D3) (5000 Units /125 mcg) capsule, Take 1 Cap by mouth daily., Disp: 90 Cap, Rfl: 2  ???  metFORMIN (GLUCOPHAGE) 1,000 mg tablet, Take 1,000 mg by mouth two (2) times daily (with meals). Indications: type 2 diabetes mellitus, Disp: , Rfl:   ???  simvastatin (ZOCOR) 20 mg tablet, Take 20 mg by mouth nightly. Indications: high cholesterol, Disp: , Rfl:   ???  valsartan-hydroCHLOROthiazide (DIOVAN-HCT) 80-12.5 mg per tablet, Take 1 Tab by mouth daily. Indications: high blood pressure, Disp: , Rfl:   ???  cyanocobalamin 1,000 mcg tablet, Take 1,000 mcg by mouth daily., Disp: , Rfl:   ???  ascorbic acid, vitamin C, (Vitamin C) 500 mg tablet, Take 500 mg by mouth daily., Disp: , Rfl:   ???  vitamin E (AQUA GEMS) 400 unit capsule, Take 400 Units by mouth daily., Disp: , Rfl:   ???  magnesium oxide (MAG-OX) 400 mg tablet, Take 400 mg by mouth daily., Disp: , Rfl:   ???  aspirin 81 mg chewable tablet, Take 81 mg by mouth daily., Disp: , Rfl:   ???  cpap machine kit, by Does Not Apply route., Disp: , Rfl:   ???  diclofenac (VOLTAREN) 1 % gel, Apply 2 g to affected area four (4) times daily., Disp: 100 g, Rfl: 2    All:  No Known Allergies    Social Hx:  Social History     Socioeconomic History   ??? Marital status: SINGLE     Spouse name: Not on file   ??? Number of children: Not on file   ??? Years of education: Not on file   ??? Highest  education level: Not on file   Tobacco Use   ??? Smoking status: Never Smoker   ??? Smokeless tobacco: Never Used   Substance and Sexual Activity   ??? Alcohol use: Not Currently   ??? Drug use: Not Currently   ??? Sexual activity: Not Currently       Family Hx:  No family history on file.      Review of Systems:       General: Denies headache, lethargy, fever, weight loss  Ears/Nose/Throat: Denies ear discharge, drainage, nosebleeds, hoarse voice, dental problems  Cardiovascular: Denies chest pain, shortness of breath  Lungs: Denies chest pain, breathing problems, wheezing, pneumonia  Stomach: Denies stomach pain, heartburn, constipation, irritable bowel  Skin: Denies rash, sores, open wounds  Musculoskeletal: left knee pain  Genitourinary: Denies dysuria, hematuria, polyuria  Gastrointestinal: Denies constipation, obstipation, diarrhea  Neurological: Denies changes in sight, smell, hearing, taste, seizures. Denies loss  of consciousness.  Psychiatric: Denies depression, sleep pattern changes, anxiety, change in personality  Endocrine: Denies mood swings, heat or cold intolerance  Hematologic/Lymphatic: Denies anemia, purpura, petechia  Allergic/Immunologic: Denies swelling of throat, pain or swelling at lymph nodes      Physical Examination:    There were no vitals taken for this visit.     General: AOX3, no apparent distress  Psychiatric: mood and affect appropriate        Diagnostics:    Pertinent Diagnostics: none    Assessment: Status post left knee injection  Plan:    This patient and I discussed the normal course for injections, we discussed that pain relief will likely be temporary to some degree, but I cannot predict the longevity of relief.   We also discussed the limitations of injections, and that I cannot inject the same area any more often than every three months.   We will proceed with continued conservative observation, though she is aware that this is a limited treatment and as she has bone-on-bone arthritis.   Patient was in [place] at the time of encounter.    I was in the office while conducting this encounter.    Consent:  She and/or her healthcare decision maker is aware that this patient-initiated Telehealth encounter is a billable service, with coverage as determined by her insurance carrier. She is aware that she may receive a bill and has provided verbal consent to proceed: Yes    This virtual visit was conducted telephone encounter only. -  I affirm this is a Patient Initiated Episode with an Established Patient who has not had a related appointment within my department in the past 7 days or scheduled within the next 24 hours.  Note: this encounter is not billable if this call serves to triage the patient into an appointment for the relevant concern.      Total Time: minutes: 5-10 minutes.          Ms. Fedie has a reminder for a "due or due soon" health maintenance. I have asked that she contact her primary care provider for follow-up on this health maintenance.

## 2019-12-07 ENCOUNTER — Encounter

## 2020-01-04 ENCOUNTER — Inpatient Hospital Stay: Admit: 2020-01-04 | Payer: MEDICARE | Attending: Internal Medicine | Primary: Internal Medicine

## 2020-01-04 DIAGNOSIS — M810 Age-related osteoporosis without current pathological fracture: Secondary | ICD-10-CM

## 2020-01-04 DIAGNOSIS — Z1231 Encounter for screening mammogram for malignant neoplasm of breast: Secondary | ICD-10-CM

## 2020-02-16 ENCOUNTER — Ambulatory Visit: Attending: Adult Reconstructive Orthopaedic Surgery | Primary: Internal Medicine

## 2020-02-16 ENCOUNTER — Ambulatory Visit
Admit: 2020-02-16 | Discharge: 2020-02-16 | Payer: MEDICARE | Attending: Adult Reconstructive Orthopaedic Surgery | Primary: Internal Medicine

## 2020-02-16 DIAGNOSIS — M1712 Unilateral primary osteoarthritis, left knee: Secondary | ICD-10-CM

## 2020-02-16 MED ORDER — CELECOXIB 200 MG CAP
200 mg | ORAL_CAPSULE | Freq: Two times a day (BID) | ORAL | 0 refills | Status: DC
Start: 2020-02-16 — End: 2020-05-31

## 2020-02-16 NOTE — Progress Notes (Signed)
 Identified pt with two pt identifiers (name and DOB). Reviewed chart in preparation for visit and have obtained necessary documentation.  Wendy Ali is a 79 y.o. female  Chief Complaint   Patient presents with   . Follow-up     LT knee     Visit Vitals  BP 129/73 (BP 1 Location: Right arm, BP Patient Position: Sitting, BP Cuff Size: Large adult)   Pulse 88   Temp (!) 96.5 F (35.8 C) (Tympanic)   Ht 5' 4 (1.626 m)   Wt 187 lb (84.8 kg)   SpO2 99%   BMI 32.10 kg/m     1. Have you been to the ER, urgent care clinic since your last visit?  Hospitalized since your last visit?No    2. Have you seen or consulted any other health care providers outside of the [facility] since your last visit?  Include any pap smears or colon screening. No

## 2020-02-16 NOTE — Progress Notes (Signed)
02/16/2020      CC: Left knee pain    HPI:      This is a 79 y.o. year old female who presents for a follow up visit.  The patient was last seen and diagnosed with left knee osteoarthritis.   The patient's treatments since the most recent visit have comprised of injections, Tylenol.   The patient has had no relief of the chief complaint.         PMH:  Past Medical History:   Diagnosis Date   ??? Arthritis    ??? Diabetes ([place])    ??? Hypercholesterolemia    ??? Hypertension    ??? Menopause        PSxHx:  Past Surgical History:   Procedure Laterality Date   ??? HX BREAST BIOPSY Left 1984    benign   ??? HX ORTHOPAEDIC      knee surgery 2002       Meds:    Current Outpatient Medications:   ???  celecoxib (CELEBREX) 200 mg capsule, Take 1 Capsule by mouth two (2) times a day., Disp: 180 Capsule, Rfl: 0  ???  cholecalciferol (VITAMIN D3) (5000 Units /125 mcg) capsule, Take 1 Cap by mouth daily., Disp: 90 Cap, Rfl: 2  ???  metFORMIN (GLUCOPHAGE) 1,000 mg tablet, Take 1,000 mg by mouth two (2) times daily (with meals). Indications: type 2 diabetes mellitus, Disp: , Rfl:   ???  simvastatin (ZOCOR) 20 mg tablet, Take 20 mg by mouth nightly. Indications: high cholesterol, Disp: , Rfl:   ???  valsartan-hydroCHLOROthiazide (DIOVAN-HCT) 80-12.5 mg per tablet, Take 1 Tab by mouth daily. Indications: high blood pressure, Disp: , Rfl:   ???  cyanocobalamin 1,000 mcg tablet, Take 1,000 mcg by mouth daily., Disp: , Rfl:   ???  ascorbic acid, vitamin C, (Vitamin C) 500 mg tablet, Take 500 mg by mouth daily., Disp: , Rfl:   ???  vitamin E (AQUA GEMS) 400 unit capsule, Take 400 Units by mouth daily., Disp: , Rfl:   ???  magnesium oxide (MAG-OX) 400 mg tablet, Take 400 mg by mouth daily., Disp: , Rfl:   ???  aspirin 81 mg chewable tablet, Take 81 mg by mouth daily., Disp: , Rfl:   ???  cpap machine kit, by Does Not Apply route., Disp: , Rfl:   ???  diclofenac (VOLTAREN) 1 % gel, Apply 2 g to affected area four (4) times daily., Disp: 100 g, Rfl: 2    All:  No Known  Allergies    Social Hx:  Social History     Socioeconomic History   ??? Marital status: SINGLE     Spouse name: Not on file   ??? Number of children: Not on file   ??? Years of education: Not on file   ??? Highest education level: Not on file   Tobacco Use   ??? Smoking status: Never Smoker   ??? Smokeless tobacco: Never Used   Substance and Sexual Activity   ??? Alcohol use: Not Currently   ??? Drug use: Not Currently   ??? Sexual activity: Not Currently     Social Determinants of Health     Financial Resource Strain:    ??? Difficulty of Paying Living Expenses:    Food Insecurity:    ??? Worried About Charity fundraiser in the Last Year:    ??? Ran Out of Food in the Last Year:    Transportation Needs:    ??? Film/video editor (Medical):    ???  Lack of Transportation (Non-Medical):    Physical Activity:    ??? Days of Exercise per Week:    ??? Minutes of Exercise per Session:    Stress:    ??? Feeling of Stress :    Social Connections:    ??? Frequency of Communication with Friends and Family:    ??? Frequency of Social Gatherings with Friends and Family:    ??? Attends Religious Services:    ??? Marine scientist or Organizations:    ??? Attends Archivist Meetings:    ??? Marital Status:        Family Hx:  Family History   Problem Relation Age of Onset   ??? Ovarian Cancer Niece          Review of Systems:       General: Denies headache, lethargy, fever, weight loss  Ears/Nose/Throat: Denies ear discharge, drainage, nosebleeds, hoarse voice, dental problems  Cardiovascular: Denies chest pain, shortness of breath  Lungs: Denies chest pain, breathing problems, wheezing, pneumonia  Stomach: Denies stomach pain, heartburn, constipation, irritable bowel  Skin: Denies rash, sores, open wounds  Musculoskeletal: Left knee pain  Genitourinary: Denies dysuria, hematuria, polyuria  Gastrointestinal: Denies constipation, obstipation, diarrhea  Neurological: Denies changes in sight, smell, hearing, taste, seizures. Denies loss of  consciousness.  Psychiatric: Denies depression, sleep pattern changes, anxiety, change in personality  Endocrine: Denies mood swings, heat or cold intolerance  Hematologic/Lymphatic: Denies anemia, purpura, petechia  Allergic/Immunologic: Denies swelling of throat, pain or swelling at lymph nodes      Physical Examination:    Visit Vitals  BP 129/73 (BP 1 Location: Right arm, BP Patient Position: Sitting, BP Cuff Size: Large adult)   Pulse 88   Temp (!) 96.5 ??F (35.8 ??C) (Tympanic)   Ht '5\' 4"'$  (1.626 m)   Wt 187 lb (84.8 kg)   SpO2 99%   BMI 32.10 kg/m??        General: AOX3, no apparent distress  Psychiatric: mood and affect appropriate  Lungs: breathing is symmetric and unlabored bilaterally  Heart: regular rate and rhythm  Abdomen: no guarding  Head: normocephalic, atraumatic  Skin: No significant abnormalities, good turgor  Sensation intact to light touch: C5-T1 dermatomes  Muscular exam: 5/5 strength in all major muscle groups unless noted in specialty exam.    Extremities        Right lower extremity: Extremity is examined, no evidence of gross deformity, no gross motor or sensory deficit.  Full active and passive range of motion is noted.  Muscle tone and bulk is within normal expected limits.  Capillary refill is less than 2 seconds distally.  Left lower extremity: Valgus deformity.  Range of motion 0-95.  Capillary refill less than 2 seconds distally.  Valgus instability.  Capillary refill less than 2 seconds in the toes.      Diagnostics:    Pertinent Diagnostics: X-rays are available of the left knee, reviewed today, bone-on-bone lateral compartment arthrosis, no other abnormalities    Assessment: Left knee osteoarthritis  Plan:    This patient and I discussed her treatment options, she will continue to hope for nonoperative management, though she does have symptomatic bone-on-bone osteoarthritis.  She would like to proceed at least for now with Celebrex, I will prescribe this for her.  I offered her an off  loader brace, she has tried these in the past with little success.  She is aware that she will likely need total knee arthroplasty going forward.  She will let us know what she would like to do.      Ms. Levandoski has a reminder for a "due or due soon" health maintenance. I have asked that she contact her primary care provider for follow-up on this health maintenance.

## 2020-02-18 NOTE — Telephone Encounter (Signed)
-----   Message from Reena JONETTA Ned sent at 02/18/2020 10:43 AM EDT -----  Regarding: Dr. Imelda  Medication Refill    Caller (if not patient): n/a      Relationship of caller (if not patient):n/a      Best contact number(s): [phone]      Name of medication and dosage if known:  metformin  1000mg  simvastatin  20mg  zalsartan/hctz 80mg -12.5       Is patient out of this medication (yes/no): yes      Pharmacy name: [facility] mail order    Pharmacy listed in chart? (yes/no): yes  Pharmacy phone number:  [phone]      Details to clarify the request: n/a      Reena JONETTA Ned

## 2020-02-19 MED ORDER — SIMVASTATIN 20 MG TAB
20 mg | ORAL_TABLET | Freq: Every evening | ORAL | 0 refills | Status: DC
Start: 2020-02-19 — End: 2020-03-07

## 2020-02-19 MED ORDER — VALSARTAN-HYDROCHLOROTHIAZIDE 80 MG-12.5 MG TAB
ORAL_TABLET | Freq: Every day | ORAL | 0 refills | Status: DC
Start: 2020-02-19 — End: 2020-03-07

## 2020-02-19 MED ORDER — METFORMIN 1,000 MG TAB
1000 mg | ORAL_TABLET | Freq: Two times a day (BID) | ORAL | 0 refills | Status: DC
Start: 2020-02-19 — End: 2020-02-22

## 2020-02-22 MED ORDER — METFORMIN 1,000 MG TAB
1000 mg | ORAL_TABLET | ORAL | 2 refills | Status: DC
Start: 2020-02-22 — End: 2020-03-07

## 2020-03-03 ENCOUNTER — Ambulatory Visit: Attending: Internal Medicine | Primary: Internal Medicine

## 2020-03-03 ENCOUNTER — Ambulatory Visit: Admit: 2020-03-03 | Discharge: 2020-03-03 | Payer: MEDICARE | Attending: Internal Medicine | Primary: Internal Medicine

## 2020-03-03 NOTE — Progress Notes (Signed)
Spoke with pt regarding her labs. Since her Vitamin D has improved, her PTH has come down to 61. In the face of a Ca of 10.5 this would still be considered "inappropriately normal". Her Ca is under 11.2 and she is not having any symptoms or complications from the high calcium.  For now we agreed to monitor.

## 2020-03-03 NOTE — Progress Notes (Signed)
Chief Complaint   Patient presents with   ??? Elevated Blood Calcium     pcp and pharmacy verified     History of Present Illness: Wendy Ali is a 79 y.o. female who I was asked to see in consult by Dr. Felicie Morn for evaluation of hypercalcemia and possible hyperparathyroidism.    Pt presented to Dr. Leonor Liv in December 2020 for a new patient visit. At the time her Ca was 10.6 and her Vitamin D was 10.8 and her TSH was 1.29. Her renal function was good with Cr of 0.91.Dr. Leonor Liv repeated her Ca in February 2021 and at that time her Ca had declined to 10.3, but her PTH was 99.6.   She had a DXA scan and it did not show any evidence of osteoporosis or osteopenia.      Pt notes that she has never been told she had high calcium in the past.  She denies hx of renal stones, hematuria or dysuria.  She denies any new or worsening joint pains.  No hx of bone fractures or osteoporosis.  She denies issues of AMS, confusion or mood swings.  Pt has no personal hx of cancer.  Pt notes she has hx of GERD "but id very seldom that I have any problems now". She is not on PPI, she notes she will take TUMS "about once per month." She has not taken any antacids recently.    She is not does not take a Ca or MVI supplement, but she was started on Vitamin D daily, which she was started on by Dr. Leonor Liv. She notes she has been on the Vitamin D for 6 months.    Pt has hx of osteoarthritis and has been seen multiple times in 2021 for knee pains. She has had steroid injections in her knees with minimal improvement.  Pt notes she has been dealing with "bad knees, for some time", she had R-TKR in 2002.    Pt reports that in 2017 she was told she had a thyroid nodule and had a biopsy that was read as benign when she was in [place]. Will request the records from her prior PCP.    No known family hx of "calcuim issues", renal stones, bone fractures or osteoporosis.  Pt notes she has two nieces who died from cancer (stomac and tumors on the  brain).      Past Medical History:   Diagnosis Date   ??? Arthritis    ??? Diabetes ([place])    ??? Hypercholesterolemia    ??? Hypertension    ??? Menopause      Past Surgical History:   Procedure Laterality Date   ??? HX BREAST BIOPSY Left 1984    benign   ??? HX ORTHOPAEDIC      knee surgery 2002     Current Outpatient Medications   Medication Sig   ??? metFORMIN (GLUCOPHAGE) 1,000 mg tablet TAKE 1 TABLET BY MOUTH  TWICE DAILY WITH MEALS FOR  TYPE 2 DIABETES MELLITUS   ??? valsartan-hydroCHLOROthiazide (DIOVAN-HCT) 80-12.5 mg per tablet Take 1 Tablet by mouth daily. Indications: high blood pressure   ??? simvastatin (ZOCOR) 20 mg tablet Take 1 Tablet by mouth nightly. Indications: high cholesterol   ??? celecoxib (CELEBREX) 200 mg capsule Take 1 Capsule by mouth two (2) times a day.   ??? cholecalciferol (VITAMIN D3) (5000 Units /125 mcg) capsule Take 1 Cap by mouth daily.   ??? cyanocobalamin 1,000 mcg tablet Take 1,000 mcg by mouth  daily.   ??? ascorbic acid, vitamin C, (Vitamin C) 500 mg tablet Take 500 mg by mouth daily.   ??? vitamin E (AQUA GEMS) 400 unit capsule Take 400 Units by mouth daily.   ??? magnesium oxide (MAG-OX) 400 mg tablet Take 400 mg by mouth daily.   ??? aspirin 81 mg chewable tablet Take 81 mg by mouth daily.   ??? cpap machine kit by Does Not Apply route.     No current facility-administered medications for this visit.     No Known Allergies  Family History   Problem Relation Age of Onset   ??? No Known Problems Mother         murdered   ??? Stroke Father    ??? Hypertension Father    ??? Diabetes Sister    ??? Hypertension Sister    ??? Hypertension Brother    ??? Heart defect Brother         defibrilattor   ??? Diabetes Brother    ??? Heart Disease Brother         has pacemaker   ??? Ovarian Cancer Niece    ??? Cancer Niece         x2   ??? No Known Problems Maternal Grandmother    ??? No Known Problems Maternal Grandfather    ??? No Known Problems Paternal Grandmother    ??? No Known Problems Paternal Grandfather      Social History     Socioeconomic  History   ??? Marital status: SINGLE     Spouse name: Not on file   ??? Number of children: Not on file   ??? Years of education: Not on file   ??? Highest education level: Not on file   Occupational History   ??? Not on file   Tobacco Use   ??? Smoking status: Never Smoker   ??? Smokeless tobacco: Never Used   Substance and Sexual Activity   ??? Alcohol use: Yes     Comment: occ wine   ??? Drug use: Never   ??? Sexual activity: Not Currently   Other Topics Concern   ??? Not on file   Social History Narrative   ??? Not on file     Social Determinants of Health     Financial Resource Strain:    ??? Difficulty of Paying Living Expenses:    Food Insecurity:    ??? Worried About Charity fundraiser in the Last Year:    ??? Arboriculturist in the Last Year:    Transportation Needs:    ??? Film/video editor (Medical):    ??? Lack of Transportation (Non-Medical):    Physical Activity:    ??? Days of Exercise per Week:    ??? Minutes of Exercise per Session:    Stress:    ??? Feeling of Stress :    Social Connections:    ??? Frequency of Communication with Friends and Family:    ??? Frequency of Social Gatherings with Friends and Family:    ??? Attends Religious Services:    ??? Marine scientist or Organizations:    ??? Attends Music therapist:    ??? Marital Status:    Intimate Production manager Violence:    ??? Fear of Current or Ex-Partner:    ??? Emotionally Abused:    ??? Physically Abused:    ??? Sexually Abused:      Review of Systems:  - Constitutional Symptoms: no fevers, chills, weight  loss  - Eyes: no blurry vision or double vision  - Cardiovascular: no chest pain or palpitations  - Respiratory: no cough or shortness of breath  - Gastrointestinal: no dysphagia or abdominal pain  - Musculoskeletal: + chronic knee pain  - Integumentary: no rashes  - Neurological: no numbness, tingling, or headaches  - Psychiatric: no depression or anxiety  - Endocrine: no heat or cold intolerance, no polyuria or polydipsia    Physical Examination:  Blood pressure 139/82,  pulse 80, height 5' 4" (1.626 m), weight 185 lb 11.2 oz (84.2 kg).  - General: pleasant, no distress, good eye contact  - HEENT: no exopthalmos, no periorbital edema, no scleral/conjunctival injection, EOMI, no lid lag or stare  - Neck: supple, no thyromegaly, masses, lymph nodes, or carotid bruits, no supraclavicular or dorsocervical fat pads  - Cardiovascular: regular, normal rate, normal S1 and S2, no murmurs/rubs/gallops, 2+ dorsalis pedis pulses bilaterally  - Respiratory: clear to auscultation bilaterally  - Gastrointestinal: soft, nontender, nondistended, no masses, no hepatosplenomegaly  - Musculoskeletal: no proximal muscle weakness in upper or lower extremities  - Integumentary: no acanthosis nigricans, no rashes, no edema,   - Neurological: reflexes 2+ at biceps, no tremor  - Psychiatric: normal mood and affect    Data Reviewed:   Component      Latest Ref Rng & Units 09/07/2019 07/15/2019 07/15/2019 07/15/2019           1:27 PM 11:30 AM 11:30 AM 11:30 AM   Calcium      8.5 - 10.1 MG/DL 10.3 (H)      WBC      3.4 - 10.8 x10E3/uL    5.2   RBC      3.77 - 5.28 x10E6/uL    4.47   HGB      11.1 - 15.9 g/dL    12.2   HCT      34.0 - 46.6 %    36.4   MCV      79 - 97 fL    81   MCH      26.6 - 33.0 pg    27.3   MCHC      31.5 - 35.7 g/dL    33.5   RDW      11.7 - 15.4 %    15.2   PLATELET      150 - 450 x10E3/uL    430   PTH, Intact      18.4 - 88.0 pg/mL 99.6 (H)      TSH      0.450 - 4.500 uIU/mL   1.290    VITAMIN D, 25-HYDROXY      30.0 - 100.0 ng/mL  10.8 (L)       Component      Latest Ref Rng & Units 07/15/2019          11:30 AM   Glucose      65 - 99 mg/dL 107 (H)   BUN      8 - 27 mg/dL 18   Creatinine      0.57 - 1.00 mg/dL 0.91   GFR est non-AA      >59 mL/min/1.73 60   GFR est AA      >59 mL/min/1.73 69   BUN/Creatinine ratio      12 - 28 20   Sodium      134 - 144 mmol/L 143   Potassium      3.5 - 5.2 mmol/L  4.3   Chloride      96 - 106 mmol/L 104   CO2      20 - 29 mmol/L 24   Calcium      8.5 -  10.1 MG/DL 10.6 (H)   Protein, total      6.0 - 8.5 g/dL 7.8   Albumin      3.7 - 4.7 g/dL 4.7   GLOBULIN, TOTAL      1.5 - 4.5 g/dL 3.1   A-G Ratio      1.2 - 2.2 1.5   Bilirubin, total      0.0 - 1.2 mg/dL 0.4   Alk. phosphatase      39 - 117 IU/L 77   AST      0 - 40 IU/L 24   ALT      0 - 32 IU/L 10     Narrative & Impression   Bone Mineral Density     Indication:  Screening for osteoporosis  Age: 81  Sex: Female.  ??  Menopause status: postmenopausal.  Hormone replacement therapy: No  ??  Number of falls in the past year:   1  Risk factors for osteoporosis:  None.  ??  Current medication for osteoporosis: Vitamin D.     Comparison: None.     Technique: Imaging was performed on the [employer]  meta-analysis fracture risk calculator (FRAX) analysis was performed for 10 year  fracture risk probability assessment     Excluded sites: 0      Findings:        Femoral Neck Left:  Bone mineral density (gm/cm2):  1.103 g/cm?  % of peak bone mass:  106%  % for age matched controls:  120%  T-score:  0.5   Z-score:  1.3   ??  Femoral Neck Right:  Bone mineral density (gm/cm2):  1.292 g/cm?  % of peak bone mass:  124%  % for age matched controls:  140%  T-score:  1.8   Z-score:  2.7      Total Hip Left:  Bone mineral density (gm/cm2):  1.112 g/cm?  % of peak bone mass:  110%  % for age matched controls:  119%  T-score:  0.8   Z-score:  1.4   ??  Total Hip Right:  Bone mineral density (gm/cm2):  1.219 g/cm?  % of peak bone mass:  121%  % for age matched controls:  131%  T-score:  1.7   Z-score:  2.3   ??  Lumbar Spine:  L1-4 or specify  Bone mineral density (gm/cm2):  1.509 g/cm?  % of peak bone mass:  128%  % for age matched controls:  135%  T-score:  2.7   Z-score:  3.2   ??  33% Radius Left:  Bone mineral density (gm/cm2):  0.883 g/cm?  % of peak bone mass:  101%  % for age matched controls:  101%  T-score:  0.1   Z-score:  2.2   ??       Assessment/Plan:   1. Hypercalcemia    2. Hyperparathyroidism  ([place])    3. Hypovitaminosis D    4. Thyroid nodule    We discussed at length the PTH-Ca axis and how PTH controls calcium in the body. We also discussed the pathology of hyperparathyroidism and the potential sequela from high calcium and hyperparathyroidism.  Her Ca was 10.8 in December 2020, which did decline to 10.3. Her PTH was 99,  but her Vitamin D was 10, which can cause a secondary hyperparathyroidism.  She has been taking a daily Vitamin D supplement for the last 6 months so we will start with repeating the Vitamin D, PTH, a Renal panel, 1,25-OH vitamin D and phos level.  Once these results are back we discussed getting a parathyroid [place] and sestimibi scan.  We discussed treatment options and indications for treatment. She does not have osteoporosis, renal insufficiency, bone fractures and he Ca was under 11.3 so she may not need any surgical or medical intervention.  She is on a thiazide diuretic which can increase the Calcium levels. I would recommend adjusting her HTN regimen to get rid of the HCTZ.  With her hx of a thyroid nodule I will request the [place] and FNA from her prior PCP and will order a thyroid US to look for changes.    Pt voices understanding and agreement with the plan.  Pain noted and pt was recommended to call her PCP for further evaluation and treatment, as needed    RTC 4 months.    I spent 60 minutes on her case and > 50% of the time was spent in counseling on the above issues.     Follow-up and Dispositions    ?? Return in about 4 months (around 07/03/2020).       Copy sent to:  Dr Felicie Morn

## 2020-03-04 LAB — VITAMIN D, 25 HYDROXY: VITAMIN D, 25-HYDROXY: 36.3 ng/mL (ref 30.0–100.0)

## 2020-03-04 LAB — RENAL FUNCTION PANEL
Albumin: 4.7 g/dL — ABNORMAL HIGH (ref 3.6–4.6)
Albumin: 4.7 g/dL — ABNORMAL HIGH (ref 3.6–4.6)
BUN/Creatinine ratio: 25 (ref 12–28)
BUN: 23 mg/dL (ref 8–27)
BUN: 23 mg/dL (ref 8–27)
Bun/Cre Ratio: 25 NA (ref 12–28)
CO2: 24 mmol/L (ref 20–29)
CO2: 24 mmol/L (ref 20–29)
Calcium: 10.5 mg/dL — ABNORMAL HIGH (ref 8.7–10.3)
Calcium: 10.5 mg/dL — ABNORMAL HIGH (ref 8.7–10.3)
Chloride: 104 mmol/L (ref 96–106)
Chloride: 104 mmol/L (ref 96–106)
Creatinine: 0.92 mg/dL (ref 0.57–1.00)
Creatinine: 0.92 mg/dL (ref 0.57–1.00)
EGFR IF NonAfrican American: 59 mL/min/{1.73_m2} — ABNORMAL LOW (ref >59)
GFR African American: 68 mL/min/{1.73_m2} (ref >59)
GFR est AA: 68 mL/min/{1.73_m2} (ref >59)
GFR est non-AA: 59 mL/min/{1.73_m2} — ABNORMAL LOW (ref >59)
Glucose: 105 mg/dL — ABNORMAL HIGH (ref 65–99)
Glucose: 105 mg/dL — ABNORMAL HIGH (ref 65–99)
Phosphorus: 3.7 mg/dL (ref 3.0–4.3)
Phosphorus: 3.7 mg/dL (ref 3.0–4.3)
Potassium: 4.5 mmol/L (ref 3.5–5.2)
Potassium: 4.5 mmol/L (ref 3.5–5.2)
Sodium: 143 mmol/L (ref 134–144)
Sodium: 143 mmol/L (ref 134–144)

## 2020-03-04 LAB — CKD REPORT

## 2020-03-04 LAB — PTH INTACT: PTH, Intact: 61 pg/mL (ref 15–65)

## 2020-03-04 LAB — VITAMIN D, 1, 25 DIHYDROXY: Calcitriol (Vit D 1, 25 di-OH): 31.1 pg/mL (ref 19.9–79.3)

## 2020-03-04 LAB — PTH, INTACT: PTH: 61 pg/mL (ref 15–65)

## 2020-03-04 LAB — VITAMIN D 25 HYDROXY: Vit D, 25-Hydroxy: 36.3 ng/mL (ref 30.0–100.0)

## 2020-03-04 LAB — VITAMIN D 1,25 DIHYDROXY: Calcitriol: 31.1 pg/mL (ref 19.9–79.3)

## 2020-03-07 ENCOUNTER — Ambulatory Visit: Attending: Internal Medicine | Primary: Internal Medicine

## 2020-03-07 ENCOUNTER — Encounter

## 2020-03-07 ENCOUNTER — Ambulatory Visit: Admit: 2020-03-07 | Discharge: 2020-03-07 | Payer: MEDICARE | Attending: Internal Medicine | Primary: Internal Medicine

## 2020-03-07 DIAGNOSIS — I10 Essential (primary) hypertension: Secondary | ICD-10-CM

## 2020-03-07 MED ORDER — METFORMIN 1,000 MG TAB
1000 mg | ORAL_TABLET | ORAL | 2 refills | Status: DC
Start: 2020-03-07 — End: 2020-03-17

## 2020-03-07 MED ORDER — VALSARTAN-HYDROCHLOROTHIAZIDE 80 MG-12.5 MG TAB
ORAL_TABLET | Freq: Every day | ORAL | 0 refills | Status: DC
Start: 2020-03-07 — End: 2020-04-20

## 2020-03-07 MED ORDER — SIMVASTATIN 20 MG TAB
20 mg | ORAL_TABLET | Freq: Every evening | ORAL | 0 refills | Status: DC
Start: 2020-03-07 — End: 2020-04-20

## 2020-03-07 NOTE — ACP (Advance Care Planning) (Signed)
Advance Care Planning     Advance Care Planning (ACP) Physician/NP/PA Conversation      Date of Conversation: 03/07/2020  Conducted with: Patient with Decision Making Capacity    Healthcare Decision Maker:   No healthcare decision makers have been documented.   Click here to complete Haematologist of the Research scientist (physical sciences) Relationship (ie "Primary")    Today we documented Decision Maker(s) consistent with ACP documents on file.    Care Preferences:    Hospitalization: "If your health worsens and it becomes clear that your chance of recovery is unlikely, what would be your preference regarding hospitalization?"  The patient would prefer comfort-focused treatment without hospitalization.    Ventilation: "If you were unable to breathe on your own and your chance of recovery was unlikely, what would be your preference about the use of a ventilator (breathing machine) if it was available to you?"   The patient would desire the use of a ventilator.    Resuscitation: "In the event your heart stopped as a result of an underlying serious health condition, would you want attempts to be made to restart your heart, or would you prefer a natural death?"   Yes, attempt to resuscitate.    Additional topics discussed: treatment goals, benefit/burden of treatment options, artificial nutrition, ventilation preferences, hospitalization preferences and resuscitation preferences    Conversation Outcomes / Follow-Up Plan:   ACP complete - no further action today  Reviewed DNR/DNI and patient elects Full Code (Attempt Resuscitation)     Length of Voluntary ACP Conversation in minutes:  30 minutes    [name], MD

## 2020-03-07 NOTE — Progress Notes (Signed)
Urine is cloudy with moderate leuk. Patient is treated for  uti  Rest of results are normal

## 2020-03-07 NOTE — Progress Notes (Signed)
Progress  Notes by [name], MD at 03/07/20 1030                Author: [name], MD  Service: --  Author Type: Physician       Filed: 03/10/20 1833  Encounter Date: 03/07/2020  Status: Signed          Editor: [name], MD (Physician)                 Chief Complaint       Patient presents with        ?  Follow-up             6 month         HPI:   Wendy Ali is a 79 y.o. AA female  with h/o hypertension, hypercholesterolemia, diabetes type 2 presents for 6 month follow up. .   Patient has been doing well. Blood pressure is at goal. She has no complaints.   Advance care has been discussed and documented in the system today.       Review of Systems   As per hpi        Past Medical History:        Diagnosis  Date         ?  Arthritis       ?  Diabetes ([place])       ?  Hypercholesterolemia       ?  Hypertension           ?  Menopause            Past Surgical History:         Procedure  Laterality  Date          ?  HX BREAST BIOPSY  Left  1984          benign          ?  HX ORTHOPAEDIC              knee surgery 2002          Social History          Socioeconomic History         ?  Marital status:  SINGLE              Spouse name:  Not on file         ?  Number of children:  Not on file     ?  Years of education:  Not on file     ?  Highest education level:  Not on file       Tobacco Use         ?  Smoking status:  Never Smoker     ?  Smokeless tobacco:  Never Used       Substance and Sexual Activity         ?  Alcohol use:  Yes             Comment: occ wine         ?  Drug use:  Never         ?  Sexual activity:  Not Currently          Social Determinants of Health          Financial Resource Strain:         ?  Difficulty of Paying Living Expenses:  Food Insecurity:         ?  Worried About Charity fundraiser in the Last Year:      ?  Arboriculturist in the Last Year:        Transportation Needs:         ?  Film/video editor (Medical):      ?  Lack of Transportation (Non-Medical):         Physical Activity:         ?  Days of Exercise per Week:      ?  Minutes of Exercise per Session:        Stress:         ?  Feeling of Stress :        Social Connections:         ?  Frequency of Communication with Friends and Family:      ?  Frequency of Social Gatherings with Friends and Family:      ?  Attends Religious Services:      ?  Active Member of Clubs or Organizations:      ?  Attends Archivist Meetings:         ?  Marital Status:           Family History         Problem  Relation  Age of Onset          ?  No Known Problems  Mother                murdered          ?  Stroke  Father       ?  Hypertension  Father       ?  Diabetes  Sister       ?  Hypertension  Sister       ?  Hypertension  Brother       ?  Heart defect  Brother                defibrilattor          ?  Diabetes  Brother       ?  Heart Disease  Brother                has pacemaker          ?  Ovarian Cancer  Niece       ?  Cancer  Niece                x2          ?  No Known Problems  Maternal Grandmother       ?  No Known Problems  Maternal Grandfather       ?  No Known Problems  Paternal Grandmother            ?  No Known Problems  Paternal Grandfather            Current Outpatient Medications          Medication  Sig  Dispense  Refill           ?  metFORMIN (GLUCOPHAGE) 1,000 mg tablet  TAKE 1 TABLET BY MOUTH  TWICE DAILY WITH MEALS FOR  TYPE 2 DIABETES MELLITUS  30 Tablet  2     ?  valsartan-hydroCHLOROthiazide (DIOVAN-HCT) 80-12.5 mg per tablet  Take 1 Tablet by mouth daily.  Indications: high blood pressure  30 Tablet  0     ?  simvastatin (ZOCOR) 20 mg tablet  Take 1 Tablet by mouth nightly. Indications: high cholesterol  30 Tablet  0     ?  celecoxib (CELEBREX) 200 mg capsule  Take 1 Capsule by mouth two (2) times a day.  180 Capsule  0     ?  cholecalciferol (VITAMIN D3) (5000 Units /125 mcg) capsule  Take 1 Cap by mouth daily.  90 Cap  2     ?  cyanocobalamin 1,000 mcg tablet  Take 1,000 mcg by mouth daily.         ?   ascorbic acid, vitamin C, (Vitamin C) 500 mg tablet  Take 500 mg by mouth daily.         ?  vitamin E (AQUA GEMS) 400 unit capsule  Take 400 Units by mouth daily.         ?  aspirin 81 mg chewable tablet  Take 81 mg by mouth daily.               ?  cpap machine kit  by Does Not Apply route.            No Known Allergies      Objective:   Visit Vitals      BP  130/64     Pulse  87     Temp  97.4 ??F (36.3 ??C) (Oral)     Resp  20     Ht  5' 4"  (1.626 m)     Wt  186 lb (84.4 kg)     SpO2  98%        BMI  31.93 kg/m??        Physical Exam:    General appearance - alert, well appearing in no distress   Mental status - alert, oriented to person, place, and time   Neck - supple, no significant adenopathy    Chest - clear to auscultation, no wheezes, rales or rhonchi   Heart - normal rate, regular rhythm, no murmurs   Abdomen - soft, nontender, nondistended, no organomegaly   Ext-peripheral pulses normal, no pedal edema   Neuro -no focal findings        Results for orders placed or performed in visit on 03/03/20     PTH INTACT         Result  Value  Ref Range            PTH, Intact  61  15 - 65 pg/mL       RENAL FUNCTION PANEL         Result  Value  Ref Range            Glucose  105 (H)  65 - 99 mg/dL       BUN  23  8 - 27 mg/dL       Creatinine  0.92  0.57 - 1.00 mg/dL       GFR est non-AA  59 (L)  >59 mL/min/1.73       GFR est AA  68  >59 mL/min/1.73       BUN/Creatinine ratio  25  12 - 28       Sodium  143  134 - 144 mmol/L       Potassium  4.5  3.5 - 5.2 mmol/L       Chloride  104  96 - 106 mmol/L  CO2  24  20 - 29 mmol/L            Calcium  10.5 (H)  8.7 - 10.3 mg/dL       Phosphorus  3.7  3.0 - 4.3 mg/dL       Albumin  4.7 (H)  3.6 - 4.6 g/dL       VITAMIN D, 25 HYDROXY         Result  Value  Ref Range            VITAMIN D, 25-HYDROXY  36.3  30.0 - 100.0 ng/mL       VITAMIN D, 1, 25 DIHYDROXY         Result  Value  Ref Range            Calcitriol (Vit D 1, 25 di-OH)  31.1  19.9 - 79.3 pg/mL       CKD REPORT          Result  Value  Ref Range            Interpretation  Note          Assessment/Plan:   Diagnoses and all orders for this visit:      Hypertension, well controlled   -     valsartan-hydroCHLOROthiazide (DIOVAN-HCT) 80-12.5 mg per tablet; Take 1 Tablet by mouth daily. Indications: high blood pressure, Normal, Disp-30 Tablet, R-0      Controlled type 2 diabetes mellitus with diabetic nephropathy, without long-term current use of insulin (HCC)   -     metFORMIN (GLUCOPHAGE) 1,000 mg tablet; TAKE 1 TABLET BY MOUTH  TWICE DAILY WITH MEALS FOR  TYPE 2 DIABETES MELLITUS, Normal, Disp-30 Tablet, R-2Requesting 1 year supply   -     HEMOGLOBIN A1C WITH EAG; Future      Encounter for diabetic foot exam ([place])   -     HM DIABETES FOOT EXAM      Acute cystitis without hematuria   -     URINALYSIS W/ RFLX MICROSCOPIC; Future      Hypercholesterolemia   -     simvastatin (ZOCOR) 20 mg tablet; Take 1 Tablet by mouth nightly. Indications: high cholesterol, Normal, Disp-30 Tablet, R-0   -     LIPID PANEL; Future      Advanced care planning/counseling discussion   -     PR ADVANCE CARE PLANNING DISCUSS DOCUMENTED IN MEDICAL RECORD           Patient Instructions           Advance Care Planning: Care Instructions   Your Care Instructions         It can be hard to live with an illness that cannot be cured. But if your health is getting worse, you may want to make decisions about end-of-life care. Planning for the end of your life does not mean that you are giving up. It is a way to make sure that  your wishes are met. Clearly stating your wishes can make it easier for your loved ones. Making plans while you are still able may also ease your mind and make your final days less stressful and more meaningful.   Follow-up care is a key part of your treatment and safety. Be sure to make and go to all appointments, and call your doctor if you are having problems. It's also a good idea to  know your test results and keep a list of the  medicines you  take.   What can you do to plan for the end of life?   ??  You can bring these issues up with your doctor. You do not need to wait until your doctor starts the conversation. You might start with, "What makes life worth living for  me is..." And then follow it with, "I would not be willing to live with ...." When you complete this sentence it helps your doctor understand your wishes.   ??  Talk openly and honestly with your doctor. This is the best way to understand the decisions you will need to make as your health changes. Know that you can always change your mind.   ??  Ask your doctor about commonly used life-support measures. These include tube feedings, breathing machines, and fluids given through a vein (IV). Understanding these treatments will help you decide whether you want them.   ??  You may choose to have these life-supporting treatments for a limited time. This allows a trial period to see whether they will help you. You may also decide that you want your doctor to take only certain measures to keep you alive. It may help to think  about the big picture, like what makes life worth living for you or what your values and goals are.   ??  Talk to your doctor about how long you are likely to live. Your doctor may be able to give you an idea of what usually happens with your specific illness.   ??  Think about preparing papers that state your wishes. These papers are called advance directives. If you do this early and review them often, there will not be any confusion about what you want. You can change your instructions at any time.   Which papers should you prepare?   Advance directives are legal papers that tell doctors how you want to be cared for at the end of your life. You do not need a lawyer to write these papers. Ask your doctor or your state health department for information  on how to write your advance directives. They may have the forms for each of these types of papers. Make sure your  doctor has a copy of these on file, and give a copy to a family member or close friend.   ??  Consider a do-not-resuscitate order (DNR). This order asks that no extra treatments be done if your heart stops or you stop breathing. Extra treatments may include cardiopulmonary  resuscitation (CPR), electrical shock to restart your heart, or a machine to breathe for you. If you decide to have a DNR order, ask your doctor to explain and write it. Place the order in your home where everyone can easily see it.   ??  Consider a living will. A living will explains your wishes about life support and other treatments at the end of your life if you become unable to speak for yourself. Living wills tell doctors to use or not use treatments that would keep you alive. You  must have one or two witnesses or a notary present when you sign this form. A living will may be called something else in your state.   ??  Consider a medical power of attorney. This form allows you to name a person to make decisions about your care if you are not able to. Most people ask a close friend or family member. Talk to this person about the kinds of treatments you want and those  that  you do not want. Make sure this person understands your wishes. A medical power of attorney may be called something else in your state.   These legal papers are simple to change. Tell your doctor what you want to change, and ask him or her to make a note in your medical file. Give your family updated copies of the papers.   Where can you learn more?   Go to [email or web address]-rivera.info/   Enter P184 in the search box to learn more about "Advance Care Planning: Care Instructions."   Current as of: January 30, 2019??????????????????????????????Content Version: 12.8   ?? 2006-2021 Health[employer].    Care instructions adapted under license by [employer] (which disclaims liability or warranty for this information). If you have questions about a medical condition  or this instruction, always ask your  healthcare professional. [place] any warranty or liability for your use of this information.              Follow-up and Dispositions      ??  Return in about 6 months (around 09/07/2020), or if symptoms worsen or fail to improve, for routine follow up.

## 2020-03-08 LAB — LIPID PANEL
CHOL/HDL Ratio: 1.9 (ref 0.0–5.0)
Chol/HDL Ratio: 1.9 (ref 0.0–5.0)
Cholesterol, Total: 146 MG/DL (ref <200)
Cholesterol, total: 146 MG/DL (ref <200)
HDL Cholesterol: 76 MG/DL
HDL: 76 MG/DL
LDL Calculated: 52 MG/DL (ref 0–100)
LDL, calculated: 52 MG/DL (ref 0–100)
Triglyceride: 90 MG/DL (ref <150)
Triglycerides: 90 MG/DL (ref <150)
VLDL Cholesterol Calculated: 18 MG/DL
VLDL, calculated: 18 MG/DL

## 2020-03-08 LAB — URINALYSIS W/ RFLX MICROSCOPIC
Bilirubin, Urine: NEGATIVE
Bilirubin: NEGATIVE
Blood, Urine: NEGATIVE
Blood: NEGATIVE
Glucose, Ur: NEGATIVE mg/dL
Glucose: NEGATIVE mg/dL
Ketone: NEGATIVE mg/dL
Ketones, Urine: NEGATIVE mg/dL
Nitrite, Urine: NEGATIVE
Nitrites: NEGATIVE
Protein, UA: NEGATIVE mg/dL
Protein: NEGATIVE mg/dL
Specific Gravity, UA: 1.022 (ref 1.003–1.030)
Specific gravity: 1.022 (ref 1.003–1.030)
Urobilinogen, UA, POCT: 1 EU/dL (ref 0.2–1.0)
Urobilinogen: 1 EU/dL (ref 0.2–1.0)
pH (UA): 5 (ref 5.0–8.0)
pH, UA: 5 (ref 5.0–8.0)

## 2020-03-08 LAB — HEMOGLOBIN A1C WITH EAG
Est. average glucose: 97 mg/dL
Hemoglobin A1c: 5 % (ref 4.0–5.6)

## 2020-03-08 LAB — HEMOGLOBIN A1C W/EAG
Hemoglobin A1C: 5 % (ref 4.0–5.6)
eAG: 97 mg/dL

## 2020-03-08 NOTE — Telephone Encounter (Signed)
Faxed the request, but received a message that patient needs to sign a release form. Mailed a letter to patient, which included the form that patient needs to sign. Requested that she fax the release to Dr.Vaught's office. Their phone number is [phone]. Fax is [phone].

## 2020-03-08 NOTE — Telephone Encounter (Signed)
-----   Message from Gardiner Ramus, MD sent at 03/03/2020 10:29 AM EDT -----  Regarding: Old Records  Ms Malkiewicz had a thyroid ultrasound and thyroid biopsy in 2016 or 2017 by her prior doctor in [place].    I asked her to call you with that doctor's name and number (she has his card at home), can you please request the old records, I am particularly looking for thyroid ultrasounds and thyroid biopsies.     Thank you,    Molly Maduro

## 2020-03-16 ENCOUNTER — Encounter

## 2020-03-17 MED ORDER — METFORMIN 1,000 MG TAB
1000 mg | ORAL_TABLET | ORAL | 1 refills | Status: DC
Start: 2020-03-17 — End: 2020-09-02

## 2020-03-25 NOTE — Telephone Encounter (Signed)
Received records from Dr. Carloyn Manner her prior PCP.    It reports that pt has prior thyroid nodule with FNA in October 2015 that was read as benign. She had a repeat US in November 2016 which showed no change in thyroid nodule from 2015.    She had right thyroid nodule measuring 1.8 x 1.8 x 1.1 cm and a left sided thyroid nodule measuring 4.7 x 2.6 x 2.9 cm, which was previously biopsied and read as benign.    The right thyroid nodule was NOT biopsied.

## 2020-04-20 ENCOUNTER — Encounter

## 2020-04-20 NOTE — Telephone Encounter (Signed)
 Last visit 03/07/2020 MD Domah   Next appointment 09/08/2020 MD Domah  Previous refill encounter(s)   03/07/2020:  - Diovan -Hctz #30,   - Zocor  #30     Requested Prescriptions     Pending Prescriptions Disp Refills   . valsartan -hydroCHLOROthiazide  (DIOVAN -HCT) 80-12.5 mg per tablet 90 Tablet 1     Sig: Take 1 Tablet by mouth daily. Indications: high blood pressure   . simvastatin  (ZOCOR ) 20 mg tablet 90 Tablet 1     Sig: Take 1 Tablet by mouth nightly. Indications: high cholesterol

## 2020-04-22 MED ORDER — SIMVASTATIN 20 MG TAB
20 mg | ORAL_TABLET | Freq: Every evening | ORAL | 1 refills | Status: DC
Start: 2020-04-22 — End: 2020-09-12

## 2020-04-22 MED ORDER — VALSARTAN-HYDROCHLOROTHIAZIDE 80 MG-12.5 MG TAB
ORAL_TABLET | Freq: Every day | ORAL | 1 refills | Status: DC
Start: 2020-04-22 — End: 2020-09-12

## 2020-04-27 ENCOUNTER — Ambulatory Visit: Attending: Adult Reconstructive Orthopaedic Surgery | Primary: Internal Medicine

## 2020-04-27 ENCOUNTER — Ambulatory Visit
Admit: 2020-04-27 | Discharge: 2020-04-27 | Payer: MEDICARE | Attending: Adult Reconstructive Orthopaedic Surgery | Primary: Internal Medicine

## 2020-04-27 DIAGNOSIS — M1712 Unilateral primary osteoarthritis, left knee: Secondary | ICD-10-CM

## 2020-04-27 NOTE — Progress Notes (Signed)
 Identified pt with two pt identifiers (name and DOB). Reviewed chart in preparation for visit and have obtained necessary documentation.  Wendy Ali is a 79 y.o. female  Chief Complaint   Patient presents with   . Follow-up     LT knee     Visit Vitals  BP (!) 153/71 (BP 1 Location: Right arm, BP Patient Position: Sitting, BP Cuff Size: Large adult)   Pulse 67   Temp (!) 96.3 F (35.7 C) (Tympanic)   Ht 5' 4 (1.626 m)   Wt 185 lb (83.9 kg)   SpO2 99%   BMI 31.76 kg/m     1. Have you been to the ER, urgent care clinic since your last visit?  Hospitalized since your last visit?No    2. Have you seen or consulted any other health care providers outside of the [facility] since your last visit?  Include any pap smears or colon screening. No

## 2020-04-27 NOTE — Progress Notes (Signed)
04/27/2020    Chief Complaint: Left knee pain    HPI: This is a 79 y.o. female who complains of left knee pain. Onset was gradual.  The patient has had activity dependent pain for years.    The patient has tried activity modification, physical therapy exercises, injections have failed to provide good relief.  The pain is in the knee, it is severe in intensity.  The patient feels unstable with the knee, fears falling, and has significant limitation with activities of daily living, recreation, and walks with a limp.    Past Medical History:   Diagnosis Date   ??? Arthritis    ??? Diabetes ([place])    ??? Hypercholesterolemia    ??? Hypertension    ??? Menopause        Past Surgical History:   Procedure Laterality Date   ??? HX BREAST BIOPSY Left 1984    benign   ??? HX ORTHOPAEDIC      knee surgery 2002       Current Outpatient Medications on File Prior to Visit   Medication Sig Dispense Refill   ??? valsartan-hydroCHLOROthiazide (DIOVAN-HCT) 80-12.5 mg per tablet Take 1 Tablet by mouth daily. Indications: high blood pressure 90 Tablet 1   ??? simvastatin (ZOCOR) 20 mg tablet Take 1 Tablet by mouth nightly. Indications: high cholesterol 90 Tablet 1   ??? metFORMIN (GLUCOPHAGE) 1,000 mg tablet TAKE 1 TABLET BY MOUTH  TWICE DAILY WITH MEALS FOR  TYPE 2 DIABETES MELLITUS 180 Tablet 1   ??? celecoxib (CELEBREX) 200 mg capsule Take 1 Capsule by mouth two (2) times a day. 180 Capsule 0   ??? cholecalciferol (VITAMIN D3) (5000 Units /125 mcg) capsule Take 1 Cap by mouth daily. 90 Cap 2   ??? cyanocobalamin 1,000 mcg tablet Take 1,000 mcg by mouth daily.     ??? ascorbic acid, vitamin C, (Vitamin C) 500 mg tablet Take 500 mg by mouth daily.     ??? vitamin E (AQUA GEMS) 400 unit capsule Take 400 Units by mouth daily.     ??? aspirin 81 mg chewable tablet Take 81 mg by mouth daily.     ??? cpap machine kit by Does Not Apply route.       No current facility-administered medications on file prior to visit.       No Known Allergies    Family History   Problem  Relation Age of Onset   ??? No Known Problems Mother         murdered   ??? Stroke Father    ??? Hypertension Father    ??? Diabetes Sister    ??? Hypertension Sister    ??? Hypertension Brother    ??? Heart defect Brother         defibrilattor   ??? Diabetes Brother    ??? Heart Disease Brother         has pacemaker   ??? Ovarian Cancer Niece    ??? Cancer Niece         x2   ??? No Known Problems Maternal Grandmother    ??? No Known Problems Maternal Grandfather    ??? No Known Problems Paternal Grandmother    ??? No Known Problems Paternal Grandfather        Social History     Socioeconomic History   ??? Marital status: SINGLE     Spouse name: Not on file   ??? Number of children: Not on file   ??? Years of education:  Not on file   ??? Highest education level: Not on file   Tobacco Use   ??? Smoking status: Never Smoker   ??? Smokeless tobacco: Never Used   Substance and Sexual Activity   ??? Alcohol use: Yes     Comment: occ wine   ??? Drug use: Never   ??? Sexual activity: Not Currently     Social Determinants of Health     Financial Resource Strain:    ??? Difficulty of Paying Living Expenses:    Food Insecurity:    ??? Worried About Charity fundraiser in the Last Year:    ??? Arboriculturist in the Last Year:    Transportation Needs:    ??? Film/video editor (Medical):    ??? Lack of Transportation (Non-Medical):    Physical Activity:    ??? Days of Exercise per Week:    ??? Minutes of Exercise per Session:    Stress:    ??? Feeling of Stress :    Social Connections:    ??? Frequency of Communication with Friends and Family:    ??? Frequency of Social Gatherings with Friends and Family:    ??? Attends Religious Services:    ??? Marine scientist or Organizations:    ??? Attends Music therapist:    ??? Marital Status:          Review of Systems:       General: Denies headache, lethargy, fever, weight loss  Ears/Nose/Throat: Denies ear discharge, drainage, nosebleeds, hoarse voice, dental problems  Cardiovascular: Denies chest pain, shortness of breath  Lungs:  Denies chest pain, breathing problems, wheezing, pneumonia  Stomach: Denies stomach pain, heartburn, constipation, irritable bowel  Skin: Denies rash, sores, open wounds  Musculoskeletal: Admits to knee pain, no deformity.  Genitourinary: Denies dysuria, hematuria, polyuria  Gastrointestinal: Denies constipation, obstipation, diarrhea  Neurological: Denies changes in sight, smell, hearing, taste, seizures. Denies loss of consciousness.  Psychiatric: Denies depression, sleep pattern changes, anxiety, change in personality  Endocrine: Denies mood swings, heat or cold intolerance  Hematologic/Lymphatic: Denies anemia, purpura, petechia  Allergic/Immunologic: Denies swelling of throat, pain or swelling at lymph nodes      Physical Examination:    Visit Vitals  BP (!) 153/71 (BP 1 Location: Right arm, BP Patient Position: Sitting, BP Cuff Size: Large adult)   Pulse 67   Temp (!) 96.3 ??F (35.7 ??C) (Tympanic)   Ht 5' 4"  (1.626 m)   Wt 185 lb (83.9 kg)   SpO2 99%   BMI 31.76 kg/m??        General: AOX3, no apparent distress  Psychiatric: mood and affect appropriate  Lungs: breathing is symmetric and unlabored bilaterally  Heart: regular rate and rhythm  Abdomen: no guarding  Head: normocephalic, atraumatic  Skin: No significant abnormalities, good turgor  Sensation intact to light touch: L1-S1 dermatomes  Muscular exam: 5/5 strength in all major muscle groups unless noted in specialty exam.    Extremities:      Left upper extremity: Full active and passive range of motion without pain, deformity, no open wound, strength 5/5 in all major muscle groups.    Right upper extremity: Full active and passive range of motion without pain, deformity, no open wound, strength 5/5 in all major muscle groups.    Right lower extremity: Full active and passive range of motion without pain, deformity, no open wound, strength 5/5 in all major muscle groups.    Left lower  extremity:  Valgus deformity is noted.  Range of motion of the knee is  0-100.   Ligamentous testing of the knee indicates stability of the the MCL, LCL, PCL, and ACL. Lachman's, anterior and posterior drawer tests are specifically negative. Lateral joint line tenderness to palpation is noted.  Popliteal area is unremarkable.   1+  for effusion. No patellar crepitus.  Patella tracks centrally.  Pivot shift is negative.  Strength testing is indicative of 5/5 strength at hip flexion, extension, knee flexion and extension, tibialis anterior, EHL, and FHL.  Sensation is intact to light touch in the L1-S1 dermatomes.  Capillary refill is less than 2 seconds in the toes.    Diagnostics:    Pertinent Xrays:  Xrays are available of the left knee.  Bone on bone osteoarthritic changes of the left knee, osteophytes and subchondral sclerosis is noted.    Assessment: Osteoarthritis left knee    Plan:  This patient and I did discuss the many options in treating knee osteoarthritis.  We did discuss that we could continue to seek out nonoperative modalities, such as: NSAIDs, oral and topical analgesics, knee injections, knee braces, physical therapy, stretching, strengthening, and weight loss strategies, activity modification, ambulatory assistive devices.  The patient stated their understanding with this, but would like to proceed with surgical management in the form of a total knee arthroplasty.  We did discuss the risks of surgery which include but are not limited to infection, nerve or blood vessel damage, failure of fixation, failure of any possible implant, need for reoperation, postoperative pain and swelling, intra-or postoperative fracture, postoperative dislocation, leg length inequality, need for reoperation, implant failure, death, disability, organ dysfunction, wound healing issues, DVT, PE, and the need for further procedures.  The patient did freely state their understanding and satisfaction with our discussion.  We will proceed after medical clearances.    The patient was counseled at  length about the risks of contracting Covid-19 during their perioperative period and any recovery window from their procedure.  The patient was made aware that contracting Covid-19  may worsen their prognosis for recovering from their procedure and lend to a higher morbidity and/or mortality risk.  All material risks, benefits, and reasonable alternatives including postponing the procedure were discussed. The patient does  wish to proceed with the procedure at this time.        Wendy Ali has a reminder for a "due or due soon" health maintenance. I have asked that she contact her primary care provider for follow-up on this health maintenance.

## 2020-05-06 NOTE — Telephone Encounter (Signed)
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

## 2020-05-06 NOTE — Telephone Encounter (Signed)
Contacted patient to schedule surgery. Scheduled for 06/13/2020. Advised patient that clearances from PCP would be required, and would need to be received no less that 2 business days before surgery. Patient advised to contact the office(s) to make pre op appts as soon as possible. Patient verbalized understanding and was encouraged to contact our office with any questions or concern. Clearance letter faxed

## 2020-05-20 ENCOUNTER — Ambulatory Visit: Attending: Internal Medicine | Primary: Internal Medicine

## 2020-05-20 ENCOUNTER — Ambulatory Visit: Admit: 2020-05-20 | Payer: MEDICARE | Attending: Internal Medicine | Primary: Internal Medicine

## 2020-05-20 ENCOUNTER — Encounter

## 2020-05-20 DIAGNOSIS — Z01818 Encounter for other preprocedural examination: Secondary | ICD-10-CM

## 2020-05-20 LAB — CBC W/O DIFF
ABSOLUTE NRBC: 0 10*3/uL (ref 0.00–0.01)
HCT: 36.2 % (ref 35.0–47.0)
HGB: 11.7 g/dL (ref 11.5–16.0)
MCH: 27.5 PG (ref 26.0–34.0)
MCHC: 32.3 g/dL (ref 30.0–36.5)
MCV: 85 FL (ref 80.0–99.0)
MPV: 9.8 FL (ref 8.9–12.9)
NRBC: 0 PER 100 WBC
PLATELET: 385 10*3/uL (ref 150–400)
RBC: 4.26 M/uL (ref 3.80–5.20)
RDW: 15.1 % — ABNORMAL HIGH (ref 11.5–14.5)
WBC: 4.6 10*3/uL (ref 3.6–11.0)

## 2020-05-20 LAB — URINALYSIS W/ RFLX MICROSCOPIC
BACTERIA, URINE: NEGATIVE /hpf
Bacteria: NEGATIVE /hpf
Bilirubin, Urine: NEGATIVE
Bilirubin: NEGATIVE
Blood, Urine: NEGATIVE
Blood: NEGATIVE
Glucose, Ur: NEGATIVE mg/dL
Glucose: NEGATIVE mg/dL
Ketone: NEGATIVE mg/dL
Ketones, Urine: NEGATIVE mg/dL
Nitrite, Urine: NEGATIVE
Nitrites: NEGATIVE
Protein, UA: NEGATIVE mg/dL
Protein: NEGATIVE mg/dL
Specific Gravity, UA: 1.012 (ref 1.003–1.030)
Specific gravity: 1.012 (ref 1.003–1.030)
Urobilinogen, UA, POCT: 1 EU/dL (ref 0.2–1.0)
Urobilinogen: 1 EU/dL (ref 0.2–1.0)
pH (UA): 6 (ref 5.0–8.0)
pH, UA: 6 (ref 5.0–8.0)

## 2020-05-20 LAB — METABOLIC PANEL, COMPREHENSIVE
A-G Ratio: 1.2 (ref 1.1–2.2)
ALT (SGPT): 19 U/L (ref 12–78)
AST (SGOT): 21 U/L (ref 15–37)
Albumin: 4.2 g/dL (ref 3.5–5.0)
Alk. phosphatase: 68 U/L (ref 45–117)
Anion gap: 2 mmol/L — ABNORMAL LOW (ref 5–15)
BUN/Creatinine ratio: 19 (ref 12–20)
BUN: 19 MG/DL (ref 6–20)
Bilirubin, total: 0.5 MG/DL (ref 0.2–1.0)
CO2: 28 mmol/L (ref 21–32)
Calcium: 10.1 MG/DL (ref 8.5–10.1)
Chloride: 106 mmol/L (ref 97–108)
Creatinine: 1.01 MG/DL (ref 0.55–1.02)
GFR est AA: >60 mL/min/{1.73_m2} (ref >60)
GFR est non-AA: 53 mL/min/{1.73_m2} — ABNORMAL LOW (ref >60)
Globulin: 3.6 g/dL (ref 2.0–4.0)
Glucose: 118 mg/dL — ABNORMAL HIGH (ref 65–100)
Potassium: 3.8 mmol/L (ref 3.5–5.1)
Protein, total: 7.8 g/dL (ref 6.4–8.2)
Sodium: 136 mmol/L (ref 136–145)

## 2020-05-20 LAB — PROTHROMBIN TIME + INR
INR: 1 (ref 0.9–1.1)
Prothrombin time: 10.4 s (ref 9.0–11.1)

## 2020-05-20 LAB — CBC
Hematocrit: 36.2 % (ref 35.0–47.0)
Hemoglobin: 11.7 g/dL (ref 11.5–16.0)
MCH: 27.5 PG (ref 26.0–34.0)
MCHC: 32.3 g/dL (ref 30.0–36.5)
MCV: 85 FL (ref 80.0–99.0)
MPV: 9.8 FL (ref 8.9–12.9)
NRBC Absolute: 0 10*3/uL (ref 0.00–0.01)
Nucleated RBCs: 0 PER 100 WBC
Platelets: 385 10*3/uL (ref 150–400)
RBC: 4.26 M/uL (ref 3.80–5.20)
RDW: 15.1 % — ABNORMAL HIGH (ref 11.5–14.5)
WBC: 4.6 10*3/uL (ref 3.6–11.0)

## 2020-05-20 LAB — COMPREHENSIVE METABOLIC PANEL
ALT: 19 U/L (ref 12–78)
AST: 21 U/L (ref 15–37)
Albumin/Globulin Ratio: 1.2 (ref 1.1–2.2)
Albumin: 4.2 g/dL (ref 3.5–5.0)
Alkaline Phosphatase: 68 U/L (ref 45–117)
Anion Gap: 2 mmol/L — ABNORMAL LOW (ref 5–15)
BUN: 19 MG/DL (ref 6–20)
Bun/Cre Ratio: 19 (ref 12–20)
CO2: 28 mmol/L (ref 21–32)
Calcium: 10.1 MG/DL (ref 8.5–10.1)
Chloride: 106 mmol/L (ref 97–108)
Creatinine: 1.01 MG/DL (ref 0.55–1.02)
EGFR IF NonAfrican American: 53 mL/min/{1.73_m2} — ABNORMAL LOW (ref >60)
GFR African American: >60 mL/min/{1.73_m2} (ref >60)
Globulin: 3.6 g/dL (ref 2.0–4.0)
Glucose: 118 mg/dL — ABNORMAL HIGH (ref 65–100)
Potassium: 3.8 mmol/L (ref 3.5–5.1)
Sodium: 136 mmol/L (ref 136–145)
Total Bilirubin: 0.5 MG/DL (ref 0.2–1.0)
Total Protein: 7.8 g/dL (ref 6.4–8.2)

## 2020-05-20 LAB — PROTIME-INR
INR: 1 (ref 0.9–1.1)
Protime: 10.4 s (ref 9.0–11.1)

## 2020-05-20 NOTE — Telephone Encounter (Signed)
Left VM for pt to call and schedule appt with Dr. Alfonso Patten. Referral from Dr. Leonor Liv scanned in.    Thanks  [employer]

## 2020-05-20 NOTE — Progress Notes (Signed)
Chief Complaint   Patient presents with   ??? Pre-op Exam     06/13/20 knee surgery   ??? Immunization/Injection     getting HD flu Vacc   ??? Mass     upper back left     HPI:  Wendy Ali is a 79 y.o. AA female with h/o hypertension, hypercholesterolemia, diabetes type 2  presents for pre-op evaluation.   Patient has a knee surgery scheduled 06/13/20 with Dr. Dillon Bjork. Blood pressure is stable but not at goal.   Patient has no complaints of chest pain, shortness os breath, palpitations. She will need clearance from cardiologist for procedure.   She is requesting flu Vaccine. Also, she reports a soft lump on upper left back.    Review of Systems  As per hpi    Past Medical History:   Diagnosis Date   ??? Arthritis    ??? Diabetes ([place])    ??? Hypercholesterolemia    ??? Hypertension    ??? Menopause      Past Surgical History:   Procedure Laterality Date   ??? HX BREAST BIOPSY Left 1984    benign   ??? HX ORTHOPAEDIC      knee surgery 2002     Social History     Socioeconomic History   ??? Marital status: SINGLE   Tobacco Use   ??? Smoking status: Never Smoker   ??? Smokeless tobacco: Never Used   Substance and Sexual Activity   ??? Alcohol use: Yes     Comment: occ wine   ??? Drug use: Never   ??? Sexual activity: Not Currently     Family History   Problem Relation Age of Onset   ??? No Known Problems Mother         murdered   ??? Stroke Father    ??? Hypertension Father    ??? Diabetes Sister    ??? Hypertension Sister    ??? Hypertension Brother    ??? Heart defect Brother         defibrilattor   ??? Diabetes Brother    ??? Heart Disease Brother         has pacemaker   ??? Ovarian Cancer Niece    ??? Cancer Niece         x2   ??? No Known Problems Maternal Grandmother    ??? No Known Problems Maternal Grandfather    ??? No Known Problems Paternal Grandmother    ??? No Known Problems Paternal Grandfather      Current Outpatient Medications   Medication Sig Dispense Refill   ??? valsartan-hydroCHLOROthiazide (DIOVAN-HCT) 80-12.5 mg per tablet Take 1 Tablet by mouth daily.  Indications: high blood pressure 90 Tablet 1   ??? simvastatin (ZOCOR) 20 mg tablet Take 1 Tablet by mouth nightly. Indications: high cholesterol 90 Tablet 1   ??? metFORMIN (GLUCOPHAGE) 1,000 mg tablet TAKE 1 TABLET BY MOUTH  TWICE DAILY WITH MEALS FOR  TYPE 2 DIABETES MELLITUS 180 Tablet 1   ??? celecoxib (CELEBREX) 200 mg capsule Take 1 Capsule by mouth two (2) times a day. 180 Capsule 0   ??? cholecalciferol (VITAMIN D3) (5000 Units /125 mcg) capsule Take 1 Cap by mouth daily. 90 Cap 2   ??? cyanocobalamin 1,000 mcg tablet Take 1,000 mcg by mouth daily.     ??? ascorbic acid, vitamin C, (Vitamin C) 500 mg tablet Take 500 mg by mouth daily.     ??? vitamin E (AQUA GEMS) 400 unit capsule  Take 400 Units by mouth daily.     ??? aspirin 81 mg chewable tablet Take 81 mg by mouth daily.     ??? cpap machine kit by Does Not Apply route.       No Known Allergies    Objective:  Visit Vitals  BP (!) 143/75   Pulse 82   Temp 96.8 ??F (36 ??C) (Temporal)   Resp 20   Ht 5' 4" (1.626 m)   Wt 182 lb (82.6 kg)   SpO2 100%   BMI 31.24 kg/m??     Physical Exam:   General appearance - alert, well appearing in no distress  Mental status - alert, oriented to person, place, and time  Neck - supple, no significant adenopathy   Chest - clear to auscultation, no wheezes, rales or rhonchi  Heart - normal rate, regular rhythm,  no murmurs  Abdomen - soft, nontender, nondistended, no organomegaly  Ext-peripheral pulses normal, no pedal edema  Skin-Warm and dry, upper left back lump with soft consistency suspicious for lipoma.  Neuro - no focal findings   Knee-antalgic gait, soft tissue tenderness over left, reduced range of motion     Results for orders placed or performed in visit on 03/07/20   URINALYSIS W/ RFLX MICROSCOPIC   Result Value Ref Range    Color YELLOW/STRAW      Appearance CLOUDY (A) CLEAR      Specific gravity 1.022 1.003 - 1.030      pH (UA) 5.0 5.0 - 8.0      Protein Negative Negative mg/dL    Glucose Negative Negative mg/dL    Ketone Negative  Negative mg/dL    Bilirubin Negative Negative      Blood Negative Negative      Urobilinogen 1.0 0.2 - 1.0 EU/dL    Nitrites Negative Negative      Leukocyte Esterase MODERATE (A) Negative      WBC 10-20 0 - 4 /hpf    RBC 0-5 0 - 5 /hpf    Epithelial cells FEW FEW /lpf    Bacteria 2+ (A) Negative /hpf   LIPID PANEL   Result Value Ref Range    Cholesterol, total 146 <200 MG/DL    Triglyceride 90 <150 MG/DL    HDL Cholesterol 76 MG/DL    LDL, calculated 52 0 - 100 MG/DL    VLDL, calculated 18 MG/DL    CHOL/HDL Ratio 1.9 0.0 - 5.0     HEMOGLOBIN A1C WITH EAG   Result Value Ref Range    Hemoglobin A1c 5.0 4.0 - 5.6 %    Est. average glucose 97 mg/dL     Assessment/Plan:  Diagnoses and all orders for this visit:    Pre-op evaluation  -     REFERRAL TO CARDIOLOGY  -     CBC W/O DIFF; Future  -     PROTHROMBIN TIME + INR; Future    Needs flu shot  -     FLU (FLUAD QUAD INFLUENZA VACCINE,QUAD,ADJUVANTED)    Hypertension goal BP (blood pressure) < 836/62  -     METABOLIC PANEL, COMPREHENSIVE; Future    Controlled type 2 diabetes mellitus with diabetic nephropathy, without long-term current use of insulin (HCC)  -     METABOLIC PANEL, COMPREHENSIVE; Future    Frequency of urination  -     URINALYSIS W/ RFLX MICROSCOPIC; Future      Patient Instructions   Take 2 tabs of the Valsartan 80 mg a  day     Follow-up and Dispositions    ?? Return in about 3 months (around 08/20/2020) for routine follow up.

## 2020-05-20 NOTE — Telephone Encounter (Signed)
Second VM left for patient to call to schedule an appointment with our Cardiology. Reminder sent out by mail. le

## 2020-05-31 ENCOUNTER — Inpatient Hospital Stay: Admit: 2020-05-31 | Payer: MEDICARE | Attending: Adult Reconstructive Orthopaedic Surgery | Primary: Internal Medicine

## 2020-05-31 ENCOUNTER — Ambulatory Visit: Payer: MEDICARE | Primary: Internal Medicine

## 2020-05-31 DIAGNOSIS — I10 Essential (primary) hypertension: Secondary | ICD-10-CM

## 2020-05-31 LAB — EKG, 12 LEAD, INITIAL
Atrial Rate: 68 {beats}/min
Calculated P Axis: 106 degrees
Calculated R Axis: -20 degrees
Calculated T Axis: 48 degrees
Diagnosis: NORMAL
P-R Interval: 152 ms
Q-T Interval: 404 ms
QRS Duration: 122 ms
QTC Calculation (Bezet): 429 ms
Ventricular Rate: 68 {beats}/min

## 2020-05-31 LAB — TYPE & SCREEN
ABO/Rh(D): B POS
Antibody screen: NEGATIVE

## 2020-05-31 LAB — HEMOGLOBIN A1C WITH EAG
Est. average glucose: 103 mg/dL
Hemoglobin A1c: 5.2 % (ref 4.0–5.6)

## 2020-05-31 LAB — HEMOGLOBIN A1C W/EAG
Hemoglobin A1C: 5.2 % (ref 4.0–5.6)
eAG: 103 mg/dL

## 2020-05-31 LAB — TYPE AND SCREEN
ABO/Rh: B POS
Antibody Screen: NEGATIVE

## 2020-05-31 LAB — EKG 12-LEAD
Atrial Rate: 68 {beats}/min
Diagnosis: NORMAL
P Axis: 106 degrees
P-R Interval: 152 ms
Q-T Interval: 404 ms
QRS Duration: 122 ms
QTc Calculation (Bazett): 429 ms
R Axis: -20 degrees
T Axis: 48 degrees
Ventricular Rate: 68 {beats}/min

## 2020-05-31 NOTE — Interval H&P Note (Signed)
Hibiclens/Chlorhexidine    Preventing Infections Before and After - Your Surgery    IMPORTANT INSTRUCTIONS    Please read and follow these instructions carefully. If you are unable to comply with the below instructions your procedure will be cancelled.       Every Night for Three (3) nights before your surgery:  1. Shower with an antibacterial soap, such as Dial, or the soap provided at your preassessment appointment. A shower is better than a bath for cleaning your skin.  2. If needed, ask someone to help you reach all areas of your body. Don't forget to clean your belly button with every shower.    The night before your surgery:   If you lose your Hibiclens/chlorhexidine please contact surgery center or you can purchase it at a local pharmacy  1. On the night before your surgery, shower with an antibacterial soap, such as Dial, or the soap provided at your preassessment appointment.   2. With one packet of Hibiclens/Chlorhexidine in hand, turn water off.  3. Apply Hibiclens antiseptic skin cleanser with a clean, freshly washed washcloth.  ? Gently apply to your body from chin to toes (except the genital area) and especially the area(s) where your incision(s) will be.  ? Leave Hibiclens/Chlorhexidine on your skin for at least 20 seconds.    CAUTION: If needed, Hibiclens/chlorhexidine may be used to clean the folds of skin of the legs (such as in the area of the groin) and on your buttocks and hips. However, do not use Hibiclens/Chlorhexidine above the neck or in the genital area (your bottom) or put inside any area of your body.  4. Turn the water back on and rinse.  5. Dry gently with a clean, freshly washed towel.  6. After your shower, do not use any powder, deodorant, perfumes or lotion.  7. Use clean, freshly washed towels and washcloths every time you shower.  8. Wear clean, freshly washed pajamas to bed the night before surgery.  9. Sleep on clean, freshly washed sheets.  10. Do not allow pets to sleep in  your bed with you.        The Morning of your surgery:  1. Shower again thoroughly with an antibacterial soap, such as Dial or the soap provided at your preassessment appointment. If needed, ask someone for help to reach all areas of your body. Don't forget to clean your belly button! Rinse.  2. Dry gently with a clean, freshly washed towel.  3. After your shower, do not use any powder, deodorant, perfumes or lotion prior to surgery.  4. Put on clean, freshly washed clothing.    Tips to help prevent infections after your surgery:  1. Protect your surgical wound from germs:  ? Hand washing is the most important thing you and your caregivers can do to prevent infections.  ? Keep your bandage clean and dry!  ? Do not touch your surgical wound.  2. Use clean, freshly washed towels and washcloths every time you shower; do not share bath linens with others.  3. Until your surgical wound is healed, wear clothing and sleep on bed linens each day that are clean and freshly washed.  4. Do not allow pets to sleep in your bed with you or touch your surgical wound.  5. Do not smoke - smoking delays wound healing. This may be a good time to stop smoking.  6. If you have diabetes, it is important for you to manage your blood sugar levels  properly before your surgery as well as after your surgery. Poorly managed blood sugar levels slow down wound healing and prevent you from healing completely.    If you lose your Hibiclens/chlorhexidine, please call the Surgery Center, or it is available for purchase at your pharmacy.               ___________________      ___________________      ________________  (Signature of Patient)          (Witness)                   (Date and Time)

## 2020-05-31 NOTE — Interval H&P Note (Signed)
Periop Notes  by Leonides Grills, RN at 05/31/20 1030                Author: Leonides Grills, RN  Service: --  Author Type: Registered Nurse       Filed: 05/31/20 1035  Date of Service: 05/31/20 1030  Status: Signed          Editor: Leonides Grills, RN (Registered Nurse)                                   [facility]   Joint/Spine Preoperative Instructions      COVID TEST/MOB 1   06/08/20 Wednesday   Anytime between 7 am and 11:45 am      Surgery Date 06/13/20         Time of [place]   Contact # [phone] home      1. On the day of your surgery, please report to the Surgical Services Registration Desk and sign in at your designated time. The Surgery Center is located to the right of the Emergency  Room.       2. You must have someone with you to drive you home. You should not drive a car for 24 hours following surgery. Please make arrangements for a friend or family member to stay with you for the first 24 hours after your surgery.      3. No food after midnight 06/12/20.  Medications morning of surgery should be taken with a sip of water.  Please follow pre-surgery drink instructions that were given at your Pre Admission Testing appointment.        4. We recommend you do not drink any alcoholic beverages for 24 hours before and after your surgery.      5. Contact your surgeons office for instructions on the following medications: non-steroidal anti-inflammatory drugs (i.e. Advil, Aleve), vitamins, and supplements. (Some surgeons will want you to stop these medications prior to surgery  and others may allow you to take them)   **If you are currently taking Plavix, Coumadin, Aspirin and/or other blood-thinning agents, contact your surgeon for instructions.** Your surgeon will partner with the physician prescribing  these medications to determine if it is safe to stop or if you need to continue taking.  Please do not stop taking these medications without instructions from your surgeon      6.  Wear comfortable clothes.  Wear glasses instead of contacts.  Do not bring any money or jewelry. Please bring picture ID, insurance card, and any prearranged co-payment or hospital payment.  Do not wear make-up, particularly mascara the morning of  your surgery.  Do not wear nail polish, particularly if you are having foot /hand surgery.  Wear your hair loose or down, no ponytails, buns, bobby pins or clips.  All body piercings must be removed.   Please shower with antibacterial soap for three consecutive days before and on the morning of surgery, but do not apply any lotions, powders or deodorants after the shower on the day of surgery.  Please use a fresh towels after each shower. Please sleep in clean clothes and change bed linens the night before surgery.  Please do not shave for 48 hours prior to surgery. Shaving of the face is  acceptable.      7. You should understand that if you do not follow these instructions your surgery may be cancelled.  If your physical condition changes (I.e. fever, cold or flu) please contact your surgeon as soon as possible.      8. It is important that you be on time.  If a situation occurs where you may be late, please call [phone] (OR Holding Area).      9. If you have any questions and or problems, please call [phone] (Pre-admission Testing).      10. Your surgery time may be subject to change.  You will receive a phone call the evening prior if your time changes.      11.  If having outpatient surgery, you must have someone to drive you here, stay with you during the duration of your stay, and to drive you home at time of discharge.      12. The following link is for the educational video for patients and/or families.     [email or web address]      Special Instructions:    Bring c-pap day of surgery    Bring joint/binder back to the hospital for use   Verify  with surgeon concerning aspirin instructions   TAKE ALL MEDICATIONS THE DAY OF [place] (follow surgeon instructions for aspirin/supplements)         I understand a pre-operative phone call will be made to verify my surgery time.  In the event that I am not available, I give permission for a message to be left on my answering service and/or with another person? yes              ___________________      __________   _________     (Signature of Patient)             (Witness)                (Date and Time)

## 2020-05-31 NOTE — Interval H&P Note (Signed)
 Orthopedic and Spine Patients:  Instructions on When You Can   Eat or Drink Before Surgery      You have been provided a pre-surgery drink received at your pre-admission testing appointment.    . Night before surgery:  o You should drink 1/2 bottle of the  pre-surgery drink at bedtime. No food after midnight!        . Day of Surgery:  o Complete 2nd half of the bottle of the pre-surgery drink 1 hour prior to arrival at hospital.      For questions call Pre-Admission Testing at [phone].  They are available from 8:00am-5:00pm, Monday through Friday.

## 2020-05-31 NOTE — Interval H&P Note (Signed)
Left message with Dr. Joanne Chars office to clarify aspirin/supplements prior to surgery.

## 2020-05-31 NOTE — Telephone Encounter (Signed)
PAT called regarding patient, questions about stopping medications before surgery    Please give a call back  [phone]

## 2020-05-31 NOTE — Progress Notes (Signed)
PAT Nurse Practitioner   Pre-Operative Chart Review/Assessment:-ORTHOPEDIC/NEUROSURGICAL SPINE                Patient Name:  Wendy Ali                                                           Age:   79 y.o.    DOB:  March 13, 1941     Today's Date:  06/02/2020     Date of PAT:   05/31/20     Date of Surgery:    06/13/2020      Procedure(s):  Left  Total Knee Arthroplasty     Surgeon:   Dillon Bjork     Medical Clearance:  Dr. Leonor Liv                   PLAN:      1)  Cardiac Clearance:  Scheduled w/ Dr. Colonel Bald for 06/07/20 at 1000       2)  Program for Diabetes Health Consult:  Not indicated-A1C 5.2      3)  Sleep Apnea evaluation:   Has dx of OSA-Compliant w/ CPAP      4) Treatment for MRSA/Staph Aureus:  Negative      5) Additional Concerns:  OSA (compliant), T2DM, hx of vertigo, hypercalcemia, hyperparathyroidism                 Vital Signs:         Visit Vitals  BP 138/79 (BP 1 Location: Left upper arm, BP Patient Position: Sitting)   Pulse 88   Temp 98.5 ??F (36.9 ??C)   Resp 20   Ht '5\' 4"'$  (1.626 m)   Wt 83.1 kg (183 lb 3.2 oz)   SpO2 100%   BMI 31.45 kg/m??                        ____________________________________________  PAST MEDICAL HISTORY  Past Medical History:   Diagnosis Date   ??? Arthritis    ??? Diabetes ([place])    ??? Hypercalcemia    ??? Hypercholesterolemia    ??? Hyperparathyroidism ([place])    ??? Hypertension    ??? Menopause    ??? Sleep apnea     cpap   ??? Vertigo     past hx      ____________________________________________  PAST SURGICAL HISTORY  Past Surgical History:   Procedure Laterality Date   ??? HX BREAST BIOPSY Left 1984    benign   ??? HX OTHER SURGICAL      colonoscopy   ??? PR TOTAL KNEE ARTHROPLASTY Right 2002      ____________________________________________  HOME MEDICATIONS    Current Outpatient Medications   Medication Sig   ??? valsartan-hydroCHLOROthiazide (DIOVAN-HCT) 80-12.5 mg per tablet Take 1 Tablet by mouth daily. Indications: high blood pressure   ??? simvastatin (ZOCOR) 20 mg tablet Take 1 Tablet by  mouth nightly. Indications: high cholesterol   ??? metFORMIN (GLUCOPHAGE) 1,000 mg tablet TAKE 1 TABLET BY MOUTH  TWICE DAILY WITH MEALS FOR  TYPE 2 DIABETES MELLITUS   ??? cholecalciferol (VITAMIN D3) (5000 Units /125 mcg) capsule Take 1 Cap by mouth daily.   ??? cyanocobalamin 1,000 mcg tablet Take 1,000 mcg by mouth daily.   ??? ascorbic acid, vitamin  C, (Vitamin C) 500 mg tablet Take 500 mg by mouth daily.   ??? vitamin E (AQUA GEMS) 400 unit capsule Take 400 Units by mouth daily.   ??? aspirin 81 mg chewable tablet Take 81 mg by mouth daily.   ??? cpap machine kit by Does Not Apply route.     No current facility-administered medications for this encounter.      ____________________________________________  ALLERGIES  No Known Allergies   ____________________________________________  SOCIAL HISTORY  Social History     Tobacco Use   ??? Smoking status: Never Smoker   ??? Smokeless tobacco: Never Used   Substance Use Topics   ??? Alcohol use: Yes     Alcohol/week: 1.0 standard drink     Types: 1 Glasses of wine per week     Comment: once a month      ____________________________________________  COVID VACCINATION STATUS:      Internal Administration   First Dose COVID-19, Moderna, Primary or Immunocompromised Series, MRNA, PF, 157mg/0.5mL  08/21/2019   Second Dose COVID-19, Moderna, Primary or Immunocompromised Series, MRNA, PF, 1040m/0.5mL  09/18/2019      Last COVID Lab No results found for: SARSCOV2, COV2NT, RCBraymerCVD2M, COLittle ChuteXPLCVT, SARoodhouseSABentonCORadcliffXJHurman HornSAWheat[facility] Hospital[facility] Visit on 05/31/2020   Component Date Value Ref Range Status   ??? Special Requests: 05/31/2020 NO SPECIAL REQUESTS    Final   ??? Culture result: 05/31/2020 MRSA NOT PRESENT    Final   ??? Culture result: 05/31/2020 Screening of patient nares for MRSA is for surveillance purposes and, if positive, to facilitate isolation considerations in high risk settings. It is not intended for  automatic decolonization interventions per se as regimens are not sufficiently effective to warrant routine use.    Final   ??? Ventricular Rate 05/31/2020 68  BPM Final   ??? Atrial Rate 05/31/2020 68  BPM Final   ??? P-R Interval 05/31/2020 152  ms Final   ??? QRS Duration 05/31/2020 122  ms Final   ??? Q-T Interval 05/31/2020 404  ms Final   ??? QTC Calculation (Bezet) 05/31/2020 429  ms Final   ??? Calculated P Axis 05/31/2020 106  degrees Final   ??? Calculated R Axis 05/31/2020 -20  degrees Final   ??? Calculated T Axis 05/31/2020 48  degrees Final   ??? Diagnosis 05/31/2020    Final                    Value:Normal sinus rhythm  Left ventricular hypertrophy with QRS widening    No previous ECGs available  Confirmed by HaComer [phone] 05/31/2020 4:36:11 PM     ??? Hemoglobin A1c 05/31/2020 5.2  4.0 - 5.6 % Final    Comment: NEW METHOD  PLEASE NOTE NEW REFERENCE RANGE  (NOTE)  HbA1C Interpretive Ranges  <5.7              Normal  5.7 - 6.4         Consider Prediabetes  >6.5              Consider Diabetes     ??? Est. average glucose 05/31/2020 103  mg/dL Final   ??? Crossmatch Expiration 05/31/2020 06/14/2020,2359   Final   ??? ABO/Rh(D) 05/31/2020 B POSITIVE   Final   ??? Antibody screen 05/31/2020 NEG  Final   Orders Only on 05/20/2020   Component Date Value Ref Range Status   ??? Color 05/20/2020 YELLOW/STRAW    Final    Color Reference Range: Straw, Yellow or Dark Yellow   ??? Appearance 05/20/2020 CLEAR  CLEAR   Final   ??? Specific gravity 05/20/2020 1.012  1.003 - 1.030   Final   ??? pH (UA) 05/20/2020 6.0  5.0 - 8.0   Final   ??? Protein 05/20/2020 Negative  Negative mg/dL Final   ??? Glucose 05/20/2020 Negative  Negative mg/dL Final   ??? Ketone 05/20/2020 Negative  Negative mg/dL Final   ??? Bilirubin 05/20/2020 Negative  Negative   Final   ??? Blood 05/20/2020 Negative  Negative   Final   ??? Urobilinogen 05/20/2020 1.0  0.2 - 1.0 EU/dL Final   ??? Nitrites 05/20/2020 Negative  Negative   Final   ??? Leukocyte Esterase 05/20/2020 SMALL*  Negative   Final   ??? WBC 05/20/2020 0-4  0 - 4 /hpf Final   ??? RBC 05/20/2020 0-5  0 - 5 /hpf Final   ??? Epithelial cells 05/20/2020 FEW  FEW /lpf Final    Comment: Epithelial cell category consists of squamous cells and /or transitional  urothelial cells. Renal tubular cells, if present, are separately identified as  such.     ??? Bacteria 05/20/2020 Negative  Negative /hpf Final   ??? Hyaline cast 05/20/2020 0-2  0 - 5 /lpf Final   ??? INR 05/20/2020 1.0  0.9 - 1.1   Final    Comment: A single therapeutic range for Vit K antagonists may not be optimal for all  indications - see June, 2008 issue of Chest, American College of Chest  Physicians Evidence-Based Clinical Practice Guidelines, 8th Edition.     ??? Prothrombin time 05/20/2020 10.4  9.0 - 11.1 sec Final   ??? WBC 05/20/2020 4.6  3.6 - 11.0 K/uL Final   ??? RBC 05/20/2020 4.26  3.80 - 5.20 M/uL Final   ??? HGB 05/20/2020 11.7  11.5 - 16.0 g/dL Final   ??? HCT 05/20/2020 36.2  35.0 - 47.0 % Final   ??? MCV 05/20/2020 85.0  80.0 - 99.0 FL Final   ??? MCH 05/20/2020 27.5  26.0 - 34.0 PG Final   ??? MCHC 05/20/2020 32.3  30.0 - 36.5 g/dL Final   ??? RDW 05/20/2020 15.1* 11.5 - 14.5 % Final   ??? PLATELET 05/20/2020 385  150 - 400 K/uL Final   ??? MPV 05/20/2020 9.8  8.9 - 12.9 FL Final   ??? NRBC 05/20/2020 0.0  0 PER 100 WBC Final   ??? ABSOLUTE NRBC 05/20/2020 0.00  0.00 - 0.01 K/uL Final   ??? Sodium 05/20/2020 136  136 - 145 mmol/L Final   ??? Potassium 05/20/2020 3.8  3.5 - 5.1 mmol/L Final   ??? Chloride 05/20/2020 106  97 - 108 mmol/L Final   ??? CO2 05/20/2020 28  21 - 32 mmol/L Final   ??? Anion gap 05/20/2020 2* 5 - 15 mmol/L Final   ??? Glucose 05/20/2020 118* 65 - 100 mg/dL Final   ??? BUN 05/20/2020 19  6 - 20 MG/DL Final   ??? Creatinine 05/20/2020 1.01  0.55 - 1.02 MG/DL Final   ??? BUN/Creatinine ratio 05/20/2020 19  12 - 20   Final   ??? GFR est AA 05/20/2020 >60  >60 ml/min/1.33m Final   ??? GFR est non-AA 05/20/2020 53* >60 ml/min/1.764mFinal    Comment: Estimated GFR is  calculated using the  IDMS-traceable Modification of Diet in  Renal Disease (MDRD) Study equation, reported for both African Americans  (GFRAA) and non-African Americans (GFRNA), and normalized to 1.75m body  surface area. The physician must decide which value applies to the patient.     ??? Calcium 05/20/2020 10.1  8.5 - 10.1 MG/DL Final   ??? Bilirubin, total 05/20/2020 0.5  0.2 - 1.0 MG/DL Final   ??? ALT (SGPT) 05/20/2020 19  12 - 78 U/L Final   ??? AST (SGOT) 05/20/2020 21  15 - 37 U/L Final   ??? Alk. phosphatase 05/20/2020 68  45 - 117 U/L Final   ??? Protein, total 05/20/2020 7.8  6.4 - 8.2 g/dL Final   ??? Albumin 05/20/2020 4.2  3.5 - 5.0 g/dL Final   ??? Globulin 05/20/2020 3.6  2.0 - 4.0 g/dL Final   ??? A-G Ratio 05/20/2020 1.2  1.1 - 2.2   Final        XR Results (most recent):    Results from Hospital Encounter encounter on 11/18/19    XR KNEE LT MIN 4 V    Narrative  EXAM: XR KNEE LT MIN 4 V    INDICATION: pain.    COMPARISON: None.    FINDINGS: Four views of the left knee demonstrate loss of lateral joint space  with spurs. Milder changes medially. Early DJD patella with diffuse surrounding  soft tissues are swelling and a small effusion. Incidental vascular  calcification, prostate of the right knee. Vascular calcification are prominent.    Impression  Severe DJD..Marland Kitchen        Skin:   Denies open wounds, cuts, sores, rashes or other areas of concern in PAT assessment.        KStarling Manns NP

## 2020-05-31 NOTE — Interval H&P Note (Signed)
Periop Notes  by Leonides Grills, RN at 05/31/20 1030                Author: Leonides Grills, RN  Service: --  Author Type: Registered Nurse       Filed: 05/31/20 1031  Date of Service: 05/31/20 1030  Status: Signed          Editor: Leonides Grills, RN (Registered Nurse)               Incentive Spirometer              Using the incentive spirometer helps expand the small air sacs of your lungs, helps you breathe deeply, and helps improve your lung function.   Use your incentive spirometer twice a day (10 breaths each time) prior to surgery.        How to Use Your Incentive Spirometer:   1.  Hold the incentive spirometer in an upright position.    2.  Breathe out as usual.    3.  Place the mouthpiece in your mouth and seal your lips tightly around it.    4.  Take a deep breath.  Breathe in slowly and as deeply as possible. Keep the blue flow rate guide between the arrows.    5.  Hold your breath as long as possible. Then exhale slowly and allow the piston to fall to the bottom of the column.    6.  Rest for a few seconds and repeat steps one through five at least 10 times.       PAT [place] Volume_____2000 _____________  x_____2___________  Date________11/16/21 _______________      Margretta Sidle THE INCENTIVE SPIROMETER WITH YOU TO THE HOSPITAL ON THE DAY OF YOUR SURGERY.   Opportunity given to ask and answer questions as well as to observe return demonstration.      Patient signature_____________________________    Witness____________________________

## 2020-05-31 NOTE — Progress Notes (Signed)
 [facility]  Physical Therapy Pre-surgery evaluation  [address]  [place], [place] [address]    PHYSICAL THERAPY PRE TKR SURGERY EVALUATION  Patient: Wendy Ali [age] y.o. 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   Discussed anticipated disposition to home with possible discharge within a 1 to 2 day time frame post-surgery. Patient and coach in agreement. Husband present today and states he will assist at discharge.     GOALS: (Goals have been discussed and agreed upon with patient.)  DISCHARGE GOALS: Time Frame: 1 DAY  1. Patient will demonstrate increased strength, range of motion, and pain control via a home exercise program in order to minimize functional deficits in preparation for their upcoming surgery. This will be achieved by using education, demonstration and through the use of an informational handout including a home exercise program.  REHABILITATION POTENTIAL FOR STATED GOALS: Good     RECOMMENDATIONS AND PLANNED INTERVENTIONS: (Benefits and precautions of physical therapy have been discussed with the patient.)  1. Home Exercise Program  TREATMENT PLAN EFFECTIVE DATES: 05/31/2020 TO 05/31/2020  FREQUENCY/DURATION: Patient to continue to perform home exercise program at least twice daily until her surgery.     SUBJECTIVE:   Patient stated "I had my other knee done in 2002."    OBJECTIVE DATA SUMMARY:   HISTORY:    Past Medical History:   Diagnosis Date   . Arthritis    . Diabetes ([facility])    . Hypercholesterolemia    . Hypertension    . Menopause    . Sleep apnea     cpap   . Vertigo     past hx     Past Surgical History:   Procedure Laterality Date    . HX BREAST BIOPSY Left 1984    benign   . HX OTHER SURGICAL      colonoscopy   . PR TOTAL KNEE ARTHROPLASTY Right 2002     Prior Level of Function/Home Situation: Independent w/ ambulation and ADLs. History of 1 fall which occurred on uneven ground. Lives with husband who will assist at discharge. Still driving.   Personal factors and/or comorbidities impacting plan of care:     Patient []    does  [x]    does not state signs/symptoms of shortness of breath/dyspnea on exertion/respiratory distress.    Home Situation  Home Environment: Private residence  # Steps to Enter: 0  Wheelchair Ramp: Yes  One/Two Story Residence: One story  Living Alone: No  Support Systems: Spouse/Significant Other  Patient Expects to be Discharged to:: House  Current DME Used/Available at Home: CPAP, Blood pressure cuff, Grab bars  Tub or Shower Type: Shower (built in seat)      EXAMINATION/PRESENTATION/DECISION MAKING:     ADLs (Current Functional Status):   Bathing/Showering:   [x]  Independent  []  Requires Assistance from Someone  []  Sponge Bath Only   Ambulation:  [x]  Independent  []  Walk Indoors Only  []  Walk Outdoors  []  Use Assistive Device  []  Use Wheelchair Only     Dressing:  [x]  Independent    Requires Assistance from Someone for:  []  Sock/Shoes  []   Pants  []  Everything   Household Activities:  [x]  Routine house and yard work  []  Light Housework Only  []  None       Critical Behavior:                Strength:    Strength: Within functional limits                    Tone & Sensation:   Tone: Normal              Sensation: Intact               Range Of Motion:  AROM: Within functional limits                       Coordination:  Coordination: Within functional limits    Functional Mobility:  Transfers:  Sit to Stand: Modified independent  Stand to Sit: Modified independent                       Balance:   Sitting: Intact  Standing: Intact  Ambulation/Gait Training:                                                    Therapeutic  Exercises:   The patient was educated in, has demonstrated, and has received written instructions to complete for their home exercise program per total knee replacement protocol.      Functional Measure:  Timed up and go:    Timed Get Up And Go Test: 0       < than 10 seconds=Normal  Greater then 13.5 seconds (in elderly)=Increased fall risk   Shumway-Cook A, Cherylin RAMAN, [place] M. Predicting the probability for falls in community dwelling older adults using the Timed Up and Go Test. Phys Ther. 2000;80:896-903.           Pain:  Pain Scale 1: Numeric (0 - 10)  Pain Intensity 1: 0                Activity Tolerance:   good  Patient []    does  [x]    does not demonstrate signs/symptoms of shortness of breath/dyspnea on exertion/respiratory distress.  COMMUNICATION/EDUCATION:   The patient was educated on:  [x]          Importance of post-operative mobility to achieve their desired outcomes and restore biological function  [x]          The key post-operative time frame to address ROM to prevent additional complications    The patient's plan of care was discussed with:   [x]          The patient verbalized understanding of her plan in preparation for their upcoming surgery  [x]          The patient's coach was present for this session  []          The patient reports that he/she does not have a coach identified at this time  [x]          The coach verbalized understanding of the education regarding the patient's upcoming surgery  [x]          Patient/family agree to work toward stated goals and plan of care.  []          Patient understands intent and goals  of therapy, but is neutral about his/her participation.  []          Patient is unable to participate in goal setting and plan of care.    Thank you for this referral.  Rollene CHRISTELLA Pride, PT , DPT   Time Calculation: 17 mins

## 2020-06-01 LAB — CULTURE, MRSA

## 2020-06-07 ENCOUNTER — Ambulatory Visit: Attending: Cardiovascular Disease | Primary: Internal Medicine

## 2020-06-07 ENCOUNTER — Ambulatory Visit

## 2020-06-07 ENCOUNTER — Ambulatory Visit
Admit: 2020-06-07 | Discharge: 2020-06-07 | Payer: MEDICARE | Attending: Cardiovascular Disease | Primary: Internal Medicine

## 2020-06-07 ENCOUNTER — Ambulatory Visit: Admit: 2020-06-07 | Discharge: 2020-06-07 | Payer: MEDICARE | Primary: Internal Medicine

## 2020-06-07 DIAGNOSIS — I10 Essential (primary) hypertension: Secondary | ICD-10-CM

## 2020-06-07 NOTE — Progress Notes (Signed)
Please call the patient and inform that echocardiogram is normal, ejection fraction is 60-65%. No concern for heart failure at this time.  Valves working appropriately.    Thanks,  Tika

## 2020-06-07 NOTE — Progress Notes (Signed)
 Identified pt with two pt identifiers(name and DOB). Reviewed record in preparation for visit and have obtained necessary documentation.  Chief Complaint   Patient presents with   . New Patient     cardiac clearance for knee operation         Vitals:    06/07/20 1010   BP: 132/72   Pulse: 76   Resp: 19   Temp: 98 F (36.7 C)   TempSrc: Oral   SpO2: 98%   Weight: 186 lb (84.4 kg)   Height: 5' 4 (1.626 m)   PainSc:   0 - No pain       Health Maintenance Due   Topic   . Shingrix Vaccine Age 8> (1 of 2)   . COVID-19 Vaccine (3 - Booster for Moderna series)       Coordination of Care Questionnaire:  :   1) Have you been to an emergency room, urgent care, or hospitalized since your last visit?  If yes, where when, and reason for visit? no       2. Have seen or consulted any other health care provider since your last visit?   If yes, where when, and reason for visit?  YES      Patient is accompanied by self I have received verbal consent from Dotty R Kutner to discuss any/all medical information while they are present in the room.

## 2020-06-07 NOTE — Progress Notes (Signed)
Progress Notes by Annamarie Major, MD at 06/07/20 1000                Author: Annamarie Major, MD  Service: --  Author Type: Physician       Filed: 06/08/20 0910  Encounter Date: 06/07/2020  Status: Signed          Editor: Annamarie Major, MD (Physician)                                      Loralee Pacas, FNP-BC      Subjective/HPI:       Wendy Ali is a 79 y.o.  female is here to establish care. She has a PMHx of DM2, HTN, HLD.      Here for pre-operative clearance for left total knee replacement.   No previous known cardiac history.  Reports no chest pain symptoms, orthopnea, PND or edema.  Denies dizziness, palpitation symptoms.  Denies shortness of breath.  Not very active due to limitations from left knee arthritis.        Current Outpatient Medications on File Prior to Visit          Medication  Sig  Dispense  Refill           ?  valsartan-hydroCHLOROthiazide (DIOVAN-HCT) 80-12.5 mg per tablet  Take 1 Tablet by mouth daily. Indications: high blood pressure  90 Tablet  1     ?  simvastatin (ZOCOR) 20 mg tablet  Take 1 Tablet by mouth nightly. Indications: high cholesterol  90 Tablet  1     ?  metFORMIN (GLUCOPHAGE) 1,000 mg tablet  TAKE 1 TABLET BY MOUTH  TWICE DAILY WITH MEALS FOR  TYPE 2 DIABETES MELLITUS  180 Tablet  1     ?  cholecalciferol (VITAMIN D3) (5000 Units /125 mcg) capsule  Take 1 Cap by mouth daily.  90 Cap  2     ?  cyanocobalamin 1,000 mcg tablet  Take 1,000 mcg by mouth daily.         ?  ascorbic acid, vitamin C, (Vitamin C) 500 mg tablet  Take 500 mg by mouth daily.         ?  vitamin E (AQUA GEMS) 400 unit capsule  Take 400 Units by mouth daily.               ?  aspirin 81 mg chewable tablet  Take 81 mg by mouth daily.               ?  cpap machine kit  by Does Not Apply route.              No current facility-administered medications on file prior to visit.           Review of Symptoms:      Review of Systems    Constitutional: Negative  for chills, fever and weight loss.    HENT: Negative for nosebleeds.     Eyes: Negative for blurred vision and double vision.    Respiratory: Negative for cough, shortness of breath and wheezing.     Cardiovascular: Negative for chest pain, palpitations, orthopnea, leg swelling and PND.    Skin: Negative for rash.    Neurological: Negative for dizziness and loss of consciousness.          Physical Exam:  General: Well developed, in no acute distress, cooperative and alert   Heart:  reg rate and rhythm; normal S1/S2; no murmurs, no gallops or rubs.    Respiratory: Clear bilaterally x 4, no wheezing or rales   Extremities:  Normal cap refill, no cyanosis, atraumatic.  No edema.   Vascular: 2+ pulses symmetric in all extremities        Vitals:          06/07/20 1010        BP:  132/72     BP 1 Location:  Left upper arm     BP Patient Position:  Sitting     BP Cuff Size:  Adult     Pulse:  76     Temp:  98 ??F (36.7 ??C)     TempSrc:  Oral     Resp:  19     Height:  5' 4"  (1.626 m)     Weight:  186 lb (84.4 kg)        SpO2:  98%           ECG done today shows sinus rhythm; incomplete RBBB         Assessment:                  ICD-10-CM  ICD-9-CM             1.  Essential hypertension   I10  401.9  AMB POC EKG ROUTINE W/ 12 LEADS, INTER & REP                NUCLEAR CARDIAC STRESS TEST           ECHO ADULT COMPLETE           2.  Mixed hyperlipidemia   E78.2  272.2       3.  Pre-operative clearance   Z01.818  V72.84  NUCLEAR CARDIAC STRESS TEST                ECHO ADULT COMPLETE              Plan:        1. Essential hypertension   BP controlled.  Continue anti-hypertensive therapy and low sodium diet      2. Mixed hyperlipidemia   Continue statin therapy and low fat, low cholesterol diet   Lipids managed by PCP      3. Pre-operative clearance   Will obtain lexiscan stress test, echocardiogram   May be considered low risk from CV standpoint for upcoming procedure so long as stress test is a low or intermediate risk  procedure and echo is without gross abnormalities .  May hold ASA 5 days prior to procedure, resume afterwards.      F/u with Dr. Colonel Bald PRN if testing is normal      Preston Fleeting, NP         [facility]                 Patient seen, examined by me personally. Plan discussed as detailed. Agree with note as outlined by  NP with modifications as noted. My independent physical exam reveals :  Physical Exam   Vitals and nursing note reviewed.   Constitutional:        Appearance: Normal appearance.    Cardiovascular:       Rate and Rhythm: Regular rhythm.      Heart sounds: Normal heart sounds.    Pulmonary:  Breath sounds: Normal breath sounds.   Musculoskeletal:       Right lower leg: No edema.      Left lower leg: No edema.    Skin:      General: Skin is warm and dry.   Neurological :       Mental Status: She is alert and oriented to person, place, and time.    Psychiatric:         Mood and Affect: Mood normal.              No additional findings noted. Agree with plan as outlined above with modifications as noted.       Elysse Polidore Ramond Dial, MD

## 2020-06-08 ENCOUNTER — Ambulatory Visit

## 2020-06-08 ENCOUNTER — Ambulatory Visit: Admit: 2020-06-08 | Discharge: 2020-06-08 | Payer: MEDICARE | Primary: Internal Medicine

## 2020-06-08 ENCOUNTER — Inpatient Hospital Stay: Admit: 2020-06-06 | Payer: MEDICARE | Primary: Internal Medicine

## 2020-06-08 DIAGNOSIS — Z01812 Encounter for preprocedural laboratory examination: Secondary | ICD-10-CM

## 2020-06-08 DIAGNOSIS — I10 Essential (primary) hypertension: Secondary | ICD-10-CM

## 2020-06-08 LAB — ECHO ADULT COMPLETE
AR Max Velocity PISA: 350.66 cm/s
AR PHT: 747.63 ms
AV Area by Peak Velocity: 2.49 cm2
AV Peak Gradient: 8.32 mmHg
AV Peak Velocity: 144.18 cm/s
AV R PG: 49.19 mmHg
AVA/BSA Peak Velocity: 1.3 cm2/m2
Aortic Root: 2.77 cm
Ascending Aorta: 2.68 cm
E/E' Lateral: 6.12
E/E' Ratio (Averaged): 7.24
E/E' Septal: 8.36
Est. RA Pressure: 3 mmHg
IVSd: 1.25 cm — AB (ref 0.6–0.9)
LA Area 4C: 28.33 cm2
LA Major Axis: 3.68 cm
LA Minor Axis: 1.94 cm
LA Volume 2C: 69.83 mL — AB (ref 22–52)
LA Volume 4C: 96.75 mL — AB (ref 22–52)
LA Volume BP: 90.64 mL (ref 22–52)
LA Volume DISK BP: 83.48 mL (ref 22–52)
LA Volume Index 2C: 36.75 ml/m2 (ref 16–28)
LA Volume Index 4C: 50.92 ml/m2 (ref 16–28)
LA Volume Index BP: 47.71 ml/m2 (ref 16–28)
LV E' Lateral Velocity: 10.01 cm/s
LV E' Septal Velocity: 7.33 cm/s
LV Mass 2D Index: 81.1 g/m2 (ref 43–95)
LV Mass 2D: 154 g (ref 67–162)
LVIDd: 3.64 cm — AB (ref 3.9–5.3)
LVIDs: 2.49 cm
LVOT Diameter: 1.96 cm
LVOT Peak Gradient: 5.22 mmHg
LVOT Peak Velocity: 114.22 cm/s
LVOT SV: 77.9 mL
LVOT VTI: 25.72 cm
LVPWd: 1.26 cm — AB (ref 0.6–0.9)
Left Ventricular Outflow Tract Mean Gradient: 2.69 mmHg
MV A Velocity: 110.33 cm/s
MV E Velocity: 61.26 cm/s
MV E Wave Deceleration Time: 269.92 ms
MV E/A: 0.56
RVSP: 32.45 mmHg
TAPSE: 1.87 cm (ref 1.5–2.0)
TR Max Velocity: 271.35 cm/s
TR Peak Gradient: 29.45 mmHg

## 2020-06-08 LAB — TRANSTHORACIC ECHOCARDIOGRAM (TTE) COMPLETE (CONTRAST/BUBBLE/3D PRN)
AR Max Velocity PISA: 350.66 cm/s
AR PHT: 747.63 ms
AV Area by Peak Velocity: 2.49 cm2
AV Peak Gradient: 8.32 mmHg
AV Peak Velocity: 144.18 cm/s
AV R PG: 49.19 mmHg
AVA/BSA Peak Velocity: 1.3 cm2/m2
Aortic Root: 2.77 cm
Ascending Aorta: 2.68 cm
E/E' Lateral: 6.12
E/E' Ratio (Averaged): 7.24
E/E' Septal: 8.36
Est. RA Pressure: 3 mmHg
IVSd: 1.25 cm — AB (ref 0.6–0.9)
LA Area 4C: 28.33 cm2
LA Major Axis: 3.68 cm
LA Minor Axis: 1.94 cm
LA Volume 2C: 69.83 mL — AB (ref 22–52)
LA Volume 4C: 96.75 mL — AB (ref 22–52)
LA Volume BP: 83.48 mL (ref 22–52)
LA Volume BP: 90.64 mL (ref 22–52)
LA Volume Index 2C: 36.75 ml/m2 (ref 16–28)
LA Volume Index 4C: 50.92 ml/m2 (ref 16–28)
LA Volume Index BP: 47.71 ml/m2 (ref 16–28)
LV E' Lateral Velocity: 10.01 cm/s
LV E' Septal Velocity: 7.33 cm/s
LV Mass 2D Index: 81.1 g/m2 (ref 43–95)
LV Mass 2D: 154 g (ref 67–162)
LVIDd: 3.64 cm — AB (ref 3.9–5.3)
LVIDs: 2.49 cm
LVOT Diameter: 1.96 cm
LVOT Mean Gradient: 2.69 mmHg
LVOT Peak Gradient: 5.22 mmHg
LVOT Peak Velocity: 114.22 cm/s
LVOT SV: 77.9 mL
LVOT VTI: 25.72 cm
LVPWd: 1.26 cm — AB (ref 0.6–0.9)
Left Ventricular Ejection Fraction: 63
MV A Velocity: 110.33 cm/s
MV E Velocity: 61.26 cm/s
MV E Wave Deceleration Time: 269.92 ms
MV E/A: 0.56
RVSP: 32.45 mmHg
TAPSE: 1.87 cm (ref 1.5–2)
TR Max Velocity: 271.35 cm/s
TR Peak Gradient: 29.45 mmHg

## 2020-06-08 MED ORDER — TECHNETIUM TC-99M TETROFOSMIN 1.38 MG IV SOLUTION KIT
1.38 mg | Freq: Once | INTRAVENOUS | Status: AC
Start: 2020-06-08 — End: 2020-06-08
  Administered 2020-06-08: 15:00:00 via INTRAVENOUS

## 2020-06-08 MED ORDER — REGADENOSON 0.4 MG/5 ML IV SYRINGE
0.4 mg/5 mL | Freq: Once | INTRAVENOUS | Status: AC
Start: 2020-06-08 — End: 2020-06-08
  Administered 2020-06-08: 15:00:00 via INTRAVENOUS

## 2020-06-08 MED ORDER — TECHNETIUM TC-99M TETROFOSMIN 1.38 MG IV SOLUTION KIT
1.38 mg | Freq: Once | INTRAVENOUS | Status: AC
Start: 2020-06-08 — End: 2020-06-08
  Administered 2020-06-08: 14:00:00 via INTRAVENOUS

## 2020-06-08 NOTE — Progress Notes (Signed)
Please call the patient and inform that stress test is normal.  Low concern for significant blockages in the heart arteries at this time.  May be considered low risk from CV standpoint for upcoming procedure.  May hold ASA 5 days prior to procedure, resume afterwards.      Thanks,  [employer]

## 2020-06-08 NOTE — Telephone Encounter (Signed)
-----   Message from Preston Fleeting, NP sent at 06/08/2020  1:14 PM EST -----  Please call the patient and inform that echocardiogram is normal, ejection fraction is 60-65%. No concern for heart failure at this time.  Valves working appropriately.    Thanks,  [employer]

## 2020-06-08 NOTE — Telephone Encounter (Signed)
Incoming from [facility], Nurse with Dr. Myles Gip. Per Kayla confused with clearance noted in last office noted received via fax. Two patient identifiers verified. Per Wendy Ali patient has scheduled scheduled on Monday and would like to check the status of cardiac clearance. Wendy Ali was advised cardiac clearance is currently pending and waiting results of Lexiscan performed today.     Per [facility] office will be closed on Thursday and Friday and Dr. Dillon Bjork will be out of town until Monday morning. Contact information for on call provider Dr. Milagros Reap received from [place]. Nurse was advised on call provider will be notified of cardiac clearance status by NP Martha Clan McGuiness once [place] results are finalized. Kayla informed verbalized understanding.

## 2020-06-08 NOTE — Progress Notes (Signed)
Spoke with Dr. Shayne Alken office, pt had their stress test done today. The NP put a note in that it was good, but they are going to go over it with the Dr to make sure on whether or not the pt is cleared for surgery on Monday 06/13/2020. Dr. Dillon Bjork will not be back in office until Monday morning when he begins surgery, so I gave Dr. Milagros Reap number to them, so he can put a note in pt's chart as so whether she is clear or not.

## 2020-06-08 NOTE — Interval H&P Note (Signed)
Called Kayla at Dr [facility] office and informed her that nuclear stress test has not resulted yet. Kayla to follow up with cardiologist. Per Dorathy Daft at Dr Malachy Chamber office, they want me to call pt with TOA pending nuclear stress test results.

## 2020-06-08 NOTE — Telephone Encounter (Signed)
Verified Patient with two identifiers  Spoke with patient regarding results and recommendations. Patient voiced understanding.

## 2020-06-08 NOTE — Unmapped (Signed)
Formatting of this note might be different from the original.  Jaclyn Prime at Dr [facility] office and informed her that nuclear stress test has not resulted yet. Kayla to follow up with cardiologist. Per Lonn Georgia at Dr Joanne Chars office, they want me to call pt with TOA pending nuclear stress test results.   Electronically signed by Rockne Menghini, RN at 06/08/2020  4:07 PM EST

## 2020-06-09 LAB — SARS-COV-2: SARS-CoV-2: NOT DETECTED

## 2020-06-09 LAB — NUCLEAR CARDIAC STRESS TEST
Baseline Diastolic BP: 80 mmHg
Baseline HR: 64 {beats}/min
Baseline Systolic BP: 140 mmHg
Stress Diastolic BP: 74 mmHg
Stress Peak HR: 106 {beats}/min
Stress Percent HR Achieved: 76 %
Stress Rate Pressure Product: 15900 bpm*mmHg
Stress ST Depression: 0 mm
Stress ST Elevation: 0 mm
Stress Systolic BP: 150 mmHg
Stress Target HR: 139 {beats}/min

## 2020-06-09 LAB — NM STRESS TEST WITH MYOCARDIAL PERFUSION
Baseline Diastolic BP: 80 mmHg
Baseline HR: 64 {beats}/min
Baseline Systolic BP: 140 mmHg
Left Ventricular Ejection Fraction: 68
Stress Diastolic BP: 74 mmHg
Stress Peak HR: 106 {beats}/min
Stress Percent HR Achieved: 76 %
Stress Rate Pressure Product: 15900 bpm*mmHg
Stress ST Depression: 0 mm
Stress ST Elevation: 0 mm
Stress Systolic BP: 150 mmHg
Stress Target HR: 139 {beats}/min

## 2020-06-09 LAB — COVID-19: SARS-CoV-2: NOT DETECTED

## 2020-06-10 NOTE — Telephone Encounter (Signed)
SurgicalClearance notes, EKG along with last office visit notes faxed to [facility], Dr. Bary Richard to [phone].

## 2020-06-10 NOTE — Telephone Encounter (Signed)
This writer attempted to contact in reference to test results as noted below. This writer was not able to reach patient at this time, a voicemail message was left advising patient to contact the office    Attempted to fax clearance note to Dr. Dillon Bjork, [facility]. Faxed would not go through, will attempt to fax at a later time.     ----- Message from Preston Fleeting, NP sent at 06/10/2020  9:14 AM EST -----  Please call the patient and inform that stress test is normal.  Low concern for significant blockages in the heart arteries at this time.  May be considered low risk from CV standpoint for upcoming procedure.  May hold ASA 5 days prior to procedure, resume afterwards.

## 2020-06-10 NOTE — Telephone Encounter (Signed)
-----   Message from Preston Fleeting, NP sent at 06/10/2020  9:14 AM EST -----  Please call the patient and inform that stress test is normal.  Low concern for significant blockages in the heart arteries at this time.  May be considered low risk from CV standpoint for upcoming procedure.  May hold ASA 5 days prior to procedure, resume afterwards.      Thanks,  [employer]

## 2020-06-10 NOTE — Telephone Encounter (Signed)
Incoming call from patient, two patient identifiers verified. Wendy Ali was informed of the following results per NP Martha Clan Z McGuiness,     Please call the patient and inform that stress test is normal.  Low concern for significant blockages in the heart arteries at this time.  May be considered low risk from CV standpoint for upcoming procedure.  May hold ASA 5 days prior to procedure, resume afterwards.     Patient verbalized understanding and denies questions at this time. Clearance documentation faxed to the office of Dr. Dillon Bjork

## 2020-06-12 NOTE — H&P (Signed)
Formatting of this note is different from the original.  Date of Admission: 06/12/2020    Chief Complaint: Left knee pain    HPI: This is a 79 y.o. female who complains of left knee pain. Onset was gradual.  The patient has had activity dependent pain for years.    The patient has tried activity modification, physical therapy exercises, injections have failed to provide good relief.  The pain is in the knee, it is severe in intensity.  The patient feels unstable with the knee, fears falling, and has significant limitation with activities of daily living, recreation, and walks with a limp.    Past Medical History:   Diagnosis Date   ? Arthritis    ? Diabetes ([place])    ? Hypercalcemia    ? Hypercholesterolemia    ? Hyperparathyroidism ([place])    ? Hypertension    ? Menopause    ? Sleep apnea     cpap   ? Vertigo     past hx     Past Surgical History:   Procedure Laterality Date   ? HX BREAST BIOPSY Left 1984    benign   ? HX OTHER SURGICAL      colonoscopy   ? PR TOTAL KNEE ARTHROPLASTY Right 2002     No current facility-administered medications on file prior to encounter.     Current Outpatient Medications on File Prior to Encounter   Medication Sig Dispense Refill   ? valsartan-hydroCHLOROthiazide (DIOVAN-HCT) 80-12.5 mg per tablet Take 1 Tablet by mouth daily. Indications: high blood pressure 90 Tablet 1   ? simvastatin (ZOCOR) 20 mg tablet Take 1 Tablet by mouth nightly. Indications: high cholesterol 90 Tablet 1   ? metFORMIN (GLUCOPHAGE) 1,000 mg tablet TAKE 1 TABLET BY MOUTH  TWICE DAILY WITH MEALS FOR  TYPE 2 DIABETES MELLITUS 180 Tablet 1   ? cholecalciferol (VITAMIN D3) (5000 Units /125 mcg) capsule Take 1 Cap by mouth daily. 90 Cap 2   ? cyanocobalamin 1,000 mcg tablet Take 1,000 mcg by mouth daily.     ? ascorbic acid, vitamin C, (Vitamin C) 500 mg tablet Take 500 mg by mouth daily.     ? vitamin E (AQUA GEMS) 400 unit capsule Take 400 Units by mouth daily.     ? aspirin 81 mg chewable tablet Take 81 mg by  mouth daily.     ? cpap machine kit by Does Not Apply route.       No Known Allergies    Family History   Problem Relation Age of Onset   ? No Known Problems Mother         murdered   ? Stroke Father    ? Hypertension Father    ? Diabetes Sister    ? Hypertension Sister    ? Hypertension Brother    ? Heart defect Brother         defibrilattor   ? Diabetes Brother    ? Heart Disease Brother         has pacemaker   ? Ovarian Cancer Niece    ? Cancer Niece         x2   ? No Known Problems Maternal Grandmother    ? No Known Problems Maternal Grandfather    ? No Known Problems Paternal Grandmother    ? No Known Problems Paternal Grandfather      Social History     Socioeconomic History   ? Marital status: MARRIED  Tobacco Use   ? Smoking status: Never Smoker   ? Smokeless tobacco: Never Used   Vaping Use   ? Vaping Use: Never used   Substance and Sexual Activity   ? Alcohol use: Yes     Alcohol/week: 1.0 standard drink     Types: 1 Glasses of wine per week     Comment: once a month   ? Drug use: Never   ? Sexual activity: Not Currently     Review of Systems:     General: Denies headache, lethargy, fever, weight loss  Ears/Nose/Throat: Denies ear discharge, drainage, nosebleeds, hoarse voice, dental problems  Cardiovascular: Denies chest pain, shortness of breath  Lungs: Denies chest pain, breathing problems, wheezing, pneumonia  Stomach: Denies stomach pain, heartburn, constipation, irritable bowel  Skin: Denies rash, sores, open wounds  Musculoskeletal: Admits to knee pain, no deformity.  Genitourinary: Denies dysuria, hematuria, polyuria  Gastrointestinal: Denies constipation, obstipation, diarrhea  Neurological: Denies changes in sight, smell, hearing, taste, seizures. Denies loss of consciousness.  Psychiatric: Denies depression, sleep pattern changes, anxiety, change in personality  Endocrine: Denies mood swings, heat or cold intolerance  Hematologic/Lymphatic: Denies anemia, purpura,  petechia  Allergic/Immunologic: Denies swelling of throat, pain or swelling at lymph nodes    Physical Examination:    There were no vitals taken for this visit.     General: AOX3, no apparent distress  Psychiatric: mood and affect appropriate  Lungs: breathing is symmetric and unlabored bilaterally  Heart: regular rate and rhythm  Abdomen: no guarding  Head: normocephalic, atraumatic  Skin: No significant abnormalities, good turgor  Sensation intact to light touch: L1-S1 dermatomes  Muscular exam: 5/5 strength in all major muscle groups unless noted in specialty exam.    Extremities:    Left upper extremity: Full active and passive range of motion without pain, deformity, no open wound, strength 5/5 in all major muscle groups.    Right upper extremity: Full active and passive range of motion without pain, deformity, no open wound, strength 5/5 in all major muscle groups.    Right lower extremity: Full active and passive range of motion without pain, deformity, no open wound, strength 5/5 in all major muscle groups.    Left lower extremity:  Valgus deformity is noted.  Range of motion of the knee is limited.   Ligamentous testing of the knee indicates stability of the the MCL, LCL, PCL, and ACL. Lachman's, anterior and posterior drawer tests are specifically negative. Medial joint line tenderness to palpation is noted.  Popliteal area is unremarkable.   1+  for effusion. No patellar crepitus.  Patella tracks centrally with a negative apprehension and grind test.  Pivot shift is negative.  Strength testing is indicative of 5/5 strength at hip flexion, extension, knee flexion and extension, tibialis anterior, EHL, and FHL.  Sensation is intact to light touch in the L1-S1 dermatomes.  Capillary refill is less than 2 seconds in the toes.    Diagnostics:    Pertinent Xrays:  Xrays are available of the left knee.  Bone on bone osteoarthritic changes of the left knee, osteophytes and subchondral sclerosis is  noted.    Assessment: Osteoarthritis left knee    Plan:  This patient and I did discuss the many options in treating knee osteoarthritis.  We did discuss that we could continue to seek out nonoperative modalities, such as: NSAIDs, oral and topical analgesics, knee injections, knee braces, physical therapy, stretching, strengthening, and weight loss strategies, activity modification, ambulatory  assistive devices.  The patient stated their understanding with this, but would like to proceed with surgical management in the form of a total knee arthroplasty.  We did discuss the risks of surgery which include but are not limited to infection, nerve or blood vessel damage, failure of fixation, failure of any possible implant, need for reoperation, postoperative pain and swelling, intra-or postoperative fracture, postoperative dislocation, leg length inequality, need for reoperation, implant failure, death, disability, organ dysfunction, wound healing issues, DVT, PE, and the need for further procedures.  The patient did freely state their understanding and satisfaction with our discussion.  We will proceed after medical clearances.    The patient was counseled at length about the risks of contracting Covid-19 during their perioperative period and any recovery window from their procedure.  The patient was made aware that contracting Covid-19  may worsen their prognosis for recovering from their procedure and lend to a higher morbidity and/or mortality risk.  All material risks, benefits, and reasonable alternatives including postponing the procedure were discussed. The patient does  wish to proceed with the procedure at this time.      Electronically signed by Aaron Mose, DO at 06/12/2020  6:38 PM EST

## 2020-06-12 NOTE — H&P (Signed)
Date of Admission: 06/12/2020    Chief Complaint: Left knee pain    HPI: This is a 79 y.o. female who complains of left knee pain. Onset was gradual.  The patient has had activity dependent pain for years.    The patient has tried activity modification, physical therapy exercises, injections have failed to provide good relief.  The pain is in the knee, it is severe in intensity.  The patient feels unstable with the knee, fears falling, and has significant limitation with activities of daily living, recreation, and walks with a limp.    Past Medical History:   Diagnosis Date   ??? Arthritis    ??? Diabetes ([place])    ??? Hypercalcemia    ??? Hypercholesterolemia    ??? Hyperparathyroidism ([place])    ??? Hypertension    ??? Menopause    ??? Sleep apnea     cpap   ??? Vertigo     past hx       Past Surgical History:   Procedure Laterality Date   ??? HX BREAST BIOPSY Left 1984    benign   ??? HX OTHER SURGICAL      colonoscopy   ??? PR TOTAL KNEE ARTHROPLASTY Right 2002       No current facility-administered medications on file prior to encounter.     Current Outpatient Medications on File Prior to Encounter   Medication Sig Dispense Refill   ??? valsartan-hydroCHLOROthiazide (DIOVAN-HCT) 80-12.5 mg per tablet Take 1 Tablet by mouth daily. Indications: high blood pressure 90 Tablet 1   ??? simvastatin (ZOCOR) 20 mg tablet Take 1 Tablet by mouth nightly. Indications: high cholesterol 90 Tablet 1   ??? metFORMIN (GLUCOPHAGE) 1,000 mg tablet TAKE 1 TABLET BY MOUTH  TWICE DAILY WITH MEALS FOR  TYPE 2 DIABETES MELLITUS 180 Tablet 1   ??? cholecalciferol (VITAMIN D3) (5000 Units /125 mcg) capsule Take 1 Cap by mouth daily. 90 Cap 2   ??? cyanocobalamin 1,000 mcg tablet Take 1,000 mcg by mouth daily.     ??? ascorbic acid, vitamin C, (Vitamin C) 500 mg tablet Take 500 mg by mouth daily.     ??? vitamin E (AQUA GEMS) 400 unit capsule Take 400 Units by mouth daily.     ??? aspirin 81 mg chewable tablet Take 81 mg by mouth daily.     ??? cpap machine kit by Does Not Apply  route.         No Known Allergies    Family History   Problem Relation Age of Onset   ??? No Known Problems Mother         murdered   ??? Stroke Father    ??? Hypertension Father    ??? Diabetes Sister    ??? Hypertension Sister    ??? Hypertension Brother    ??? Heart defect Brother         defibrilattor   ??? Diabetes Brother    ??? Heart Disease Brother         has pacemaker   ??? Ovarian Cancer Niece    ??? Cancer Niece         x2   ??? No Known Problems Maternal Grandmother    ??? No Known Problems Maternal Grandfather    ??? No Known Problems Paternal Grandmother    ??? No Known Problems Paternal Grandfather        Social History     Socioeconomic History   ??? Marital status: MARRIED   Tobacco  Use   ??? Smoking status: Never Smoker   ??? Smokeless tobacco: Never Used   Vaping Use   ??? Vaping Use: Never used   Substance and Sexual Activity   ??? Alcohol use: Yes     Alcohol/week: 1.0 standard drink     Types: 1 Glasses of wine per week     Comment: once a month   ??? Drug use: Never   ??? Sexual activity: Not Currently         Review of Systems:       General: Denies headache, lethargy, fever, weight loss  Ears/Nose/Throat: Denies ear discharge, drainage, nosebleeds, hoarse voice, dental problems  Cardiovascular: Denies chest pain, shortness of breath  Lungs: Denies chest pain, breathing problems, wheezing, pneumonia  Stomach: Denies stomach pain, heartburn, constipation, irritable bowel  Skin: Denies rash, sores, open wounds  Musculoskeletal: Admits to knee pain, no deformity.  Genitourinary: Denies dysuria, hematuria, polyuria  Gastrointestinal: Denies constipation, obstipation, diarrhea  Neurological: Denies changes in sight, smell, hearing, taste, seizures. Denies loss of consciousness.  Psychiatric: Denies depression, sleep pattern changes, anxiety, change in personality  Endocrine: Denies mood swings, heat or cold intolerance  Hematologic/Lymphatic: Denies anemia, purpura, petechia  Allergic/Immunologic: Denies swelling of throat, pain or  swelling at lymph nodes      Physical Examination:    There were no vitals taken for this visit.     General: AOX3, no apparent distress  Psychiatric: mood and affect appropriate  Lungs: breathing is symmetric and unlabored bilaterally  Heart: regular rate and rhythm  Abdomen: no guarding  Head: normocephalic, atraumatic  Skin: No significant abnormalities, good turgor  Sensation intact to light touch: L1-S1 dermatomes  Muscular exam: 5/5 strength in all major muscle groups unless noted in specialty exam.    Extremities:      Left upper extremity: Full active and passive range of motion without pain, deformity, no open wound, strength 5/5 in all major muscle groups.    Right upper extremity: Full active and passive range of motion without pain, deformity, no open wound, strength 5/5 in all major muscle groups.    Right lower extremity: Full active and passive range of motion without pain, deformity, no open wound, strength 5/5 in all major muscle groups.    Left lower extremity:  Valgus deformity is noted.  Range of motion of the knee is limited.   Ligamentous testing of the knee indicates stability of the the MCL, LCL, PCL, and ACL. Lachman's, anterior and posterior drawer tests are specifically negative. Medial joint line tenderness to palpation is noted.  Popliteal area is unremarkable.   1+  for effusion. No patellar crepitus.  Patella tracks centrally with a negative apprehension and grind test.  Pivot shift is negative.  Strength testing is indicative of 5/5 strength at hip flexion, extension, knee flexion and extension, tibialis anterior, EHL, and FHL.  Sensation is intact to light touch in the L1-S1 dermatomes.  Capillary refill is less than 2 seconds in the toes.    Diagnostics:    Pertinent Xrays:  Xrays are available of the left knee.  Bone on bone osteoarthritic changes of the left knee, osteophytes and subchondral sclerosis is noted.    Assessment: Osteoarthritis left knee    Plan:  This patient and I  did discuss the many options in treating knee osteoarthritis.  We did discuss that we could continue to seek out nonoperative modalities, such as: NSAIDs, oral and topical analgesics, knee injections, knee braces, physical therapy,  stretching, strengthening, and weight loss strategies, activity modification, ambulatory assistive devices.  The patient stated their understanding with this, but would like to proceed with surgical management in the form of a total knee arthroplasty.  We did discuss the risks of surgery which include but are not limited to infection, nerve or blood vessel damage, failure of fixation, failure of any possible implant, need for reoperation, postoperative pain and swelling, intra-or postoperative fracture, postoperative dislocation, leg length inequality, need for reoperation, implant failure, death, disability, organ dysfunction, wound healing issues, DVT, PE, and the need for further procedures.  The patient did freely state their understanding and satisfaction with our discussion.  We will proceed after medical clearances.      The patient was counseled at length about the risks of contracting Covid-19 during their perioperative period and any recovery window from their procedure.  The patient was made aware that contracting Covid-19  may worsen their prognosis for recovering from their procedure and lend to a higher morbidity and/or mortality risk.  All material risks, benefits, and reasonable alternatives including postponing the procedure were discussed. The patient does  wish to proceed with the procedure at this time.

## 2020-06-13 ENCOUNTER — Ambulatory Visit: Admit: 2020-06-13 | Payer: MEDICARE | Primary: Internal Medicine

## 2020-06-13 ENCOUNTER — Inpatient Hospital Stay: Payer: MEDICARE

## 2020-06-13 LAB — GLUCOSE, POC
Glucose (POC): 138 mg/dL — ABNORMAL HIGH (ref 65–117)
Glucose (POC): 103 mg/dL (ref 65–117)
Glucose (POC): 173 mg/dL — ABNORMAL HIGH (ref 65–117)

## 2020-06-13 LAB — POCT GLUCOSE
POC Glucose: 138 mg/dL — ABNORMAL HIGH (ref 65–117)
POC Glucose: 103 mg/dL (ref 65–117)
POC Glucose: 173 mg/dL — ABNORMAL HIGH (ref 65–117)

## 2020-06-13 MED ORDER — POLYETHYLENE GLYCOL 3350 17 GRAM (100 %) ORAL POWDER PACKET
17 gram | Freq: Every day | ORAL | Status: DC
Start: 2020-06-13 — End: 2020-06-14

## 2020-06-13 MED ORDER — NALOXONE 0.4 MG/ML INJECTION
0.4 mg/mL | INTRAMUSCULAR | Status: DC | PRN
Start: 2020-06-13 — End: 2020-06-14

## 2020-06-13 MED ORDER — HYDROMORPHONE (PF) 2 MG/ML IJ SOLN
2 mg/mL | INTRAMUSCULAR | Status: DC | PRN
Start: 2020-06-13 — End: 2020-06-13
  Administered 2020-06-13 (×3): via INTRAVENOUS

## 2020-06-13 MED ORDER — SENNOSIDES-DOCUSATE SODIUM 8.6 MG-50 MG TAB
Freq: Two times a day (BID) | ORAL | Status: DC
Start: 2020-06-13 — End: 2020-06-14

## 2020-06-13 MED ORDER — CYANOCOBALAMIN 500 MCG TAB
500 mcg | Freq: Every day | ORAL | Status: DC
Start: 2020-06-13 — End: 2020-06-14
  Administered 2020-06-14: 15:00:00 via ORAL

## 2020-06-13 MED ORDER — CEFAZOLIN 1 GRAM SOLUTION FOR INJECTION
1 gram | Freq: Three times a day (TID) | INTRAMUSCULAR | Status: AC
Start: 2020-06-13 — End: 2020-06-14
  Administered 2020-06-13 – 2020-06-14 (×2): via INTRAVENOUS

## 2020-06-13 MED ORDER — ASPIRIN 325 MG TAB, DELAYED RELEASE
325 mg | Freq: Two times a day (BID) | ORAL | Status: DC
Start: 2020-06-13 — End: 2020-06-14
  Administered 2020-06-14: 15:00:00 via ORAL

## 2020-06-13 MED ORDER — LACTATED RINGERS IV
INTRAVENOUS | Status: DC
Start: 2020-06-13 — End: 2020-06-13

## 2020-06-13 MED ORDER — METFORMIN 500 MG TAB
500 mg | Freq: Every day | ORAL | Status: DC
Start: 2020-06-13 — End: 2020-06-14
  Administered 2020-06-14: 15:00:00 via ORAL

## 2020-06-13 MED ORDER — ONDANSETRON (PF) 4 MG/2 ML INJECTION
4 mg/2 mL | INTRAMUSCULAR | Status: DC | PRN
Start: 2020-06-13 — End: 2020-06-13
  Administered 2020-06-13: 15:00:00 via INTRAVENOUS

## 2020-06-13 MED ORDER — SODIUM CHLORIDE 0.9 % IV
INTRAVENOUS | Status: DC
Start: 2020-06-13 — End: 2020-06-13

## 2020-06-13 MED ORDER — HYDROMORPHONE 0.5 MG/0.5 ML SYRINGE
0.5 mg/ mL | INTRAMUSCULAR | Status: DC | PRN
Start: 2020-06-13 — End: 2020-06-14

## 2020-06-13 MED ORDER — DEXAMETHASONE SODIUM PHOSPHATE 4 MG/ML IJ SOLN
4 mg/mL | INTRAMUSCULAR | Status: DC | PRN
Start: 2020-06-13 — End: 2020-06-13
  Administered 2020-06-13: 15:00:00 via INTRAVENOUS

## 2020-06-13 MED ORDER — DIPHENHYDRAMINE HCL 50 MG/ML IJ SOLN
50 mg/mL | Freq: Four times a day (QID) | INTRAMUSCULAR | Status: AC | PRN
Start: 2020-06-13 — End: 2020-06-14

## 2020-06-13 MED ORDER — ACETAMINOPHEN 500 MG TAB
500 mg | Freq: Four times a day (QID) | ORAL | Status: DC
Start: 2020-06-13 — End: 2020-06-14
  Administered 2020-06-14 (×3): via ORAL

## 2020-06-13 MED ORDER — BISACODYL 10 MG RECTAL SUPPOSITORY
10 mg | Freq: Every day | RECTAL | Status: DC | PRN
Start: 2020-06-13 — End: 2020-06-14

## 2020-06-13 MED ORDER — ALCOHOL 62% (NOZIN) NASAL SANITIZER
62 % | Freq: Two times a day (BID) | CUTANEOUS | Status: DC
Start: 2020-06-13 — End: 2020-06-14
  Administered 2020-06-13 – 2020-06-14 (×3): via TOPICAL

## 2020-06-13 MED ORDER — SODIUM CHLORIDE 0.9% BOLUS IV
0.9 % | Freq: Once | INTRAVENOUS | Status: AC | PRN
Start: 2020-06-13 — End: 2020-06-14

## 2020-06-13 MED ORDER — ROPIVACAINE (PF) 5 MG/ML (0.5 %) INJECTION
5 mg/mL (0. %) | INTRAMUSCULAR | Status: DC | PRN
Start: 2020-06-13 — End: 2020-06-13
  Administered 2020-06-13: 17:00:00 via INTRA_ARTICULAR

## 2020-06-13 MED ORDER — LIDOCAINE (PF) 20 MG/ML (2 %) IJ SOLN
20 mg/mL (2 %) | INTRAMUSCULAR | Status: AC
Start: 2020-06-13 — End: ?

## 2020-06-13 MED ORDER — FENTANYL CITRATE (PF) 50 MCG/ML IJ SOLN
50 mcg/mL | INTRAMUSCULAR | Status: DC | PRN
Start: 2020-06-13 — End: 2020-06-13

## 2020-06-13 MED ORDER — SODIUM CHLORIDE 0.9 % IJ SYRG
INTRAMUSCULAR | Status: DC | PRN
Start: 2020-06-13 — End: 2020-06-13

## 2020-06-13 MED ORDER — WATER FOR INJECTION, STERILE INJECTION
1 gram | Freq: Once | INTRAMUSCULAR | Status: AC
Start: 2020-06-13 — End: 2020-06-13
  Administered 2020-06-13: 15:00:00 via INTRAVENOUS

## 2020-06-13 MED ORDER — ROPIVACAINE (PF) 5 MG/ML (0.5 %) INJECTION
5 mg/mL (0. %) | INTRAMUSCULAR | Status: AC
Start: 2020-06-13 — End: 2020-06-13
  Administered 2020-06-13: 15:00:00 via PERINEURAL

## 2020-06-13 MED ORDER — ROPIVACAINE (PF) 5 MG/ML (0.5 %) INJECTION
5 mg/mL (0. %) | INTRAMUSCULAR | Status: DC | PRN
Start: 2020-06-13 — End: 2020-06-13

## 2020-06-13 MED ORDER — ONDANSETRON 4 MG TAB, RAPID DISSOLVE
4 mg | Freq: Four times a day (QID) | ORAL | Status: DC | PRN
Start: 2020-06-13 — End: 2020-06-14
  Administered 2020-06-13 – 2020-06-14 (×2): via ORAL

## 2020-06-13 MED ORDER — PROPOFOL 10 MG/ML IV EMUL
10 mg/mL | INTRAVENOUS | Status: AC
Start: 2020-06-13 — End: ?

## 2020-06-13 MED ORDER — ASCORBIC ACID 500 MG TAB
500 mg | Freq: Every day | ORAL | Status: DC
Start: 2020-06-13 — End: 2020-06-14
  Administered 2020-06-14: 15:00:00 via ORAL

## 2020-06-13 MED ORDER — KETAMINE 100 MG/ML IJ SOLN
100 mg/mL | INTRAMUSCULAR | Status: DC | PRN
Start: 2020-06-13 — End: 2020-06-13
  Administered 2020-06-13: 15:00:00 via ORAL

## 2020-06-13 MED ORDER — ACETAMINOPHEN 325 MG TABLET
325 mg | Freq: Once | ORAL | Status: DC
Start: 2020-06-13 — End: 2020-06-13

## 2020-06-13 MED ORDER — ONDANSETRON (PF) 4 MG/2 ML INJECTION
4 mg/2 mL | INTRAMUSCULAR | Status: AC
Start: 2020-06-13 — End: ?

## 2020-06-13 MED ORDER — SODIUM CHLORIDE 0.9 % IJ SYRG
Freq: Three times a day (TID) | INTRAMUSCULAR | Status: DC
Start: 2020-06-13 — End: 2020-06-13

## 2020-06-13 MED ORDER — ACETAMINOPHEN 500 MG TAB
500 mg | Freq: Once | ORAL | Status: AC
Start: 2020-06-13 — End: 2020-06-13
  Administered 2020-06-13: 14:00:00 via ORAL

## 2020-06-13 MED ORDER — ROPIVACAINE (PF) 5 MG/ML (0.5 %) INJECTION
5 mg/mL (0. %) | INTRAMUSCULAR | Status: AC
Start: 2020-06-13 — End: ?

## 2020-06-13 MED ORDER — TRANEXAMIC ACID 1,000 MG/10 ML (100 MG/ML) IV
1000 mg/10 mL (100 mg/mL) | INTRAVENOUS | Status: AC
Start: 2020-06-13 — End: ?

## 2020-06-13 MED ORDER — CHOLECALCIFEROL (VITAMIN D3) 1,000 UNIT (25 MCG) TAB
Freq: Every day | ORAL | Status: DC
Start: 2020-06-13 — End: 2020-06-14
  Administered 2020-06-14: 15:00:00 via ORAL

## 2020-06-13 MED ORDER — FENTANYL CITRATE (PF) 50 MCG/ML IJ SOLN
50 mcg/mL | INTRAMUSCULAR | Status: AC
Start: 2020-06-13 — End: ?

## 2020-06-13 MED ORDER — ALCOHOL 62% (NOZIN) NASAL SANITIZER
62 % | Freq: Once | CUTANEOUS | Status: DC
Start: 2020-06-13 — End: 2020-06-13

## 2020-06-13 MED ORDER — DIPHENHYDRAMINE HCL 50 MG/ML IJ SOLN
50 mg/mL | INTRAMUSCULAR | Status: DC | PRN
Start: 2020-06-13 — End: 2020-06-13

## 2020-06-13 MED ORDER — PROCHLORPERAZINE EDISYLATE 5 MG/ML INJECTION
5 mg/mL | Freq: Once | INTRAMUSCULAR | Status: AC
Start: 2020-06-13 — End: 2020-06-13
  Administered 2020-06-13: 20:00:00 via INTRAVENOUS

## 2020-06-13 MED ORDER — GLYCOPYRROLATE (PF) 0.2 MG/ML INJECTION SOLUTION
0.2 mg/mL | INTRAMUSCULAR | Status: AC
Start: 2020-06-13 — End: ?

## 2020-06-13 MED ORDER — OXYCODONE 5 MG TAB
5 mg | ORAL | Status: DC | PRN
Start: 2020-06-13 — End: 2020-06-14
  Administered 2020-06-14: 15:00:00 via ORAL

## 2020-06-13 MED ORDER — SODIUM CHLORIDE 0.9 % IV
INTRAVENOUS | Status: AC | PRN
Start: 2020-06-13 — End: 2020-06-13
  Administered 2020-06-13: 15:00:00

## 2020-06-13 MED ORDER — LACTATED RINGERS IV
INTRAVENOUS | Status: DC
Start: 2020-06-13 — End: 2020-06-13
  Administered 2020-06-13: 18:00:00 via INTRAVENOUS

## 2020-06-13 MED ORDER — PROPOFOL 10 MG/ML IV EMUL
10 mg/mL | INTRAVENOUS | Status: DC | PRN
Start: 2020-06-13 — End: 2020-06-13
  Administered 2020-06-13 (×2): via INTRAVENOUS

## 2020-06-13 MED ORDER — FAMOTIDINE 20 MG TAB
20 mg | Freq: Every day | ORAL | Status: DC
Start: 2020-06-13 — End: 2020-06-14
  Administered 2020-06-14: 15:00:00 via ORAL

## 2020-06-13 MED ORDER — ASPIRIN 81 MG CHEWABLE TAB
81 mg | Freq: Every day | ORAL | Status: DC
Start: 2020-06-13 — End: 2020-06-14

## 2020-06-13 MED ORDER — CELECOXIB 200 MG CAP
200 mg | Freq: Once | ORAL | Status: AC
Start: 2020-06-13 — End: 2020-06-13
  Administered 2020-06-13: 14:00:00 via ORAL

## 2020-06-13 MED ORDER — MORPHINE 2 MG/ML INJECTION
2 mg/mL | INTRAMUSCULAR | Status: DC | PRN
Start: 2020-06-13 — End: 2020-06-13

## 2020-06-13 MED ORDER — SODIUM CHLORIDE 0.9 % IV
INTRAVENOUS | Status: AC
Start: 2020-06-13 — End: 2020-06-14
  Administered 2020-06-13 – 2020-06-14 (×2): via INTRAVENOUS

## 2020-06-13 MED ORDER — FENTANYL CITRATE (PF) 50 MCG/ML IJ SOLN
50 mcg/mL | INTRAMUSCULAR | Status: DC | PRN
Start: 2020-06-13 — End: 2020-06-13
  Administered 2020-06-13 (×2): via INTRAVENOUS

## 2020-06-13 MED ORDER — ATORVASTATIN 10 MG TAB
10 mg | Freq: Every day | ORAL | Status: DC
Start: 2020-06-13 — End: 2020-06-14
  Administered 2020-06-14: 15:00:00 via ORAL

## 2020-06-13 MED ORDER — GLYCOPYRROLATE 0.2 MG/ML IJ SOLN
0.2 mg/mL | INTRAMUSCULAR | Status: DC | PRN
Start: 2020-06-13 — End: 2020-06-13
  Administered 2020-06-13: 15:00:00 via INTRAVENOUS

## 2020-06-13 MED ORDER — OXYCODONE 5 MG TAB
5 mg | ORAL | Status: DC | PRN
Start: 2020-06-13 — End: 2020-06-14

## 2020-06-13 MED ORDER — MIDAZOLAM 1 MG/ML IJ SOLN
1 mg/mL | INTRAMUSCULAR | Status: DC | PRN
Start: 2020-06-13 — End: 2020-06-13

## 2020-06-13 MED ORDER — DEXAMETHASONE SODIUM PHOSPHATE 4 MG/ML IJ SOLN
4 mg/mL | Freq: Once | INTRAMUSCULAR | Status: DC
Start: 2020-06-13 — End: 2020-06-13

## 2020-06-13 MED ORDER — HYDROMORPHONE 2 MG/ML INJECTION SOLUTION
2 mg/mL | INTRAMUSCULAR | Status: AC
Start: 2020-06-13 — End: ?

## 2020-06-13 MED ORDER — PROPOFOL 10 MG/ML IV EMUL
10 mg/mL | INTRAVENOUS | Status: DC | PRN
Start: 2020-06-13 — End: 2020-06-13
  Administered 2020-06-13 (×2): via INTRAVENOUS

## 2020-06-13 MED ORDER — HYDROMORPHONE 1 MG/ML INJECTION SOLUTION
1 mg/mL | INTRAMUSCULAR | Status: DC | PRN
Start: 2020-06-13 — End: 2020-06-13

## 2020-06-13 MED ORDER — KETAMINE 100 MG/ML IJ SOLN
100 mg/mL | INTRAMUSCULAR | Status: AC
Start: 2020-06-13 — End: ?

## 2020-06-13 MED ORDER — LIDOCAINE (PF) 20 MG/ML (2 %) IJ SOLN
20 mg/mL (2 %) | INTRAMUSCULAR | Status: DC | PRN
Start: 2020-06-13 — End: 2020-06-13
  Administered 2020-06-13: 15:00:00 via INTRAVENOUS

## 2020-06-13 MED ORDER — LIDOCAINE (PF) 10 MG/ML (1 %) IJ SOLN
10 mg/mL (1 %) | INTRAMUSCULAR | Status: DC | PRN
Start: 2020-06-13 — End: 2020-06-13

## 2020-06-13 MED ORDER — ONDANSETRON (PF) 4 MG/2 ML INJECTION
4 mg/2 mL | INTRAMUSCULAR | Status: DC | PRN
Start: 2020-06-13 — End: 2020-06-13

## 2020-06-13 MED ORDER — ACETAMINOPHEN 325 MG TABLET
325 mg | Freq: Four times a day (QID) | ORAL | Status: DC | PRN
Start: 2020-06-13 — End: 2020-06-14

## 2020-06-13 MED ORDER — LACTATED RINGERS IV
INTRAVENOUS | Status: DC
Start: 2020-06-13 — End: 2020-06-13
  Administered 2020-06-13: 14:00:00 via INTRAVENOUS

## 2020-06-13 MED ORDER — TRANEXAMIC ACID 1,000 MG/10 ML (100 MG/ML) IV
1000 mg/10 mL (100 mg/mL) | INTRAVENOUS | Status: DC | PRN
Start: 2020-06-13 — End: 2020-06-13
  Administered 2020-06-13: 15:00:00 via INTRAVENOUS

## 2020-06-13 MED ORDER — SODIUM CHLORIDE 0.9 % IJ SYRG
Freq: Three times a day (TID) | INTRAMUSCULAR | Status: DC
Start: 2020-06-13 — End: 2020-06-14
  Administered 2020-06-13 – 2020-06-14 (×4): via INTRAVENOUS

## 2020-06-13 MED ORDER — TRANEXAMIC ACID 1,000 MG/10 ML (100 MG/ML) IV
1000 mg/10 mL (100 mg/mL) | Freq: Once | INTRAVENOUS | Status: DC
Start: 2020-06-13 — End: 2020-06-13

## 2020-06-13 MED ORDER — SODIUM CHLORIDE 0.9 % IJ SYRG
INTRAMUSCULAR | Status: DC | PRN
Start: 2020-06-13 — End: 2020-06-14

## 2020-06-13 MED FILL — TRANEXAMIC ACID 1,000 MG/10 ML (100 MG/ML) IV: 1000 mg/10 mL (100 mg/mL) | INTRAVENOUS | Qty: 10

## 2020-06-13 MED FILL — KETAMINE 100 MG/ML IJ SOLN: 100 mg/mL | INTRAMUSCULAR | Qty: 5

## 2020-06-13 MED FILL — NAROPIN (PF) 5 MG/ML (0.5 %) INJECTION SOLUTION: 5 mg/mL (0. %) | INTRAMUSCULAR | Qty: 30

## 2020-06-13 MED FILL — ALCOHOL 62% (NOZIN) NASAL SANITIZER: 62 % | CUTANEOUS | Qty: 1

## 2020-06-13 MED FILL — PROCHLORPERAZINE EDISYLATE 5 MG/ML INJECTION: 5 mg/mL | INTRAMUSCULAR | Qty: 2

## 2020-06-13 MED FILL — CEFAZOLIN 1 GRAM SOLUTION FOR INJECTION: 1 gram | INTRAMUSCULAR | Qty: 2000

## 2020-06-13 MED FILL — CELECOXIB 200 MG CAP: 200 mg | ORAL | Qty: 1

## 2020-06-13 MED FILL — SODIUM CHLORIDE 0.9 % IV: INTRAVENOUS | Qty: 1000

## 2020-06-13 MED FILL — ACETAMINOPHEN 500 MG TAB: 500 mg | ORAL | Qty: 2

## 2020-06-13 MED FILL — NAROPIN (PF) 5 MG/ML (0.5 %) INJECTION SOLUTION: 5 mg/mL (0. %) | INTRAMUSCULAR | Qty: 60

## 2020-06-13 MED FILL — MAPAP (ACETAMINOPHEN) 325 MG TABLET: 325 mg | ORAL | Qty: 2

## 2020-06-13 MED FILL — BD POSIFLUSH NORMAL SALINE 0.9 % INJECTION SYRINGE: INTRAMUSCULAR | Qty: 40

## 2020-06-13 MED FILL — ASPIRIN 325 MG TAB, DELAYED RELEASE: 325 mg | ORAL | Qty: 1

## 2020-06-13 MED FILL — DEXAMETHASONE SODIUM PHOSPHATE 4 MG/ML IJ SOLN: 4 mg/mL | INTRAMUSCULAR | Qty: 3

## 2020-06-13 MED FILL — ALCOHOL 62% (NOZIN) NASAL SANITIZER: 62 % | CUTANEOUS | Qty: 3

## 2020-06-13 MED FILL — LACTATED RINGERS IV: INTRAVENOUS | Qty: 1000

## 2020-06-13 MED FILL — HYDROMORPHONE 2 MG/ML INJECTION SOLUTION: 2 mg/mL | INTRAMUSCULAR | Qty: 1

## 2020-06-13 MED FILL — PROPOFOL 10 MG/ML IV EMUL: 10 mg/mL | INTRAVENOUS | Qty: 100

## 2020-06-13 MED FILL — DOK PLUS 8.6 MG-50 MG TABLET: ORAL | Qty: 1

## 2020-06-13 MED FILL — ONDANSETRON (PF) 4 MG/2 ML INJECTION: 4 mg/2 mL | INTRAMUSCULAR | Qty: 2

## 2020-06-13 MED FILL — SODIUM CHLORIDE 0.9 % IV: INTRAVENOUS | Qty: 500

## 2020-06-13 MED FILL — ONDANSETRON 4 MG TAB, RAPID DISSOLVE: 4 mg | ORAL | Qty: 1

## 2020-06-13 MED FILL — GLYRX-PF 0.2 MG/ML INJECTION SOLUTION: 0.2 mg/mL | INTRAMUSCULAR | Qty: 1

## 2020-06-13 MED FILL — PROPOFOL 10 MG/ML IV EMUL: 10 mg/mL | INTRAVENOUS | Qty: 20

## 2020-06-13 MED FILL — LIDOCAINE (PF) 20 MG/ML (2 %) IJ SOLN: 20 mg/mL (2 %) | INTRAMUSCULAR | Qty: 5

## 2020-06-13 MED FILL — FENTANYL CITRATE (PF) 50 MCG/ML IJ SOLN: 50 mcg/mL | INTRAMUSCULAR | Qty: 2

## 2020-06-13 NOTE — Interval H&P Note (Signed)
 Handoff Report from Operating Room to PACU  11:54 AM  Report received from R Sheena RN and Vanice CRNA regarding [employer]      Surgeon(s):  Budny, Jacob M, DO  And Procedure(s) (LRB):  LEFT TOTAL KNEE ARTHROPLASTY (Left)  confirmed   with allergies and dressings discussed.    Anesthesia type, drugs, patient history, complications, estimated blood loss, vital signs, intake and output, and last pain medication, lines and temperature were reviewed.    12:30 PM  X Ray at bedside    12:56 PM  TRANSFER - OUT REPORT:    Verbal report given to [facility] RN(name) on Wendy Ali  being transferred to [facility] (unit) for routine progression of care       Report consisted of patient's Situation, Background, Assessment and   Recommendations(SBAR).     Information from the following report(s) SBAR, Kardex, MAR and Accordion was reviewed with the receiving nurse.    Lines:   Peripheral IV 06/13/20 Anterior; Proximal; Right Forearm (Active)   Site Assessment Clean, dry, & intact 06/13/20 1245   Phlebitis Assessment 0 06/13/20 1245   Infiltration Assessment 0 06/13/20 1245   Dressing Status Clean, dry, & intact 06/13/20 1245   Dressing Type Transparent; Tape 06/13/20 1245   Hub Color/Line Status Pink; Infusing 06/13/20 1245        Opportunity for questions and clarification was provided.  Husband updated on bed assignment.    Patient transported with:   O2 @ 2 liters  Tech

## 2020-06-13 NOTE — Anesthesia Pre-Procedure Evaluation (Signed)
Relevant Problems   No relevant active problems       Anesthetic History   No history of anesthetic complications            Review of Systems / Medical History  Patient summary reviewed, nursing notes reviewed and pertinent labs reviewed    Pulmonary  Within defined limits      Sleep apnea           Neuro/Psych   Within defined limits           Cardiovascular  Within defined limits  Hypertension          Hyperlipidemia    Exercise tolerance: >4 METS  Comments: LV: Estimated LVEF is 60 - 65%. Normal cavity size and systolic function (ejection fraction normal). Mild concentric hypertrophy. Wall motion: normal. Mild (grade 1) left ventricular diastolic dysfunction.   GI/Hepatic/Renal  Within defined limits              Endo/Other  Within defined limits  Diabetes    Arthritis     Other Findings              Physical Exam    Airway  Mallampati: II  TM Distance: > 6 cm  Neck ROM: normal range of motion   Mouth opening: Normal     Cardiovascular  Regular rate and rhythm,  S1 and S2 normal,  no murmur, click, rub, or gallop             Dental  No notable dental hx       Pulmonary  Breath sounds clear to auscultation               Abdominal  GI exam deferred       Other Findings            Anesthetic Plan    ASA: 3  Anesthesia type: general      Post-op pain plan if not by surgeon: peripheral nerve block single    Induction: Intravenous  Anesthetic plan and risks discussed with: Patient

## 2020-06-13 NOTE — Interval H&P Note (Signed)
Negative covid test on 06/08/2020. deneis S/S, + vaccinated.    belongings to PACU, 1 bag.   Husband via cell.   Remains alert and oriented. Bed in low position, call bell in reach, side rails up x 2

## 2020-06-13 NOTE — Anesthesia Post-Procedure Evaluation (Signed)
Procedure(s):  LEFT TOTAL KNEE ARTHROPLASTY.    general    Anesthesia Post Evaluation        Patient location during evaluation: PACU  Note status: Adequate.  Level of consciousness: responsive to verbal stimuli and sleepy but conscious  Pain management: satisfactory to patient  Airway patency: patent  Anesthetic complications: no  Cardiovascular status: acceptable  Respiratory status: acceptable  Hydration status: acceptable  Comments: +Post-Anesthesia Evaluation and Assessment    Patient: Wendy Ali MRN: [id number]  SSN: [id number]   Date of Birth: [birth year]  Age: 79 y.o.  Sex: female          Cardiovascular Function/Vital Signs    BP (!) 131/58    Pulse 69    Temp 36.4 ??C (97.5 ??F)    Resp 14    Ht 5\' 4"  (1.626 m)    Wt 82.7 kg (182 lb 5.1 oz)    SpO2 100%    BMI 31.30 kg/m??     Patient is status post Procedure(s):  LEFT TOTAL KNEE ARTHROPLASTY.    Nausea/Vomiting: Controlled.    Postoperative hydration reviewed and adequate.    Pain:  Pain Scale 1: Numeric (0 - 10) (06/13/20 1245)  Pain Intensity 1: 0 (06/13/20 1245)   Managed.    Neurological Status:   Neuro (WDL): Within Defined Limits (06/13/20 1245)   At baseline.    Mental Status and Level of Consciousness: Arousable.    Pulmonary Status:   O2 Device: Nasal cannula (06/13/20 1245)   Adequate oxygenation and airway patent.    Complications related to anesthesia: None    Post-anesthesia assessment completed. No concerns.    I have evaluated the patient and the patient is stable and ready to be discharged from PACU .    Signed By: Irene Pap, MD    06/13/2020        INITIAL Post-op Vital signs:   Vitals Value Taken Time   BP 131/58 06/13/20 1245   Temp 36.4 ??C (97.5 ??F) 06/13/20 1245   Pulse 56 06/13/20 1256   Resp 12 06/13/20 1256   SpO2 98 % 06/13/20 1256   Vitals shown include unvalidated device data.

## 2020-06-13 NOTE — Progress Notes (Signed)
Formatting of this note is different from the original.  Ortho / Neurosurgery NP Note    POD# 0  s/p LEFT TOTAL KNEE ARTHROPLASTY   Pt seen with caregiver at bedside.     Pt resting in bed.   Drowsy, eyes open to voice but quickly nods off.   Denies post-op pain.   Complaints of nausea with dry-heaves, RN to medicate with zofran.     VSS Afebrile. 2L NC.     Visit Vitals  BP (!) 140/71   Pulse 66   Temp 97.8 F (36.6 C)   Resp 14   Ht 5\' 4"  (1.626 m)   Wt 82.7 kg (182 lb 5.1 oz)   SpO2 100%   BMI 31.30 kg/m     Voiding status: due to void       Labs    Lab Results   Component Value Date/Time    HGB 11.7 05/20/2020 09:31 AM     Lab Results   Component Value Date/Time    INR 1.0 05/20/2020 09:31 AM     Lab Results   Component Value Date/Time    Sodium 136 05/20/2020 09:31 AM    Potassium 3.8 05/20/2020 09:31 AM    Chloride 106 05/20/2020 09:31 AM    CO2 28 05/20/2020 09:31 AM    Glucose 118 (H) 05/20/2020 09:31 AM    BUN 19 05/20/2020 09:31 AM    Creatinine 1.01 05/20/2020 09:31 AM    Calcium 10.1 05/20/2020 09:31 AM     Recent Glucose Results:   Lab Results   Component Value Date/Time    GLUCPOC 103 06/13/2020 12:14 PM    GLUCPOC 138 (H) 06/13/2020 09:12 AM     Body mass index is 31.3 kg/m. : A BMI > 30 is classified as obesity and > 40 is classified as morbid obesity.     Dressing c.d.i  Cryotherapy in place over incision  Calves soft and supple; No pain with passive stretch  Sensation and motor intact - PF/DF/EHL intact. +2 pedal pulse   SCDs for mechanical DVT proph while in bed    PLAN:  1) PT BID - WBAT  2) Aspirin 325 mg PO BID for DVT Prophylaxis   3) GI Prophylaxis - Pepcid  4) Nausea - prn zofran. Continue IV fluids for hydration.   5) Readniess for discharge:     [x]  Vital Signs stable    []  Hgb stable    []  + Voiding    [x]  Wound intact, drainage minimal    []  Tolerating PO intake     []  Cleared by PT (OT if applicable)     []  Stair training completed (if applicable)    []  Independent / Environmental education officer (household distance)     []  Bed mobility     []  Car transfers     []  ADL?s    []  Adequate pain control on oral medication alone     Allow time from anesthesia - hopefully can participate with therapy later today.     Adriana Reams, NP      Electronically signed by Adriana Reams, NP at 06/13/2020  2:02 PM EST

## 2020-06-13 NOTE — Progress Notes (Signed)
Ortho / Neurosurgery NP Note    POD# 0  s/p LEFT TOTAL KNEE ARTHROPLASTY   Pt seen with caregiver at bedside.     Pt resting in bed.   Drowsy, eyes open to voice but quickly nods off.   Denies post-op pain.   Complaints of nausea with dry-heaves, RN to medicate with zofran.       VSS Afebrile. 2L NC.     Visit Vitals  BP (!) 140/71   Pulse 66   Temp 97.8 ??F (36.6 ??C)   Resp 14   Ht 5\' 4"  (1.626 m)   Wt 82.7 kg (182 lb 5.1 oz)   SpO2 100%   BMI 31.30 kg/m??       Voiding status: due to void          Labs    Lab Results   Component Value Date/Time    HGB 11.7 05/20/2020 09:31 AM      Lab Results   Component Value Date/Time    INR 1.0 05/20/2020 09:31 AM      Lab Results   Component Value Date/Time    Sodium 136 05/20/2020 09:31 AM    Potassium 3.8 05/20/2020 09:31 AM    Chloride 106 05/20/2020 09:31 AM    CO2 28 05/20/2020 09:31 AM    Glucose 118 (H) 05/20/2020 09:31 AM    BUN 19 05/20/2020 09:31 AM    Creatinine 1.01 05/20/2020 09:31 AM    Calcium 10.1 05/20/2020 09:31 AM     Recent Glucose Results:   Lab Results   Component Value Date/Time    GLUCPOC 103 06/13/2020 12:14 PM    GLUCPOC 138 (H) 06/13/2020 09:12 AM           Body mass index is 31.3 kg/m??. : A BMI > 30 is classified as obesity and > 40 is classified as morbid obesity.       Dressing c.d.i  Cryotherapy in place over incision  Calves soft and supple; No pain with passive stretch  Sensation and motor intact - PF/DF/EHL intact. +2 pedal pulse   SCDs for mechanical DVT proph while in bed     PLAN:  1) PT BID - WBAT  2) Aspirin 325 mg PO BID for DVT Prophylaxis   3) GI Prophylaxis - Pepcid  4) Nausea - prn zofran. Continue IV fluids for hydration.   5) Readniess for discharge:     [x]  Vital Signs stable    []  Hgb stable    []  + Voiding    [x]  Wound intact, drainage minimal    []  Tolerating PO intake     []  Cleared by PT (OT if applicable)     []  Stair training completed (if applicable)    []  Independent / Contact Guard Assist (household distance)     []  Bed  mobility     []  Car transfers     []  ADL???s    []  Adequate pain control on oral medication alone     Allow time from anesthesia - hopefully can participate with therapy later today.     Adriana Reams, NP

## 2020-06-13 NOTE — Anesthesia Procedure Notes (Signed)
Peripheral Block    Start time: 06/13/2020 9:42 AM  End time: 06/13/2020 9:52 AM  Performed by: Irene Pap, MD  Authorized by: Irene Pap, MD       Pre-procedure:   Indications: at surgeon's request and post-op pain management    Preanesthetic Checklist: patient identified, risks and benefits discussed, site marked, timeout performed, anesthesia consent given and patient being monitored      Block Type:   Block Type:  Adductor canal  Laterality:  Left  Monitoring:  Standard ASA monitoring, responsive to questions, oxygen, continuous pulse ox, frequent vital sign checks and heart rate  Injection Technique:  Single shot  Procedures: ultrasound guided    Patient Position: supine  Prep: chlorhexidine    Location:  Mid thigh  Needle Type:  Stimuplex  Needle Gauge:  22 G  Needle Localization:  Ultrasound guidance  Medication Injected:  Ropivacaine (PF) (NAROPIN)(0.5%) 5 mg/mL injection, 30 mL    Assessment:  Number of attempts:  1  Injection Assessment:  Incremental injection every 5 mL, local visualized surrounding nerve on ultrasound, negative aspiration for blood, no intravascular symptoms, no paresthesia and ultrasound image on chart  Patient tolerance:  Patient tolerated the procedure well with no immediate complications

## 2020-06-13 NOTE — Progress Notes (Signed)
Famotidine dose reduced per Renal Dosing Protocol    Est Crcl ~ 68ml/min    20mg  BID changed to 20mg  Daily for Crcl < 50 ml/min

## 2020-06-13 NOTE — Unmapped (Signed)
Formatting of this note is different from the original.  Handoff Report from Operating Room to PACU  11:54 AM  Report received from [place] and Tia Masker CRNA regarding [name]      Surgeon(s):  Karenann Cai M, DO  And Procedure(s) (LRB):  LEFT TOTAL KNEE ARTHROPLASTY (Left)  confirmed   with allergies and dressings discussed.    Anesthesia type, drugs, patient history, complications, estimated blood loss, vital signs, intake and output, and last pain medication, lines and temperature were reviewed.    12:30 PM  X Ray at bedside    12:56 PM  TRANSFER - OUT REPORT:    Verbal report given to [facility] RN(name) on Wendy Ali  being transferred to [facility] (unit) for routine progression of care       Report consisted of patient?s Situation, Background, Assessment and   Recommendations(SBAR).     Information from the following report(s) SBAR, Kardex, MAR and Accordion was reviewed with the receiving nurse.    Lines:   Peripheral IV 06/13/20 Anterior; Proximal; Right Forearm (Active)   Site Assessment Clean, dry, & intact 06/13/20 1245   Phlebitis Assessment 0 06/13/20 1245   Infiltration Assessment 0 06/13/20 1245   Dressing Status Clean, dry, & intact 06/13/20 1245   Dressing Type Transparent; Tape 06/13/20 1245   Hub Color/Line Status Pink; Infusing 06/13/20 1245       Opportunity for questions and clarification was provided.  Husband updated on bed assignment.    Patient transported with:   O2 @ 2 liters  Tech      Electronically signed by Margaretha Glassing, RN at 06/13/2020 12:57 PM EST

## 2020-06-13 NOTE — Unmapped (Signed)
Formatting of this note might be different from the original.  Date of Procedure: 06/13/2020  PREOPERATIVE DIAGNOSIS: Left knee osteoarthrosis.   POSTOPERATIVE DIAGNOSIS: Left knee osteoarthrosis.    OPERATION: Left total knee arthroplasty.    ASSISTANT SURGEON: none    ANESTHESIA: general with adductor canal block  COMPLICATIONS: None.    SPECIMENS: None.  DRAINS: None.    ESTIMATED BLOOD LOSS: 100 mL.  IMPLANTS:     [employer]ker Triathlon PS femoral component size #3  Triathlon tibial bearing insert PS size #3 with a 14 mm thickness   Triathlon total knee universal tibial baseplate size #3 Triathlon symmetric patellar size S 27 mm diameter with 8 mm thickness.   Preoperative range of motion: 5 degrees extension to 95 degrees flexion with 10 degree valgus deformity  Postoperative range of motion: 0 degrees extension to 120 degrees flexion, no deformity    OPERATIVE INDICATIONS: This is a 79 y.o.female who failed nonoperative management of arthrosis of the left knee. They did elect for a total knee arthroplasty. We did discuss the risks of surgery which include but are not limited to infection, nerve or blood vessel damage, need for reoperation, death, disability, DVT, PE, postoperative pain, swelling and stiffness, loss of range of motion, implant failure, knee instability, need for reoperation, wound healing complications and all other organ dysfunctions. The patient did freely consent for surgery.      DESCRIPTION OF PROCEDURE IN DETAIL: After the correct site and side were identified by myself in the holding area, the patient was transported to a surgical suite and successful induction of anesthesia was performed. The patient was then transported to a surgical table where all bony prominences were padded and the left leg was prepped and draped in the usual sterile orthopedic fashion. After an appropriate time-out and confirmation that antibiotics had been infused prior to any incision, the leg was exsanguinated and  tourniquet taken to 250 mmHg. With that, the knee was taken into flexion and a midline incision was made over the patella, tracking to the medial aspect of the tibial tubercle. Dissection was carried down to the capsule and a median parapatellar arthrotomy was performed. The knee was taken to extension and the medial deep MCL peel was performed to the midcoronal plane. The patella was everted and the knee was taken into flexion and the patella fat pad was removed to the extent necessary to visualize the knee. The ACL and PCL were then resected. Notch osteophytes were removed with use of an osteotome. Osteophytes were then removed from about the distal femur. With that ,Whiteside's line as well as the transepicondylar axis were identified and marked on the knee. Just medial to the [place] line at the level of the femoral shaft, a canal entry reamer was then used to gain entry into the canal. This was then irrigated and aspirated. The intramedullary guide was then placed into the knee. A jig was then placed onto the knee and the jig was set at 8 mm of resection and 5 degrees of valgus. This was pinned into place and then a distal femoral resection was performed. With that, retractors were removed and attention was turned to the proximal tibia were the Ran-Sall maneuver was performed and retractors were placed about the proximal tibia. An entry drill was used in the central tibia to enter the diaphysis.  This was evacuated of marrow.  The intramedullary guide was then placed in 0 degrees of varus and valgus as well as 0 degrees of  slope. We did plan to resect 2 mm from the diseased side. This was then pinned into place and the resection was performed with the use of an oscillating blade. A trial of the gap balancer was performed and this was found to be adequate at 9 mm of resection. The pins were then removed. The  knee was taken into flexion again and the femoral sizer was used. Then the retractors were again  placed about the distal femur. The posterior condyles were resected and the gap balancer was again used and 9 mm was found to be a very good balance. The remaining chamfer cuts were then performed. The box cutting jig was then pinned into place. Care was taken to ensure that there was no lateral overhang. The box was then cut with an oscillating blade as well as a chisel through the chisel slot. The box cutting jig was then removed and the posterior osteophytes were removed with the chisel. The knee was then taken into extension and the meniscal remnants were then removed under tension provided by a lamina spreader. Then 0.5% ropivacaine was then infused throughout the posterior capsule. Care was taken to avoid any intravascular injection. Trials were then placed for a size 9 mm insert. These were found to have excellent coverage and sizing. Stability was excellent throughout the range of motion and range of motion was from 0-130 degrees of flexion. There was no coronal plane instability in any degree of flexion. The rotation of the tibia was then marked. The patella was everted and with the resection guide, I did resect the cartilaginous surface to the appropriate depth for patella implant. This did have an even cut surface at the end. I confirmed this with calipers. I then chose the appropriate size patellar button size and drilled the lugholes. I trialed the patellar button and this did track centrally through all range of motion of flexion. There was a no thumb sign. There was no coronal plane instability. Therefore, the trials were removed and the proximal tibia was exposed and retractors were placed about the proximal tibia where the tibial baseplate was pinned into place. Then, the drill and punch were used. This was all removed and the knee was copiously irrigated with normal saline mixed with Betadine through a pulsed lavage. The knee was then taken into flexion and dried thoroughly. With that, we began  cementing. I pre-coated the tibial component with cement and used a cement gun to infuse cement into the prepared proximal tibia. I did use digital pressurization for this. I then impacted into place the tibia with primary and then secondary impactor and all excess cement was removed. Care was taken to respect of rotation. The same process was performed on the femur and all excess bone was removed as well. A trial insert was then placed and the knee was taken into extension and pre-coated patellar component was cemented into place on a digitally pressurized patellar surface. Excess bone cement was removed and the clamp was secured. The knee was left in full extension and allow to cure entirely prior to any movement. Once I was satisfied with the curing process, I again ranged the knee and was very satisfied and therefore I removed the trial and impacted into place a final polyethylene spacer. I confirmed that the locking mechanism had engaged, I then irrigated the knee thoroughly. The tourniquet was taken down and all bleeders were coagulated. With that, the capsule was enclosed with #1 Vicryl suture and a Stratafix. The deep  fat stitches were placed, 2-0 Vicryl, 3-0 Monocryl, Dermabond and Steri-Strips were applied. The patient was then taken to PACU in stable condition with no obvious intraoperative complications.     POSTOPERATIVE: The patient will be weightbearing as tolerated. They will have ecotrin for DVT prophylaxis, Percocet for pain management and Colace for bowel maintenance. She will be seen at 2 weeks and then 6 weeks from our standard total joint protocol.    Electronically signed by Aaron Mose, DO at 06/13/2020 12:11 PM EST

## 2020-06-13 NOTE — Progress Notes (Signed)
Formatting of this note might be different from the original.  PT order acknowledged. Started TKR exercises in bed at which times she became nauseated and vomited into emesis bag. Assisted patient in managing it, then repositioned, placed SCDs on legs and applied fresh ice pack. RN had given Zofran prior to PT starting. Notified of situation during the attempted session. Will follow up tomorrow for eval.    Heide Guile, PT    Electronically signed by Heide Guile, PT at 06/13/2020  3:00 PM EST

## 2020-06-13 NOTE — Progress Notes (Signed)
Formatting of this note might be different from the original.  Famotidine dose reduced per Renal Dosing Protocol    Est Crcl ~ 64ml/min    20mg  BID changed to 20mg  Daily for Crcl < 50 ml/min  Electronically signed by Joelyn Oms, [place] at 06/13/2020  1:56 PM EST

## 2020-06-13 NOTE — Op Note (Signed)
Date of Procedure: 06/13/2020  PREOPERATIVE DIAGNOSIS: Left knee osteoarthrosis.   POSTOPERATIVE DIAGNOSIS: Left knee osteoarthrosis.    OPERATION: Left total knee arthroplasty.    ASSISTANT SURGEON: none    ANESTHESIA: general with adductor canal block  COMPLICATIONS: None.    SPECIMENS: None.  DRAINS: None.    ESTIMATED BLOOD LOSS: 100 mL.  IMPLANTS:     [employer] Triathlon PS femoral component size #3  Triathlon tibial bearing insert PS size #3 with a 14 mm thickness   Triathlon total knee universal tibial baseplate size #3 Triathlon symmetric patellar size S 27 mm diameter with 8 mm thickness.   Preoperative range of motion: 5 degrees extension to 95 degrees flexion with 10 degree valgus deformity  Postoperative range of motion: 0 degrees extension to 120 degrees flexion, no deformity     OPERATIVE INDICATIONS: This is a 79 y.o.female who failed nonoperative management of arthrosis of the left knee. They did elect for a total knee arthroplasty. We did discuss the risks of surgery which include but are not limited to infection, nerve or blood vessel damage, need for reoperation, death, disability, DVT, PE, postoperative pain, swelling and stiffness, loss of range of motion, implant failure, knee instability, need for reoperation, wound healing complications and all other organ dysfunctions. The patient did freely consent for surgery.       DESCRIPTION OF PROCEDURE IN DETAIL: After the correct site and side were identified by myself in the holding area, the patient was transported to a surgical suite and successful induction of anesthesia was performed. The patient was then transported to a surgical table where all bony prominences were padded and the left leg was prepped and draped in the usual sterile orthopedic fashion. After an appropriate time-out and confirmation that antibiotics had been infused prior to any incision, the leg was exsanguinated and tourniquet taken to 250 mmHg. With that, the knee was taken  into flexion and a midline incision was made over the patella, tracking to the medial aspect of the tibial tubercle. Dissection was carried down to the capsule and a median parapatellar arthrotomy was performed. The knee was taken to extension and the medial deep MCL peel was performed to the midcoronal plane. The patella was everted and the knee was taken into flexion and the patella fat pad was removed to the extent necessary to visualize the knee. The ACL and PCL were then resected. Notch osteophytes were removed with use of an osteotome. Osteophytes were then removed from about the distal femur. With that ,Whiteside's line as well as the transepicondylar axis were identified and marked on the knee. Just medial to the Sam Rayburn line at the level of the femoral shaft, a canal entry reamer was then used to gain entry into the canal. This was then irrigated and aspirated. The intramedullary guide was then placed into the knee. A jig was then placed onto the knee and the jig was set at 8 mm of resection and 5 degrees of valgus. This was pinned into place and then a distal femoral resection was performed. With that, retractors were removed and attention was turned to the proximal tibia were the Ran-Sall maneuver was performed and retractors were placed about the proximal tibia. An entry drill was used in the central tibia to enter the diaphysis.  This was evacuated of marrow.  The intramedullary guide was then placed in 0 degrees of varus and valgus as well as 0 degrees of slope. We did plan to resect 2 mm from  the diseased side. This was then pinned into place and the resection was performed with the use of an oscillating blade. A trial of the gap balancer was performed and this was found to be adequate at 9 mm of resection. The pins were then removed. The  knee was taken into flexion again and the femoral sizer was used. Then the retractors were again placed about the distal femur. The posterior condyles were  resected and the gap balancer was again used and 9 mm was found to be a very good balance. The remaining chamfer cuts were then performed. The box cutting jig was then pinned into place. Care was taken to ensure that there was no lateral overhang. The box was then cut with an oscillating blade as well as a chisel through the chisel slot. The box cutting jig was then removed and the posterior osteophytes were removed with the chisel. The knee was then taken into extension and the meniscal remnants were then removed under tension provided by a lamina spreader. Then 0.5% ropivacaine was then infused throughout the posterior capsule. Care was taken to avoid any intravascular injection. Trials were then placed for a size 9 mm insert. These were found to have excellent coverage and sizing. Stability was excellent throughout the range of motion and range of motion was from 0-130 degrees of flexion. There was no coronal plane instability in any degree of flexion. The rotation of the tibia was then marked. The patella was everted and with the resection guide, I did resect the cartilaginous surface to the appropriate depth for patella implant. This did have an even cut surface at the end. I confirmed this with calipers. I then chose the appropriate size patellar button size and drilled the lugholes. I trialed the patellar button and this did track centrally through all range of motion of flexion. There was a no thumb sign. There was no coronal plane instability. Therefore, the trials were removed and the proximal tibia was exposed and retractors were placed about the proximal tibia where the tibial baseplate was pinned into place. Then, the drill and punch were used. This was all removed and the knee was copiously irrigated with normal saline mixed with Betadine through a pulsed lavage. The knee was then taken into flexion and dried thoroughly. With that, we began cementing. I pre-coated the tibial component with cement and  used a cement gun to infuse cement into the prepared proximal tibia. I did use digital pressurization for this. I then impacted into place the tibia with primary and then secondary impactor and all excess cement was removed. Care was taken to respect of rotation. The same process was performed on the femur and all excess bone was removed as well. A trial insert was then placed and the knee was taken into extension and pre-coated patellar component was cemented into place on a digitally pressurized patellar surface. Excess bone cement was removed and the clamp was secured. The knee was left in full extension and allow to cure entirely prior to any movement. Once I was satisfied with the curing process, I again ranged the knee and was very satisfied and therefore I removed the trial and impacted into place a final polyethylene spacer. I confirmed that the locking mechanism had engaged, I then irrigated the knee thoroughly. The tourniquet was taken down and all bleeders were coagulated. With that, the capsule was enclosed with #1 Vicryl suture and a Stratafix. The deep fat stitches were placed, 2-0 Vicryl, 3-0 Monocryl, Dermabond  and Steri-Strips were applied. The patient was then taken to PACU in stable condition with no obvious intraoperative complications.      POSTOPERATIVE: The patient will be weightbearing as tolerated. They will have ecotrin for DVT prophylaxis, Percocet for pain management and Colace for bowel maintenance. She will be seen at 2 weeks and then 6 weeks from our standard total joint protocol.

## 2020-06-13 NOTE — Unmapped (Signed)
Formatting of this note might be different from the original.  Irrisept Wound Debridement and Cleansing System  Ref: [facility] [id number] LOT: [id number] Expiration Date: 03/15/2022    Electronically signed by Huel Cote at 06/13/2020 10:26 AM EST

## 2020-06-13 NOTE — Progress Notes (Signed)
PT order acknowledged. Started TKR exercises in bed at which times she became nauseated and vomited into emesis bag. Assisted patient in managing it, then repositioned, placed SCDs on legs and applied fresh ice pack. RN had given Zofran prior to PT starting. Notified of situation during the attempted session. Will follow up tomorrow for eval.    Heide Guile, PT

## 2020-06-13 NOTE — Interval H&P Note (Signed)
Irrisept Wound Debridement and Cleansing System  Ref: [facility]: [id number] LOT: [id number] Expiration Date: 03/15/2022

## 2020-06-13 NOTE — Unmapped (Signed)
Formatting of this note might be different from the original.  Negative covid test on 06/08/2020. deneis S/S, + vaccinated.    belongings to PACU, 1 bag.   Husband via cell.   Remains alert and oriented. Bed in low position, call bell in reach, side rails up x 2    Electronically signed by Audry Pili, RN at 06/13/2020  9:35 AM EST

## 2020-06-14 LAB — METABOLIC PANEL, BASIC
Anion gap: 7 mmol/L (ref 5–15)
BUN/Creatinine ratio: 19 (ref 12–20)
BUN: 17 MG/DL (ref 6–20)
CO2: 25 mmol/L (ref 21–32)
Calcium: 8.9 MG/DL (ref 8.5–10.1)
Chloride: 107 mmol/L (ref 97–108)
Creatinine: 0.88 MG/DL (ref 0.55–1.02)
GFR est AA: >60 mL/min/{1.73_m2} (ref >60)
GFR est non-AA: >60 mL/min/{1.73_m2} (ref >60)
Glucose: 114 mg/dL — ABNORMAL HIGH (ref 65–100)
Potassium: 3.8 mmol/L (ref 3.5–5.1)
Sodium: 139 mmol/L (ref 136–145)

## 2020-06-14 LAB — GLUCOSE, POC
Glucose (POC): 149 mg/dL — ABNORMAL HIGH (ref 65–117)
Glucose (POC): 102 mg/dL (ref 65–117)
Glucose (POC): 167 mg/dL — ABNORMAL HIGH (ref 65–117)

## 2020-06-14 LAB — HEMOGLOBIN
HGB: 9.3 g/dL — ABNORMAL LOW (ref 11.5–16.0)
Hemoglobin: 9.3 g/dL — ABNORMAL LOW (ref 11.5–16.0)

## 2020-06-14 LAB — BASIC METABOLIC PANEL
Anion Gap: 7 mmol/L (ref 5–15)
BUN: 17 MG/DL (ref 6–20)
Bun/Cre Ratio: 19 (ref 12–20)
CO2: 25 mmol/L (ref 21–32)
Calcium: 8.9 MG/DL (ref 8.5–10.1)
Chloride: 107 mmol/L (ref 97–108)
Creatinine: 0.88 MG/DL (ref 0.55–1.02)
EGFR IF NonAfrican American: >60 mL/min/{1.73_m2} (ref >60)
GFR African American: >60 mL/min/{1.73_m2} (ref >60)
Glucose: 114 mg/dL — ABNORMAL HIGH (ref 65–100)
Potassium: 3.8 mmol/L (ref 3.5–5.1)
Sodium: 139 mmol/L (ref 136–145)

## 2020-06-14 LAB — POCT GLUCOSE
POC Glucose: 149 mg/dL — ABNORMAL HIGH (ref 65–117)
POC Glucose: 102 mg/dL (ref 65–117)
POC Glucose: 167 mg/dL — ABNORMAL HIGH (ref 65–117)

## 2020-06-14 MED ORDER — ASPIRIN 325 MG TAB, DELAYED RELEASE
325 mg | ORAL_TABLET | Freq: Two times a day (BID) | ORAL | 0 refills | Status: AC
Start: 2020-06-14 — End: 2020-07-14

## 2020-06-14 MED ORDER — TRAMADOL 50 MG TAB
50 mg | Freq: Four times a day (QID) | ORAL | Status: DC | PRN
Start: 2020-06-14 — End: 2020-06-14

## 2020-06-14 MED ORDER — ONDANSETRON 4 MG TAB, RAPID DISSOLVE
4 mg | ORAL_TABLET | Freq: Four times a day (QID) | ORAL | 0 refills | Status: AC | PRN
Start: 2020-06-14 — End: 2020-06-21

## 2020-06-14 MED ORDER — FAMOTIDINE 20 MG TAB
20 mg | ORAL_TABLET | Freq: Every day | ORAL | 0 refills | Status: AC
Start: 2020-06-14 — End: 2020-07-15

## 2020-06-14 MED ORDER — SENNOSIDES-DOCUSATE SODIUM 8.6 MG-50 MG TAB
ORAL_TABLET | Freq: Two times a day (BID) | ORAL | 0 refills | Status: AC
Start: 2020-06-14 — End: 2020-06-21

## 2020-06-14 MED ORDER — ACETAMINOPHEN 500 MG TAB
500 mg | ORAL_TABLET | Freq: Four times a day (QID) | ORAL | 0 refills | Status: AC
Start: 2020-06-14 — End: 2020-06-21

## 2020-06-14 MED ORDER — TRAMADOL 50 MG TAB
50 mg | ORAL_TABLET | Freq: Four times a day (QID) | ORAL | 0 refills | Status: AC | PRN
Start: 2020-06-14 — End: 2020-06-22

## 2020-06-14 MED FILL — VITAMIN B-12 500 MCG TABLET: 500 mcg | ORAL | Qty: 2

## 2020-06-14 MED FILL — ACETAMINOPHEN 500 MG TAB: 500 mg | ORAL | Qty: 2

## 2020-06-14 MED FILL — FAMOTIDINE 20 MG TAB: 20 mg | ORAL | Qty: 1

## 2020-06-14 MED FILL — VITAMIN D3 25 MCG (1,000 UNIT) TABLET: 25 mcg (1,000 unit) | ORAL | Qty: 5

## 2020-06-14 MED FILL — CEFAZOLIN 1 GRAM SOLUTION FOR INJECTION: 1 gram | INTRAMUSCULAR | Qty: 2000

## 2020-06-14 MED FILL — VITAMIN C 500 MG TABLET: 500 mg | ORAL | Qty: 1

## 2020-06-14 MED FILL — LIPITOR 10 MG TABLET: 10 mg | ORAL | Qty: 1

## 2020-06-14 MED FILL — ONDANSETRON 4 MG TAB, RAPID DISSOLVE: 4 mg | ORAL | Qty: 1

## 2020-06-14 MED FILL — HEALTHYLAX 17 GRAM ORAL POWDER PACKET: 17 gram | ORAL | Qty: 1

## 2020-06-14 MED FILL — OXYCODONE 5 MG TAB: 5 mg | ORAL | Qty: 2

## 2020-06-14 MED FILL — ASPIRIN 325 MG TAB, DELAYED RELEASE: 325 mg | ORAL | Qty: 1

## 2020-06-14 MED FILL — METFORMIN 500 MG TAB: 500 mg | ORAL | Qty: 2

## 2020-06-14 MED FILL — DOK PLUS 8.6 MG-50 MG TABLET: ORAL | Qty: 1

## 2020-06-14 NOTE — Discharge Summary (Signed)
Ortho Discharge Summary    Patient ID:  Wendy Ali  [id number]  female  79 y.o.  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    PMH:   Past Medical History:   Diagnosis Date   ??? Arthritis    ??? Diabetes ([place])    ??? Hypercalcemia    ??? Hypercholesterolemia    ??? Hyperparathyroidism ([place])    ??? Hypertension    ??? Menopause    ??? Sleep apnea     cpap   ??? Vertigo     past hx       Body mass index is 31.3 kg/m??. : A BMI > 30 is classified as obesity and > 40 is classified as morbid obesity.     Medications upon admission :   Prior to Admission Medications   Prescriptions Last Dose Informant Patient Reported? Taking?   ascorbic acid, vitamin C, (Vitamin C) 500 mg tablet 06/12/2020 at Unknown time  Yes Yes   Sig: Take 500 mg by mouth daily.   aspirin 81 mg chewable tablet 06/09/2020  Yes No   Sig: Take 81 mg by mouth daily.   cholecalciferol (VITAMIN D3) (5000 Units /125 mcg) capsule 06/12/2020 at Unknown time  No Yes   Sig: Take 1 Cap by mouth daily.   cpap machine kit 06/12/2020 at Unknown time  Yes Yes   Sig: by Does Not Apply route.   cyanocobalamin 1,000 mcg tablet 06/12/2020 at Unknown time  Yes Yes   Sig:  Take 1,000 mcg by mouth daily.   metFORMIN (GLUCOPHAGE) 1,000 mg tablet 06/12/2020 at Unknown time  No Yes   Sig: TAKE 1 TABLET BY MOUTH  TWICE DAILY WITH MEALS FOR  TYPE 2 DIABETES MELLITUS   simvastatin (ZOCOR) 20 mg tablet 06/12/2020 at Unknown time  No Yes   Sig: Take 1 Tablet by mouth nightly. Indications: high cholesterol   valsartan-hydroCHLOROthiazide (DIOVAN-HCT) 80-12.5 mg per tablet 06/12/2020 at Unknown time  No Yes   Sig: Take 1 Tablet by mouth daily. Indications: high blood pressure   vitamin E (AQUA GEMS) 400 unit capsule 06/12/2020 at Unknown time  Yes Yes   Sig: Take 400 Units by mouth daily.      Facility-Administered Medications: None        Allergies:  No Known Allergies     Hospital Course:  The patient underwent surgery.  Complications:  None; patient tolerated the procedure well. Was taken to the PACU in  stable condition and then transferred to the ortho floor.      Perioperative Antibiotics:  Ancef     Postoperative Pain Management:  Tramadol & Tylenol     DVT Prophylaxis: Aspirin 325 BID    Postoperative transfusions:    Number of units banked PRBCs =   none     Post Op complications: none    Hemoglobin at discharge:    Lab Results   Component Value Date/Time    HGB 9.3 (L) 06/14/2020 02:48 AM    INR 1.0 05/20/2020 09:31 AM       Dressing remained clean, dry and intact. No significant erythema or swelling. Wound appears to be healing without any evidence of infection. Neurovascular exam found to be within normal limits.     Physical Therapy started following surgery and participated in bed mobility, transfers and ambulation.        Gait:  Gait  Base of Support: Widened  Speed/Cadence: Slow  Step Length: Right shortened, Left shortened  Gait Abnormalities: Antalgic, Decreased step clearance, Step to gait (inconsistent L knee ext in stance phase of gait)  Ambulation - Level of Assistance: Contact guard assistance  Distance (ft): 100 Feet (ft)  Assistive Device: Gait belt, Walker,  rolling  Interventions: Visual/Demos, Safety awareness training            Interventions: Visual/Demos, Safety awareness training      Discharged to: Home with HH.     Condition on Discharge:   stable    Discharge instructions:  - Anticoagulate with Aspirin 325 BID  - Take pain medications as prescribed  - Resume pre hospital diet      - Discharge activity: activity as tolerated  - Ambulate with assistive device as needed.  - Weight bearing status - WBAT  - Wound Care Keep wound clean and dry.  See discharge instruction sheet.            -DISCHARGE MEDICATION LIST     Current Discharge Medication List      START taking these medications    Details   aspirin delayed-release 325 mg tablet Take 1 Tablet by mouth two (2) times a day for 30 days.  Qty: 60 Tablet, Refills: 0  Start date: 06/14/2020, End date: 07/14/2020      acetaminophen (TYLENOL) 500 mg tablet Take 2 Tablets by mouth every six (6) hours for 7 days.  Qty: 56 Tablet, Refills: 0  Start date: 06/14/2020, End date: 06/21/2020      famotidine (PEPCID) 20 mg tablet Take 1 Tablet by mouth daily for 30 days.  Qty: 30 Tablet, Refills: 0  Start date: 06/15/2020, End date: 07/15/2020      ondansetron (ZOFRAN ODT) 4 mg disintegrating tablet Take 1 Tablet by mouth every six (6) hours as needed for Nausea or Vomiting for up to 7 days.  Qty: 10 Tablet, Refills: 0  Start date: 06/14/2020, End date: 06/21/2020    Associated Diagnoses: Status post total left knee replacement      senna-docusate (PERICOLACE) 8.6-50 mg per tablet Take 1 Tablet by mouth two (2) times a day for 7 days.  Qty: 14 Tablet, Refills: 0  Start date: 06/14/2020, End date: 06/21/2020      traMADoL (ULTRAM) 50 mg tablet Take 1 Tablet by mouth every six (6) hours as needed for Pain for up to 8 days. Max Daily Amount: 200 mg.  Qty: 30 Tablet, Refills: 0  Start date: 06/14/2020, End date: 06/22/2020    Associated  Diagnoses: Status post total left knee replacement         CONTINUE these medications which have  NOT CHANGED    Details   valsartan-hydroCHLOROthiazide (DIOVAN-HCT) 80-12.5 mg per tablet Take 1 Tablet by mouth daily. Indications: high blood pressure  Qty: 90 Tablet, Refills: 1    Associated Diagnoses: Hypertension, well controlled      simvastatin (ZOCOR) 20 mg tablet Take 1 Tablet by mouth nightly. Indications: high cholesterol  Qty: 90 Tablet, Refills: 1    Associated Diagnoses: Hypercholesterolemia      metFORMIN (GLUCOPHAGE) 1,000 mg tablet TAKE 1 TABLET BY MOUTH  TWICE DAILY WITH MEALS FOR  TYPE 2 DIABETES MELLITUS  Qty: 180 Tablet, Refills: 1    Comments: Requesting 1 year supply  Associated Diagnoses: Controlled type 2 diabetes mellitus with diabetic nephropathy, without long-term current use of insulin (HCC)      cholecalciferol (VITAMIN D3) (5000 Units /125 mcg) capsule Take 1 Cap by mouth daily.  Qty: 90 Cap, Refills: 2    Associated Diagnoses: Vitamin D deficiency      cyanocobalamin 1,000 mcg tablet Take 1,000 mcg by mouth daily.      ascorbic acid, vitamin C, (Vitamin C) 500 mg tablet Take 500 mg by mouth daily.      vitamin E (AQUA GEMS) 400 unit capsule Take 400 Units by mouth daily.      cpap machine kit by Does Not Apply route.         STOP taking these medications       aspirin 81 mg chewable tablet Comments:   Reason for Stopping:            per medical continuation form      -Follow up in office in 2 weeks      Signed:  Gearlean Alf, NP  Orthopaedic Nurse Practitioner    06/14/2020  2:30 PM

## 2020-06-14 NOTE — Progress Notes (Signed)
Formatting of this note might be different from the original.  [place]    Patient determined to be stable for discharge by attending provider. I have reviewed the discharge instructions with the patient. They verbalized understanding and all questions were answered to their satisfaction. No complaints or further questions were expressed.      Medications sent to pharmacy.  Appropriate educational materials and medication side effect teaching were provided.      PIV were removed prior to discharge.     Patient did not discharge with any line, foley, or drain.     Personal items and valuables accounted for at discharge by patient and/or family: YES    Post-op patient: Yes- Patient given post-op discharge kit and instructed on use.     Hedy Jacob, RN      Electronically signed by Hedy Jacob, RN at 06/14/2020  3:36 PM EST

## 2020-06-14 NOTE — Progress Notes (Signed)
Spiritual Care Partner Volunteer visited patient at [facility] in [facility] on 06/14/2020     Documented by:  Chaplain Rexene Agent, M.Div,Th.M, [place] Provider  Chaplain Paging Service [phone])

## 2020-06-14 NOTE — Progress Notes (Signed)
 Problem: Mobility Impaired (Adult and Pediatric)  Goal: *Acute Goals and Plan of Care (Insert Text)  Description: FUNCTIONAL STATUS PRIOR TO ADMISSION: Patient was independent and without use of DME, but limited by left knee pain. 1 fall on uneven ground in the last year.    HOME SUPPORT PRIOR TO ADMISSION: The patient lived with spouse but did not require assist. Lives in 1 story home with ramp to enter.    Physical Therapy Goals  Initiated 06/14/2020    1. Patient will move from supine to sit and sit to supine , scoot up and down, and roll side to side in bed with modified independence within 4 days.  2. Patient will perform sit to stand with supervision/set-up within 4 days.  3. Patient will ambulate with supervision/set-up for 350 feet with the least restrictive device within 4 days.  4. Patient will ascend/descend 4 stairs with 2 handrail(s) with minimal assistance/contact guard assist within 4 days.  5. Patient will perform home exercise program per protocol with supervision within 4 days.  6. Patient will demonstrate AROM 0-90 degrees in operative joint within 4 days.     Outcome: Progressing Towards Goal  PHYSICAL THERAPY EVALUATION  Patient: Wendy Ali (79 y.o. 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 She was started on TKR exercises with fair tolerance and needed aarom for slr and heel slides. Ambulated outside in hall with walker for about 100 feet, but started to feel nauseous. Returned to room via wheelchair. BP was 139/80 with 65 HR and 100% O2 sats on RA in  sitting. Will follow up this afternoon for 2nd session.    Current Level of Function Impacting Discharge (mobility/balance): min a supine to sit and sit to stand, cg ambulation with RW for 100 feet.    Functional Outcome Measure:  The patient scored 55/100 on the Barthel outcome measure which is indicative of 45% functional impairment.      Other factors to consider for discharge: good home support with spouse; independent PLOF; still having nausea     Patient will benefit from skilled therapy intervention to address the above noted impairments.       PLAN :  Recommendations and Planned Interventions: bed mobility training, transfer training, gait training, therapeutic exercises, neuromuscular re-education, modalities, edema management/control, patient and family training/education, and therapeutic activities      Frequency/Duration: Patient will be followed by physical therapy:  twice daily to address goals.    Recommendation for discharge: (in order for the patient to meet his/her long term goals)  Physical therapy at least 2 days/week in the home AND ensure assist and/or supervision for safety with mobility    This discharge recommendation:  Has been made in collaboration with the attending provider and/or case management    IF patient discharges home will need the following DME: rolling walker         SUBJECTIVE:   Patient stated " My nausea is gone. "    OBJECTIVE DATA SUMMARY:   HISTORY:    Past Medical History:   Diagnosis Date    Arthritis  Diabetes (HCC)     Hypercalcemia     Hypercholesterolemia     Hyperparathyroidism (HCC)     Hypertension     Menopause     Sleep apnea     cpap    Vertigo     past hx     Past Surgical History:   Procedure Laterality Date    HX BREAST BIOPSY Left 1984    benign    HX OTHER SURGICAL      colonoscopy    PR TOTAL KNEE ARTHROPLASTY Right 2002       Personal factors and/or comorbidities impacting plan of care: dm    Home Situation  Home Environment: Private residence  #  Steps to Enter: 0  Wheelchair Ramp: Yes  One/Two Story Residence: One story  Living Alone: No  Support Systems: Spouse/Significant Other  Patient Expects to be Discharged to:: House  Current DME Used/Available at Home: Blood pressure cuff, CPAP, Shower chair, Grab bars  Tub or Shower Type: Shower    EXAMINATION/PRESENTATION/DECISION MAKING:   Critical Behavior:  Neurologic State: Alert  Orientation Level: Oriented X4  Cognition: Appropriate decision making, Appropriate for age attention/concentration, Appropriate safety awareness, Follows commands  Safety/Judgement: Awareness of environment, Fall prevention  Hearing:  Auditory  Auditory Impairment: None  Hearing Aids/Status: Does not own  Skin:  L knee incision cdi  Edema: moderate L knee  Range Of Motion:  AROM: Generally decreased, functional           PROM: Generally decreased, functional (0-80 degrees L knee)           Strength:    Strength: Generally decreased, functional (L quad 3-/5; L ankle 3+/5; L hip 3-/5; 4/5 RLE)                    Tone & Sensation:   Tone: Normal              Sensation: Intact               Coordination:  Coordination: Within functional limits  Vision:   Acuity: Within Defined Limits  Functional Mobility:  Bed Mobility:  Rolling: Stand-by assistance  Supine to Sit: Minimum assistance (for LLE)     Scooting: Stand-by assistance  Transfers:  Sit to Stand: Minimum assistance  Stand to Sit: Contact guard assistance        Bed to Chair: Contact guard assistance; Adaptive equipment (RW)              Balance:   Sitting: Intact  Standing: Impaired  Standing - Static: Fair; Constant support  Standing - Dynamic : Fair; Constant support  Ambulation/Gait Training:  Distance (ft): 100 Feet (ft)  Assistive Device: Gait belt; Walker, rolling  Ambulation - Level of Assistance: Contact guard assistance     Gait Description (WDL): Exceptions to WDL  Gait Abnormalities: Antalgic; Decreased step clearance; Step to gait (inconsistent L knee ext in stance  phase of gait)  Right Side Weight Bearing: Full  Left Side Weight Bearing: As tolerated  Base of Support: Widened     Speed/Cadence: Slow  Step Length: Right shortened; Left shortened        Interventions: Visual/Demos; Safety awareness training           Therapeutic Exercises:   Ankle pumps, quad sets, heel slides, hip abd, slr.    Functional Measure:  Barthel Index:    Bathing: 0  Bladder: 10  Bowels: 10  Grooming: 5  Dressing:  5  Feeding: 10  Mobility: 0  Stairs: 0  Toilet Use: 5  Transfer (Bed to Chair and Back): 10  Total: 55/100       The Barthel ADL Index: Guidelines  1. The index should be used as a record of what a patient does, not as a record of what a patient could do.  2. The main aim is to establish degree of independence from any help, physical or verbal, however minor and for whatever reason.  3. The need for supervision renders the patient not independent.  4. A patient's performance should be established using the best available evidence. Asking the patient, friends/relatives and nurses are the usual sources, but direct observation and common sense are also important. However direct testing is not needed.  5. Usually the patient's performance over the preceding 24-48 hours is important, but occasionally longer periods will be relevant.  6. Middle categories imply that the patient supplies over 50 per cent of the effort.  7. Use of aids to be independent is allowed.    Score Interpretation (from Sinoff 1997)   80-100 Independent   60-79 Minimally independent   40-59 Partially dependent   20-39 Very dependent   <20 Totally dependent     -Mahoney, F.l., Barthel, D.W. (1965). Functional evaluation: the Barthel Index. [place] [address] [place] (14)2.  -Sinoff, G., Ore, L. (1997). The Barthel activities of daily living index: self-reporting versus actual performance in the old (> or = 75 years). Journal of American Geriatric Society 45(7), [phone].   -Fleeta cotton [place], J.J.M.F, Orman ROES., Oneita MERYL Sebastian DELORSE. (1999). Measuring the change in disability after inpatient rehabilitation; comparison of the responsiveness of the Barthel Index and Functional Independence Measure. Journal of Neurology, Neurosurgery, and Psychiatry, 66(4), [phone].  Marea Ch[place], N.J.A, Scholte op [place],  W.J.M, & Koop[place]chap, M.A. (2004) Assessment of post-stroke quality of life in cost-effectiveness studies: The usefulness of the Barthel Index and the EuroQoL-5D. Quality of Life Research, 25, 572-56        Physical Therapy Evaluation Charge Determination   History Examination Presentation Decision-Making   HIGH Complexity :3+ comorbidities / personal factors will impact the outcome/ POC  HIGH Complexity : 4+ Standardized tests and measures addressing body structure, function, activity limitation and / or participation in recreation  MEDIUM Complexity : Evolving with changing characteristics  Other outcome measures Barthel  MEDIUM      Based on the above components, the patient evaluation is determined to be of the following complexity level: MEDIUM    Pain Rating:  4/10 L knee     Activity Tolerance:   Fair, Poor, SpO2 stable on RA, requires rest breaks, and nausea    After treatment patient left in no apparent distress:   Supine in bed, Call bell within reach, and Caregiver / family present    COMMUNICATION/EDUCATION:   The patient's plan of care was discussed with: Registered nurse, Case management, Rehabilitation technician, and NP .     Fall prevention education was provided and the patient/caregiver indicated understanding., Patient/family have participated as able in goal setting and plan of care., and Patient/family agree to work toward stated goals and plan of care.    Thank you for this referral.  Norleen JONETTA Hoar, PT   Time Calculation: 28 mins

## 2020-06-14 NOTE — Progress Notes (Signed)
Ortho / Neurosurgery NP Note    POD# 1  s/p LEFT TOTAL KNEE ARTHROPLASTY   Pt seen with caregiver at bedside.     Pt resting in bed. Awake and alert today.   Reports minimal post-op pain, controlled with tylenol   Nausea improved from yesterday, no vomitting but nausea still present, RN to medicate with zofran.   Able to eat regular breakfast tray this morning.   (Reports hx PONV with previous surgeries)       VSS Afebrile. Room air.     Visit Vitals  BP (!) 150/75   Pulse 63   Temp 98.5 ??F (36.9 ??C)   Resp 18   Ht 5\' 4"  (1.626 m)   Wt 82.7 kg (182 lb 5.1 oz)   SpO2 98%   Breastfeeding No   BMI 31.30 kg/m??       Voiding status: voiding   Output (mL)  Urine Voided: 800 ml (06/14/20 0712)      Labs    Lab Results   Component Value Date/Time    HGB 9.3 (L) 06/14/2020 02:48 AM      Lab Results   Component Value Date/Time    INR 1.0 05/20/2020 09:31 AM      Lab Results   Component Value Date/Time    Sodium 139 06/14/2020 02:48 AM    Potassium 3.8 06/14/2020 02:48 AM    Chloride 107 06/14/2020 02:48 AM    CO2 25 06/14/2020 02:48 AM    Glucose 114 (H) 06/14/2020 02:48 AM    BUN 17 06/14/2020 02:48 AM    Creatinine 0.88 06/14/2020 02:48 AM    Calcium 8.9 06/14/2020 02:48 AM     Recent Glucose Results:   Lab Results   Component Value Date/Time    GLU 114 (H) 06/14/2020 02:48 AM    GLUCPOC 102 06/14/2020 06:47 AM    GLUCPOC 149 (H) 06/13/2020 09:38 PM    GLUCPOC 173 (H) 06/13/2020 04:36 PM           Body mass index is 31.3 kg/m??. : A BMI > 30 is classified as obesity and > 40 is classified as morbid obesity.       Dressing c.d.i  Cryotherapy in place over incision  Calves soft and supple; No pain with passive stretch  Sensation and motor intact - PF/DF/EHL intact. +2 pedal pulse   SCDs for mechanical DVT proph while in bed     PLAN:  1) PT BID - WBAT  2) Aspirin 325 mg PO BID for DVT Prophylaxis   3) GI Prophylaxis - Pepcid  4) Nausea - prn zofran.   5) Readniess for discharge:     [x]  Vital Signs stable    [x]  Hgb stable     [x]  + Voiding    [x]  Wound intact, drainage minimal    [x]  Tolerating PO intake     []  Cleared by PT (OT if applicable)     []  Stair training completed (if applicable)    []  Independent / Contact Guard Assist (household distance)     []  Bed mobility     []  Car transfers     []  ADL???s    [x]  Adequate pain control on oral medication alone     Discharge pending therapy clearance - will need pm therapy session.  Plans to return home with husband's support and HH.      Adriana Reams, NP

## 2020-06-14 NOTE — Progress Notes (Signed)
Formatting of this note is different from the original.    Problem: Mobility Impaired (Adult and Pediatric)  Goal: *Acute Goals and Plan of Care (Insert Text)  Description: FUNCTIONAL STATUS PRIOR TO ADMISSION: Patient was independent and without use of DME, but limited by left knee pain. 1 fall on uneven ground in the last year.    HOME SUPPORT PRIOR TO ADMISSION: The patient lived with spouse but did not require assist. Lives in 1 story home with ramp to enter.    Physical Therapy Goals  Initiated 06/14/2020    1. Patient will move from supine to sit and sit to supine , scoot up and down, and roll side to side in bed with modified independence within 4 days.  2. Patient will perform sit to stand with supervision/set-up within 4 days.  3. Patient will ambulate with supervision/set-up for 350 feet with the least restrictive device within 4 days.  4. Patient will ascend/descend 4 stairs with 2 handrail(s) with minimal assistance/contact guard assist within 4 days.  5. Patient will perform home exercise program per protocol with supervision within 4 days.  6. Patient will demonstrate AROM 0-90 degrees in operative joint within 4 days.      06/14/2020 1501 by Rosanne Ashing D, PT  Outcome: Progressing Towards Goal  06/14/2020 1339 by Heide Guile, PT  Outcome: Progressing Towards Goal   PHYSICAL THERAPY TREATMENT  Patient: Wendy Ali (79 y.o. 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    ASSESSMENT  Patient continues with skilled PT services and is progressing towards goals. Improved performance with all exercises. Less assistance needed for bed mobility, transfers and ambulation. No nausea this session. Gait tolerance improved to 185 feet with sba using RW.  Min a needed for car transfers - spouse present for training and confident he can assist patient at home. Deferred stairs as patient has a ramp to enter home. Clear for discharge  from PT standpoint.     Current Level of Function Impacting Discharge (mobility/balance): sba supine to sit and sit to stand; sba ambulation with RW 185 feet; min a car transfers.    Other factors to consider for discharge: spouse will assist patient once home; no nausea and VSS.      PLAN :  Patient continues to benefit from skilled intervention to address the above impairments.  Continue treatment per established plan of care.  to address goals.    Recommendation for discharge: (in order for the patient to meet his/her long term goals)  Physical therapy at least 2 days/week in the home AND ensure assist and/or supervision for safety with mobility.    This discharge recommendation:  Has been made in collaboration with the attending provider and/or case management    IF patient discharges home will need the following DME: rolling walker     SUBJECTIVE:   Patient stated ? I think I will do better this time.?    OBJECTIVE DATA SUMMARY:   Critical Behavior:  Neurologic State: Alert  Orientation Level: Oriented X4  Cognition: Appropriate decision making, Appropriate for age attention/concentration, Appropriate safety awareness, Follows commands  Safety/Judgement: Awareness of environment  Functional Mobility Training:  Bed Mobility:  Rolling: Stand-by assistance  Supine to Sit: Stand-by assistance; Additional time  Scooting: Modified independent      Transfers:  Sit to Stand: Stand-by assistance  Stand to Sit: Stand-by assistance    Bed to Chair: Stand-by assistance; Adaptive equipment (RW)            Balance:  Sitting: Intact  Standing: Impaired  Standing - Static: Good; Occasional  Standing - Dynamic : Good; Constant support  Ambulation/Gait Training:  Distance (ft): 185 Feet (ft)  Assistive Device: Gait belt; Walker, rolling  Ambulation -  Level of Assistance: Stand-by assistance    Gait Description (WDL): Exceptions to WDL  Gait Abnormalities: Antalgic  Right Side Weight Bearing: Full  Left Side Weight Bearing: As tolerated  Base of Support: Widened    Speed/Cadence: Pace decreased (<100 feet/min)  Step Length: Right shortened; Left shortened      Interventions: Safety awareness training        Therapeutic Exercises:   Ankle pumps, quad sets, heel slides, slr    Pain Rating:  3/10 L knee    Activity Tolerance:   Good and SpO2 stable on RA    After treatment patient left in no apparent distress:   Sitting in chair, Call bell within reach, and Caregiver / family present    COMMUNICATION/COLLABORATION:   The patient?s plan of care was discussed with: Registered nurse, Rehabilitation technician, and NP .     Heide Guile, PT   Time Calculation: 31 mins      Electronically signed by Heide Guile, PT at 06/14/2020  3:07 PM EST

## 2020-06-14 NOTE — Progress Notes (Signed)
Formatting of this note is different from the original.  End of Shift Note    Bedside shift change report given to Janett Billow, Therapist, sports (oncoming nurse) by Vanetta Shawl, LPN (offgoing nurse).  Report included the following information SBAR, Kardex, Procedure Summary, Intake/Output, MAR and Recent Results    Shift worked:  night    Shift summary and any significant changes:     Pt's vital signs have been stable and pt is voiding. Pt denies any pain.     Concerns for physician to address:  none.    Zone phone for oncoming shift:   [phone]      Activity:  Activity Level: Up with Assistance  Number times ambulated in hallways past shift: 0  Number of times OOB to chair past shift: 0    Cardiac:   Cardiac Monitoring: No      Cardiac Rhythm: Sinus Rhythm    Access:   Current line(s): PIV     Genitourinary:   Urinary status: voiding    Respiratory:   O2 Device: Nasal cannula  Chronic home O2 use?: NO  Incentive spirometer at bedside: YES    GI:    Current diet:  ADULT DIET Regular; 3 carb choices (45 gm/meal)  Passing flatus: YES  Tolerating current diet: YES      Pain Management:   Patient states pain is manageable on current regimen: YES    Skin:  Braden Score: 17  Interventions: float heels and increase time out of bed    Patient Safety:  Fall Score: Total Score: 3  Interventions: assistive device (walker, cane, etc), gripper socks and pt to call before getting OOB  High Fall Risk: Yes    Length of Stay:  Expected LOS: - - -  Actual LOS: [place]      Electronically signed by Vanetta Shawl at 06/14/2020  8:04 AM EST

## 2020-06-14 NOTE — Progress Notes (Signed)
End of Shift Note    Bedside shift change report given to Janett Billow, Therapist, sports (oncoming nurse) by Vanetta Shawl, LPN (offgoing nurse).  Report included the following information SBAR, Kardex, Procedure Summary, Intake/Output, MAR and Recent Results    Shift worked:  night     Shift summary and any significant changes:     Pt's vital signs have been stable and pt is voiding. Pt denies any pain.      Concerns for physician to address:  none.     Zone phone for oncoming shift:   [phone]       Activity:  Activity Level: Up with Assistance  Number times ambulated in hallways past shift: 0  Number of times OOB to chair past shift: 0    Cardiac:   Cardiac Monitoring: No      Cardiac Rhythm: Sinus Rhythm    Access:   Current line(s): PIV     Genitourinary:   Urinary status: voiding    Respiratory:   O2 Device: Nasal cannula  Chronic home O2 use?: NO  Incentive spirometer at bedside: YES     GI:     Current diet:  ADULT DIET Regular; 3 carb choices (45 gm/meal)  Passing flatus: YES  Tolerating current diet: YES       Pain Management:   Patient states pain is manageable on current regimen: YES    Skin:  Braden Score: 17  Interventions: float heels and increase time out of bed    Patient Safety:  Fall Score: Total Score: 3  Interventions: assistive device (walker, cane, etc), gripper socks and pt to call before getting OOB  High Fall Risk: Yes    Length of Stay:  Expected LOS: - - -  Actual LOS: [place]

## 2020-06-14 NOTE — Progress Notes (Signed)
Formatting of this note might be different from the original.  Transition of Care Plan:    RUR: n/a - OP  Disposition: Home with Home Health - referrals sent via [employer]   Follow up appointments: PCP and specialist as indicated   DME needed: RW needed - referral sent to [facility]   Transportation at Discharge: Husband to transport at [employer] or means to access home: Husband has  IM Medicare Letter: n/a - OP  Is patient a BCPI-A Bundle: No   If yes, was Bundle Letter given?:  n/a  Is patient a Veteran and connected with the [place]? No  If yes, was [employer] transfer form completed and VA notified?   Caregiver Contact: Spouse - Edrick Kins [phone]  Discharge Caregiver contacted prior to discharge?              Chart reviewed. CM met with pt to complete assessment and discuss discharge plan. Patient was alert and oriented. Pt admitted with Unilateral primary osteoarthritis, left knee. Verified contact information, demographics and insurance information. Prior to admission, pt resides with spouse in a 1 level home with 2 STE. At baseline, pt is independent with ADLs/IADLs. Pt owns no DME. Needs RW - referral sent to [facility] via [facility]. Pt has had home health in the past, but can't remember which agency. Preferred pharmacy is [facility] on [address]. Pt is agreeable to home health - FOC provided, referrals sent via [facility]. Unit CM to follow.     Care Management Interventions  PCP Verified by CM: Yes  Palliative Care Criteria Met (RRAT>21 & CHF Dx)?: No  Mode of Transport at Discharge: Other (see comment) (Spouse to transport at [employer])  Transition of Care Consult (CM Consult): Home Health  Discharge Durable Medical Equipment: Yes  Physical Therapy Consult: Yes  Occupational Therapy Consult: Yes  Speech Therapy Consult: No  Support Systems: Spouse/Significant Other  Confirm Follow Up Transport: Family  The Patient and/or Patient Representative was Provided with a Choice of Provider and Agrees with the Discharge Plan?:  Yes  [facility] of [facility] List was Provided with Basic Dialogue that Supports the Patient's Individualized Plan of Care/Goals, Treatment Preferences and Shares the Quality Data Associated with the Providers?: Yes  Veteran Resource Information Provided?: No  Discharge Location  Discharge Placement: Home with home health    Hattie Perch, [place], [place]  Electronically signed by Hattie Perch at 06/14/2020 10:57 AM EST

## 2020-06-14 NOTE — Progress Notes (Signed)
Formatting of this note is different from the original.    Problem: Mobility Impaired (Adult and Pediatric)  Goal: *Acute Goals and Plan of Care (Insert Text)  Description: FUNCTIONAL STATUS PRIOR TO ADMISSION: Patient was independent and without use of DME, but limited by left knee pain. 1 fall on uneven ground in the last year.    HOME SUPPORT PRIOR TO ADMISSION: The patient lived with spouse but did not require assist. Lives in 1 story home with ramp to enter.    Physical Therapy Goals  Initiated 06/14/2020    1. Patient will move from supine to sit and sit to supine , scoot up and down, and roll side to side in bed with modified independence within 4 days.  2. Patient will perform sit to stand with supervision/set-up within 4 days.  3. Patient will ambulate with supervision/set-up for 350 feet with the least restrictive device within 4 days.  4. Patient will ascend/descend 4 stairs with 2 handrail(s) with minimal assistance/contact guard assist within 4 days.  5. Patient will perform home exercise program per protocol with supervision within 4 days.  6. Patient will demonstrate AROM 0-90 degrees in operative joint within 4 days.    Outcome: Progressing Towards Goal  PHYSICAL THERAPY EVALUATION  Patient: Wendy Ali (79 y.o. 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 She was started on TKR exercises with fair tolerance and needed aarom for slr and heel slides. Ambulated outside in hall with walker for about 100 feet, but started to feel nauseous. Returned to room via  wheelchair. BP was 139/80 with 65 HR and 100% O2 sats on RA in sitting. Will follow up this afternoon for 2nd session.    Current Level of Function Impacting Discharge (mobility/balance): min a supine to sit and sit to stand, cg ambulation with RW for 100 feet.    Functional Outcome Measure:  The patient scored 55/100 on the Barthel outcome measure which is indicative of 45% functional impairment.      Other factors to consider for discharge: good home support with spouse; independent PLOF; still having nausea    Patient will benefit from skilled therapy intervention to address the above noted impairments.       PLAN :  Recommendations and Planned Interventions: bed mobility training, transfer training, gait training, therapeutic exercises, neuromuscular re-education, modalities, edema management/control, patient and family training/education, and therapeutic activities      Frequency/Duration: Patient will be followed by physical therapy:  twice daily to address goals.    Recommendation for discharge: (in order for the patient to meet his/her long term goals)  Physical therapy at least 2 days/week in the home AND ensure assist and/or supervision for safety with mobility    This discharge recommendation:  Has been made in collaboration with the attending provider and/or case management    IF patient discharges home will need the following DME: rolling walker     SUBJECTIVE:   Patient stated ? My nausea is gone. ?    OBJECTIVE DATA SUMMARY:   HISTORY:    Past Medical History:   Diagnosis Date  Arthritis     Diabetes ([place])     Hypercalcemia     Hypercholesterolemia     Hyperparathyroidism ([place])     Hypertension     Menopause     Sleep apnea     cpap    Vertigo     past hx     Past Surgical History:   Procedure Laterality Date    HX BREAST BIOPSY Left 1984    benign    HX OTHER SURGICAL      colonoscopy    PR TOTAL KNEE ARTHROPLASTY Right 2002     Personal factors and/or comorbidities impacting plan of care:  dm    Home Situation  Home Environment: Private residence  # Steps to Enter: 0  Wheelchair Ramp: Yes  One/Two Story Residence: One story  Living Alone: No  Support Systems: Spouse/Significant Other  Patient Expects to be Discharged to:: House  Current DME Used/Available at Home: Blood pressure cuff, CPAP, Shower chair, Grab bars  Tub or Shower Type: Shower    EXAMINATION/PRESENTATION/DECISION MAKING:   Critical Behavior:  Neurologic State: Alert  Orientation Level: Oriented X4  Cognition: Appropriate decision making, Appropriate for age attention/concentration, Appropriate safety awareness, Follows commands  Safety/Judgement: Awareness of environment, Fall prevention  Hearing:  Auditory  Auditory Impairment: None  Hearing Aids/Status: Does not own  Skin:  L knee incision cdi  Edema: moderate L knee  Range Of Motion:  AROM: Generally decreased, functional        PROM: Generally decreased, functional (0-80 degrees L knee)        Strength:    Strength: Generally decreased, functional (L quad 3-/5; L ankle 3+/5; L hip 3-/5; 4/5 RLE)              Tone & Sensation:   Tone: Normal          Sensation: Intact          Coordination:  Coordination: Within functional limits  Vision:   Acuity: Within Defined Limits  Functional Mobility:  Bed Mobility:  Rolling: Stand-by assistance  Supine to Sit: Minimum assistance (for LLE)    Scooting: Stand-by assistance  Transfers:  Sit to Stand: Minimum assistance  Stand to Sit: Contact guard assistance    Bed to Chair: Contact guard assistance; Adaptive equipment (RW)        Balance:   Sitting: Intact  Standing: Impaired  Standing - Static: Fair; Constant support  Standing - Dynamic : Fair; Constant support  Ambulation/Gait Training:  Distance (ft): 100 Feet (ft)  Assistive Device: Gait belt; Walker, rolling  Ambulation - Level of Assistance: Contact guard assistance    Gait Description (WDL): Exceptions to WDL  Gait Abnormalities: Antalgic; Decreased step clearance; Step to gait  (inconsistent L knee ext in stance phase of gait)  Right Side Weight Bearing: Full  Left Side Weight Bearing: As tolerated  Base of Support: Widened    Speed/Cadence: Slow  Step Length: Right shortened; Left shortened      Interventions: Visual/Demos; Safety awareness training        Therapeutic Exercises:   Ankle pumps, quad sets, heel slides, hip abd, slr.    Functional Measure:  Barthel Index:    Bathing: 0  Bladder: 10  Bowels: 10  Grooming: 5  Dressing: 5  Feeding: 10  Mobility: 0  Stairs: 0  Toilet Use: 5  Transfer (Bed to Chair and Back): 10  Total: 55/100      The Barthel ADL Index: Guidelines  1. The index should be used as a record of what a patient does, not as a record of what a patient could do.  2. The main aim is to establish degree of independence from any help, physical or verbal, however minor and for whatever reason.  3. The need for supervision renders the patient not independent.  4. A patient's performance should be established using the best available evidence. Asking the patient, friends/relatives and nurses are the usual sources, but direct observation and common sense are also important. However direct testing is not needed.  5. Usually the patient's performance over the preceding 24-48 hours is important, but occasionally longer periods will be relevant.  6. Middle categories imply that the patient supplies over 50 per cent of the effort.  7. Use of aids to be independent is allowed.    Score Interpretation (from [place])   80-100 Independent   60-79 Minimally independent   40-59 Partially dependent   20-39 Very dependent   <20 Totally dependent     -Mahoney, F.l., Barthel, D.W. (1965). Functional evaluation: the Barthel Index. [place] ([place]., [place] (1997). The Barthel activities of daily living index: self-reporting versus actual performance in the old (> or = 75 years). Journal of [place] 45(7), [place], J.J.M.F, Noni Saupe., Minette [place]. (1999). Measuring the change in disability after inpatient rehabilitation; comparison of the responsiveness of the Barthel Index and Functional Independence Measure. Journal of Neurology, Neurosurgery, and Psychiatry, 66(4), [phone].  [employer], N.J.A, [place],  W.J.M, & [place], M.A. (2004) Assessment of post-stroke quality of life in cost-effectiveness studies: The usefulness of the Barthel Index and the EuroQoL-5D. Quality of Life Research, 33, 427-43       Physical Therapy Evaluation Charge Determination   History Examination Presentation Decision-Making   HIGH Complexity :3+ comorbidities / personal factors will impact the outcome/ POC  HIGH Complexity : 4+ Standardized tests and measures addressing body structure, function, activity limitation and / or participation in recreation  MEDIUM Complexity : Evolving with changing characteristics  Other outcome measures Barthel  MEDIUM     Based on the above components, the patient evaluation is determined to be of the following complexity level: MEDIUM    Pain Rating:  4/10 L knee     Activity Tolerance:   Fair, Poor, SpO2 stable on RA, requires rest breaks, and nausea    After treatment patient left in no apparent distress:   Supine in bed, Call bell within reach, and Caregiver / family present    COMMUNICATION/EDUCATION:   The patient?s plan of care was discussed with: Registered nurse, Case management, Rehabilitation technician, and NP .     Fall prevention education was provided and the patient/caregiver indicated understanding., Patient/family have participated as able in goal setting and plan of care., and Patient/family agree to work toward stated goals and plan of care.    Thank you for this referral.  Heide Guile, PT   Time Calculation: 28 mins      Electronically signed by Heide Guile, PT at 06/14/2020  1:55 PM EST

## 2020-06-14 NOTE — Progress Notes (Signed)
Formatting of this note might be different from the original.  Patient clear for d/c from CM standpoint    Transition of Care Plan:  RUR: n/a outpatient status   Disposition: Home with [facility]  Follow up appointments: PCP and specialist as indicated   DME needed: RW- approved through [facility] and provided to patient at bedside    Transportation at Discharge: Husband to transport at [employer] or means to access home: Husband has  IM Medicare Letter: n/a - OP  Is patient a BCPI-A Bundle: No              If yes, was Bundle Letter given?:  n/a  Is patient a Veteran and connected with the [place]? No  If yes, was [employer] transfer form completed and VA notified?   Caregiver Contact: Spouse - Edrick Kins [phone]  Discharge Caregiver contacted prior to discharge? Yes    [place] has accepted for [facility] services. AVS updated. RW approved through [facility] and provided to patient at bedside.     Kathrynn Speed, [place], [place]    Electronically signed by Kathrynn Speed L at 06/14/2020  3:05 PM EST

## 2020-06-14 NOTE — Progress Notes (Signed)
Formatting of this note might be different from the original.    Problem: Knee Replacement: Day of Surgery/Unit  Goal: Off Pathway (Use only if patient is Off Pathway)  Outcome: Progressing Towards Goal  Goal: Activity/Safety  Outcome: Progressing Towards Goal  Goal: Consults, if ordered  Outcome: Progressing Towards Goal  Goal: Diagnostic Test/Procedures  Outcome: Progressing Towards Goal  Goal: Nutrition/Diet  Outcome: Progressing Towards Goal  Goal: Medications  Outcome: Progressing Towards Goal  Goal: Respiratory  Outcome: Progressing Towards Goal  Goal: Treatments/Interventions/Procedures  Outcome: Progressing Towards Goal  Goal: Psychosocial  Outcome: Progressing Towards Goal  Goal: *Initiate mobility  Outcome: Progressing Towards Goal  Goal: *Optimal pain control at patient's stated goal  Outcome: Progressing Towards Goal  Goal: *Hemodynamically stable  Outcome: Progressing Towards Goal    Problem: Knee Replacement: Post-Op Day 1  Goal: Off Pathway (Use only if patient is Off Pathway)  Outcome: Progressing Towards Goal  Goal: Activity/Safety  Outcome: Progressing Towards Goal  Goal: Diagnostic Test/Procedures  Outcome: Progressing Towards Goal  Goal: Nutrition/Diet  Outcome: Progressing Towards Goal  Goal: Medications  Outcome: Progressing Towards Goal  Goal: Respiratory  Outcome: Progressing Towards Goal  Goal: Treatments/Interventions/Procedures  Outcome: Progressing Towards Goal  Goal: Psychosocial  Outcome: Progressing Towards Goal  Goal: Discharge Planning  Outcome: Progressing Towards Goal  Goal: *Demonstrates progressive activity  Outcome: Progressing Towards Goal  Goal: *Optimal pain control at patient's stated goal  Outcome: Progressing Towards Goal  Goal: *Hemodynamically stable  Outcome: Progressing Towards Goal  Goal: *Discharge plan identified  Outcome: Progressing Towards Goal    Electronically signed by Hedy Jacob, RN at 06/14/2020  1:12 PM EST

## 2020-06-14 NOTE — Progress Notes (Signed)
Problem: Mobility Impaired (Adult and Pediatric)  Goal: *Acute Goals and Plan of Care (Insert Text)  Description: FUNCTIONAL STATUS PRIOR TO ADMISSION: Patient was independent and without use of DME, but limited by left knee pain. 1 fall on uneven ground in the last year.    HOME SUPPORT PRIOR TO ADMISSION: The patient lived with spouse but did not require assist. Lives in 1 story home with ramp to enter.    Physical Therapy Goals  Initiated 06/14/2020    1. Patient will move from supine to sit and sit to supine , scoot up and down, and roll side to side in bed with modified independence within 4 days.  2. Patient will perform sit to stand with supervision/set-up within 4 days.  3. Patient will ambulate with supervision/set-up for 350 feet with the least restrictive device within 4 days.  4. Patient will ascend/descend 4 stairs with 2 handrail(s) with minimal assistance/contact guard assist within 4 days.  5. Patient will perform home exercise program per protocol with supervision within 4 days.  6. Patient will demonstrate AROM 0-90 degrees in operative joint within 4 days.       06/14/2020 1501 by Molly Maduro D, PT  Outcome: Progressing Towards Goal  06/14/2020 1339 by Alba Cory, PT  Outcome: Progressing Towards Goal   PHYSICAL THERAPY TREATMENT  Patient: FONTELLA DELINE (79 y.o. 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    ASSESSMENT  Patient continues with skilled PT services and is progressing towards goals. Improved performance with all exercises. Less assistance needed for bed mobility, transfers and ambulation. No nausea this session. Gait tolerance improved to 185 feet with sba using RW. Min a needed for car transfers - spouse present for  training and confident he can assist patient at home. Deferred stairs as patient has a ramp to enter home. Clear for discharge  from PT standpoint.     Current Level of Function Impacting Discharge (mobility/balance): sba supine to sit and sit to stand; sba ambulation with RW 185 feet; min a car transfers.    Other factors to consider for discharge: spouse will assist patient once home; no nausea and VSS.         PLAN :  Patient continues to benefit from skilled intervention to address the above impairments.  Continue treatment per established plan of care.  to address goals.    Recommendation for discharge: (in order for the patient to meet his/her long term goals)  Physical therapy at least 2 days/week in the home AND ensure assist and/or supervision for safety with mobility.    This discharge recommendation:  Has been made in collaboration with the attending provider and/or case management    IF patient discharges home will need the following DME: rolling walker       SUBJECTIVE:   Patient stated " I think I will do better this time."    OBJECTIVE DATA SUMMARY:   Critical Behavior:  Neurologic State: Alert  Orientation Level: Oriented X4  Cognition: Appropriate decision making, Appropriate for age attention/concentration, Appropriate safety awareness, Follows commands  Safety/Judgement: Awareness of environment  Functional Mobility Training:  Bed Mobility:  Rolling: Stand-by assistance  Supine to Sit: Stand-by assistance; Additional time     Scooting: Modified independent  Transfers:  Sit to Stand: Stand-by assistance  Stand to Sit: Stand-by assistance        Bed to Chair: Stand-by assistance; Adaptive equipment (RW)                    Balance:  Sitting: Intact  Standing: Impaired  Standing - Static: Good; Occasional  Standing - Dynamic : Good; Constant support  Ambulation/Gait Training:  Distance (ft): 185 Feet (ft)  Assistive Device: Gait belt; Walker, rolling  Ambulation - Level of Assistance: Stand-by  assistance     Gait Description (WDL): Exceptions to WDL  Gait Abnormalities: Antalgic  Right Side Weight Bearing: Full  Left Side Weight Bearing: As tolerated  Base of Support: Widened     Speed/Cadence: Pace decreased (<100 feet/min)  Step Length: Right shortened; Left shortened        Interventions: Safety awareness training           Therapeutic Exercises:   Ankle pumps, quad sets, heel slides, slr    Pain Rating:  3/10 L knee    Activity Tolerance:   Good and SpO2 stable on RA    After treatment patient left in no apparent distress:   Sitting in chair, Call bell within reach, and Caregiver / family present    COMMUNICATION/COLLABORATION:   The patient's plan of care was discussed with: Registered nurse, Rehabilitation technician, and NP .     Alba Cory, PT   Time Calculation: 31 mins

## 2020-06-14 NOTE — Progress Notes (Signed)
Patient clear for d/c from CM standpoint    Transition of Care Plan:  RUR: n/a outpatient status   Disposition: Home with [facility]  Follow up appointments: PCP and specialist as indicated   DME needed: RW- approved through [facility] and provided to patient at bedside    Transportation at Discharge: Husband to transport at [facility] or means to access home: Husband has  IM Medicare Letter: n/a - OP  Is patient a BCPI-A Bundle: No              If yes, was Bundle Letter given?:  n/a  Is patient a Veteran and connected with the [place]? No  If yes, was [employer] transfer form completed and VA notified?   Caregiver Contact: Spouse - Edrick Kins [phone]  Discharge Caregiver contacted prior to discharge? Yes    [place] has accepted for [facility] services. AVS updated. RW approved through [facility] and provided to patient at bedside.     Kathrynn Speed, [place], [place]

## 2020-06-14 NOTE — Progress Notes (Signed)
Formatting of this note is different from the original.  Ortho / Neurosurgery NP Note    POD# 1  s/p LEFT TOTAL KNEE ARTHROPLASTY   Pt seen with caregiver at bedside.     Pt resting in bed. Awake and alert today.   Reports minimal post-op pain, controlled with tylenol   Nausea improved from yesterday, no vomitting but nausea still present, RN to medicate with zofran.   Able to eat regular breakfast tray this morning.   (Reports hx PONV with previous surgeries)      VSS Afebrile. Room air.     Visit Vitals  BP (!) 150/75   Pulse 63   Temp 98.5 F (36.9 C)   Resp 18   Ht 5\' 4"  (1.626 m)   Wt 82.7 kg (182 lb 5.1 oz)   SpO2 98%   Breastfeeding No   BMI 31.30 kg/m     Voiding status: voiding   Output (mL)  Urine Voided: 800 ml (06/14/20 0712)    Labs    Lab Results   Component Value Date/Time    HGB 9.3 (L) 06/14/2020 02:48 AM     Lab Results   Component Value Date/Time    INR 1.0 05/20/2020 09:31 AM     Lab Results   Component Value Date/Time    Sodium 139 06/14/2020 02:48 AM    Potassium 3.8 06/14/2020 02:48 AM    Chloride 107 06/14/2020 02:48 AM    CO2 25 06/14/2020 02:48 AM    Glucose 114 (H) 06/14/2020 02:48 AM    BUN 17 06/14/2020 02:48 AM    Creatinine 0.88 06/14/2020 02:48 AM    Calcium 8.9 06/14/2020 02:48 AM     Recent Glucose Results:   Lab Results   Component Value Date/Time    GLU 114 (H) 06/14/2020 02:48 AM    GLUCPOC 102 06/14/2020 06:47 AM    GLUCPOC 149 (H) 06/13/2020 09:38 PM    GLUCPOC 173 (H) 06/13/2020 04:36 PM     Body mass index is 31.3 kg/m. : A BMI > 30 is classified as obesity and > 40 is classified as morbid obesity.     Dressing c.d.i  Cryotherapy in place over incision  Calves soft and supple; No pain with passive stretch  Sensation and motor intact - PF/DF/EHL intact. +2 pedal pulse   SCDs for mechanical DVT proph while in bed    PLAN:  1) PT BID - WBAT  2) Aspirin 325 mg PO BID for DVT Prophylaxis   3) GI Prophylaxis - Pepcid  4) Nausea - prn zofran.   5) Readniess for discharge:     [x]   Vital Signs stable    [x]  Hgb stable    [x]  + Voiding    [x]  Wound intact, drainage minimal    [x]  Tolerating PO intake     []  Cleared by PT (OT if applicable)     []  Stair training completed (if applicable)    []  Independent / Contact Guard Assist (household distance)     []  Bed mobility     []  Car transfers     []  ADL?s    [x]  Adequate pain control on oral medication alone     Discharge pending therapy clearance - will need pm therapy session.  Plans to return home with husband's support and HH.      Adriana Reams, NP      Electronically signed by Adriana Reams, NP at 06/14/2020 11:34 AM EST

## 2020-06-14 NOTE — Progress Notes (Signed)
Formatting of this note might be different from the original.  Spiritual Care Partner Volunteer visited patient at [facility] in [facility] on 06/14/2020     Documented by:  Lenon Oms, M.Div,Th.M, [place] Provider  Chaplain Paging Service [phone])  Electronically signed by Rexene Agent at 06/14/2020  4:25 PM EST

## 2020-06-14 NOTE — Progress Notes (Signed)
Problem: Knee Replacement: Day of Surgery/Unit  Goal: Off Pathway (Use only if patient is Off Pathway)  Outcome: Progressing Towards Goal  Goal: Activity/Safety  Outcome: Progressing Towards Goal  Goal: Consults, if ordered  Outcome: Progressing Towards Goal  Goal: Diagnostic Test/Procedures  Outcome: Progressing Towards Goal  Goal: Nutrition/Diet  Outcome: Progressing Towards Goal  Goal: Medications  Outcome: Progressing Towards Goal  Goal: Respiratory  Outcome: Progressing Towards Goal  Goal: Treatments/Interventions/Procedures  Outcome: Progressing Towards Goal  Goal: Psychosocial  Outcome: Progressing Towards Goal  Goal: *Initiate mobility  Outcome: Progressing Towards Goal  Goal: *Optimal pain control at patient's stated goal  Outcome: Progressing Towards Goal  Goal: *Hemodynamically stable  Outcome: Progressing Towards Goal     Problem: Knee Replacement: Post-Op Day 1  Goal: Off Pathway (Use only if patient is Off Pathway)  Outcome: Progressing Towards Goal  Goal: Activity/Safety  Outcome: Progressing Towards Goal  Goal: Diagnostic Test/Procedures  Outcome: Progressing Towards Goal  Goal: Nutrition/Diet  Outcome: Progressing Towards Goal  Goal: Medications  Outcome: Progressing Towards Goal  Goal: Respiratory  Outcome: Progressing Towards Goal  Goal: Treatments/Interventions/Procedures  Outcome: Progressing Towards Goal  Goal: Psychosocial  Outcome: Progressing Towards Goal  Goal: Discharge Planning  Outcome: Progressing Towards Goal  Goal: *Demonstrates progressive activity  Outcome: Progressing Towards Goal  Goal: *Optimal pain control at patient's stated goal  Outcome: Progressing Towards Goal  Goal: *Hemodynamically stable  Outcome: Progressing Towards Goal  Goal: *Discharge plan identified  Outcome: Progressing Towards Goal

## 2020-06-14 NOTE — Discharge Summary (Signed)
Formatting of this note is different from the original.  Ortho Discharge Summary    Patient ID:  Wendy Ali  [id number]  female  79 y.o.  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    PMH:   Past Medical History:   Diagnosis Date   ? Arthritis    ? Diabetes ([place])    ? Hypercalcemia    ? Hypercholesterolemia    ? Hyperparathyroidism ([place])    ? Hypertension    ? Menopause    ? Sleep apnea     cpap   ? Vertigo     past hx     Body mass index is 31.3 kg/m. : A BMI > 30 is classified as obesity and > 40 is classified as morbid obesity.     Medications upon admission :   Prior to Admission Medications   Prescriptions Last Dose Informant Patient Reported? Taking?   ascorbic acid, vitamin C, (Vitamin C) 500 mg tablet 06/12/2020 at Unknown time  Yes Yes   Sig: Take 500 mg by mouth daily.   aspirin 81 mg chewable tablet 06/09/2020  Yes No   Sig: Take 81 mg by mouth daily.   cholecalciferol (VITAMIN D3) (5000 Units /125 mcg) capsule 06/12/2020 at Unknown time  No Yes   Sig: Take 1 Cap by mouth daily.   cpap machine kit 06/12/2020 at Unknown time  Yes Yes   Sig: by Does Not Apply route.   cyanocobalamin 1,000 mcg tablet 06/12/2020  at Unknown time  Yes Yes   Sig: Take 1,000 mcg by mouth daily.   metFORMIN (GLUCOPHAGE) 1,000 mg tablet 06/12/2020 at Unknown time  No Yes   Sig: TAKE 1 TABLET BY MOUTH  TWICE DAILY WITH MEALS FOR  TYPE 2 DIABETES MELLITUS   simvastatin (ZOCOR) 20 mg tablet 06/12/2020 at Unknown time  No Yes   Sig: Take 1 Tablet by mouth nightly. Indications: high cholesterol   valsartan-hydroCHLOROthiazide (DIOVAN-HCT) 80-12.5 mg per tablet 06/12/2020 at Unknown time  No Yes   Sig: Take 1 Tablet by mouth daily. Indications: high blood pressure   vitamin E (AQUA GEMS) 400 unit capsule 06/12/2020 at Unknown time  Yes Yes   Sig: Take 400 Units by mouth daily.     Facility-Administered Medications: None       Allergies:  No Known Allergies     Hospital Course:  The patient underwent surgery.  Complications:  None; patient tolerated the procedure well. Was taken to the PACU in stable condition and then transferred to the ortho floor.      Perioperative  Antibiotics:  Ancef     Postoperative Pain Management:  Tramadol & Tylenol     DVT Prophylaxis: Aspirin 325 BID    Postoperative transfusions:    Number of units banked PRBCs =   none     Post Op complications: none    Hemoglobin at discharge:    Lab Results   Component Value Date/Time    HGB 9.3 (L) 06/14/2020 02:48 AM    INR 1.0 05/20/2020 09:31 AM     Dressing remained clean, dry and intact. No significant erythema or swelling. Wound appears to be healing without any evidence of infection. Neurovascular exam found to be within normal limits.     Physical Therapy started following surgery and participated in bed mobility, transfers and ambulation.        Gait:  Gait  Base of Support: Widened  Speed/Cadence: Slow  Step Length: Right shortened, Left shortened  Gait Abnormalities: Antalgic, Decreased step clearance, Step to gait (inconsistent L knee ext in stance phase of gait)  Ambulation - Level of Assistance: Contact guard assistance  Distance (ft): 100 Feet (ft)  Assistive Device: Gait  belt, Walker, rolling  Interventions: Visual/Demos, Safety awareness training            Interventions: Visual/Demos, Safety awareness training    Discharged to: Home with HH.     Condition on Discharge:   stable    Discharge instructions:  - Anticoagulate with Aspirin 325 BID  - Take pain medications as prescribed  - Resume pre hospital diet      - Discharge activity: activity as tolerated  - Ambulate with assistive device as needed.  - Weight bearing status - WBAT  - Wound Care Keep wound clean and dry.  See discharge instruction sheet.      -DISCHARGE MEDICATION LIST    Current Discharge Medication List     START taking these medications    Details   aspirin delayed-release 325 mg tablet Take 1 Tablet by mouth two (2) times a day for 30 days.  Qty: 60 Tablet, Refills: 0  Start date: 06/14/2020, End date: 07/14/2020     acetaminophen (TYLENOL) 500 mg tablet Take 2 Tablets by mouth every six (6) hours for 7 days.  Qty: 56 Tablet, Refills: 0  Start date: 06/14/2020, End date: 06/21/2020     famotidine (PEPCID) 20 mg tablet Take 1 Tablet by mouth daily for 30 days.  Qty: 30 Tablet, Refills: 0  Start date: 06/15/2020, End date: 07/15/2020     ondansetron (ZOFRAN ODT) 4 mg disintegrating tablet Take 1 Tablet by mouth every six (6) hours as needed for Nausea or Vomiting for up to 7 days.  Qty: 10 Tablet, Refills: 0  Start date: 06/14/2020, End date: 06/21/2020    Associated Diagnoses: Status post total left knee replacement     senna-docusate (PERICOLACE) 8.6-50 mg per tablet Take 1 Tablet by mouth two (2) times a day for 7 days.  Qty: 14 Tablet, Refills: 0  Start date: 06/14/2020, End date: 06/21/2020     traMADoL (ULTRAM) 50 mg tablet Take 1 Tablet by mouth every six (6) hours as needed for Pain for up to 8 days. Max Daily Amount: 200 mg.  Qty: 30 Tablet, Refills: 0  Start date: 06/14/2020, End date: 06/22/2020    Associated Diagnoses: Status post total left knee replacement       CONTINUE these medications which have NOT  CHANGED    Details   valsartan-hydroCHLOROthiazide (DIOVAN-HCT) 80-12.5 mg per  tablet Take 1 Tablet by mouth daily. Indications: high blood pressure  Qty: 90 Tablet, Refills: 1    Associated Diagnoses: Hypertension, well controlled     simvastatin (ZOCOR) 20 mg tablet Take 1 Tablet by mouth nightly. Indications: high cholesterol  Qty: 90 Tablet, Refills: 1    Associated Diagnoses: Hypercholesterolemia     metFORMIN (GLUCOPHAGE) 1,000 mg tablet TAKE 1 TABLET BY MOUTH  TWICE DAILY WITH MEALS FOR  TYPE 2 DIABETES MELLITUS  Qty: 180 Tablet, Refills: 1    Comments: Requesting 1 year supply  Associated Diagnoses: Controlled type 2 diabetes mellitus with diabetic nephropathy, without long-term current use of insulin (HCC)     cholecalciferol (VITAMIN D3) (5000 Units /125 mcg) capsule Take 1 Cap by mouth daily.  Qty: 90 Cap, Refills: 2    Associated Diagnoses: Vitamin D deficiency     cyanocobalamin 1,000 mcg tablet Take 1,000 mcg by mouth daily.     ascorbic acid, vitamin C, (Vitamin C) 500 mg tablet Take 500 mg by mouth daily.     vitamin E (AQUA GEMS) 400 unit capsule Take 400 Units by mouth daily.     cpap machine kit by Does Not Apply route.       STOP taking these medications      aspirin 81 mg chewable tablet Comments:   Reason for Stopping:         per medical continuation form    -Follow up in office in 2 weeks    Signed:  Gearlean Alf, NP  Orthopaedic Nurse Practitioner    06/14/2020  2:30 PM      Electronically signed by Aaron Mose, DO at 06/14/2020  4:03 PM EST

## 2020-06-14 NOTE — Progress Notes (Signed)
DISCHARGE NOTE FROM [facility]    Patient determined to be stable for discharge by attending provider. I have reviewed the discharge instructions with the patient. They verbalized understanding and all questions were answered to their satisfaction. No complaints or further questions were expressed.      Medications sent to pharmacy.  Appropriate educational materials and medication side effect teaching were provided.      PIV were removed prior to discharge.     Patient did not discharge with any line, foley, or drain.     Personal items and valuables accounted for at discharge by patient and/or family: YES    Post-op patient: Yes- Patient given post-op discharge kit and instructed on use.       Hansel Starling, RN

## 2020-06-14 NOTE — Progress Notes (Signed)
Transition of Care Plan:    RUR: n/a - OP  Disposition: Home with Home Health - referrals sent via [employer]   Follow up appointments: PCP and specialist as indicated   DME needed: RW needed - referral sent to [facility]   Transportation at Discharge: Husband to transport at [employer] or means to access home: Husband has  IM Medicare Letter: n/a - OP  Is patient a BCPI-A Bundle: No   If yes, was Bundle Letter given?:  n/a  Is patient a Veteran and connected with the VA? No  If yes, was [employer] transfer form completed and VA notified?   Caregiver Contact: Spouse - Abigail Butts [phone]  Discharge Caregiver contacted prior to discharge?              Chart reviewed. CM met with pt to complete assessment and discuss discharge plan. Patient was alert and oriented. Pt admitted with Unilateral primary osteoarthritis, left knee. Verified contact information, demographics and insurance information. Prior to admission, pt resides with spouse in a 1 level home with 2 STE. At baseline, pt is independent with ADLs/IADLs. Pt owns no DME. Needs RW - referral sent to [facility] via [facility]. Pt has had home health in the past, but can't remember which agency. Preferred pharmacy is [facility] on [address]. Pt is agreeable to home health - FOC provided, referrals sent via [employer]. Unit CM to follow.     Care Management Interventions  PCP Verified by CM: Yes  Palliative Care Criteria Met (RRAT>21 & CHF Dx)?: No  Mode of Transport at Discharge: Other (see comment) (Spouse to transport at [employer])  Transition of Care Consult (CM Consult): Home Health  Discharge Durable Medical Equipment: Yes  Physical Therapy Consult: Yes  Occupational Therapy Consult: Yes  Speech Therapy Consult: No  Support Systems: Spouse/Significant Other  Confirm Follow Up Transport: Family  The Patient and/or Patient Representative was Provided with a Choice of Provider and Agrees with the Discharge Plan?: Yes  [facility] of Choice List was Provided with Basic Dialogue  that Supports the Patient's Individualized Plan of Care/Goals, Treatment Preferences and Shares the Quality Data Associated with the Providers?: Yes  Veteran Resource Information Provided?: No  Discharge Location  Discharge Placement: Home with home health    Rebbeca Paul, MSW  Care Manager, [facility]  [phone]

## 2020-06-15 ENCOUNTER — Encounter

## 2020-06-15 ENCOUNTER — Encounter: Admit: 2020-06-15 | Discharge: 2020-06-15 | Payer: MEDICARE | Primary: Internal Medicine

## 2020-06-18 ENCOUNTER — Encounter: Admit: 2020-06-18 | Discharge: 2020-06-18 | Payer: MEDICARE | Primary: Internal Medicine

## 2020-06-20 ENCOUNTER — Encounter: Primary: Internal Medicine

## 2020-06-20 ENCOUNTER — Encounter: Admit: 2020-06-20 | Discharge: 2020-06-20 | Payer: MEDICARE | Primary: Internal Medicine

## 2020-06-21 ENCOUNTER — Encounter: Primary: Internal Medicine

## 2020-06-22 ENCOUNTER — Encounter: Admit: 2020-06-22 | Discharge: 2020-06-22 | Payer: MEDICARE | Primary: Internal Medicine

## 2020-06-24 ENCOUNTER — Encounter: Admit: 2020-06-24 | Discharge: 2020-06-24 | Payer: MEDICARE | Primary: Internal Medicine

## 2020-06-24 ENCOUNTER — Encounter

## 2020-06-27 ENCOUNTER — Ambulatory Visit: Attending: Adult Reconstructive Orthopaedic Surgery | Primary: Internal Medicine

## 2020-06-27 ENCOUNTER — Ambulatory Visit
Admit: 2020-06-27 | Discharge: 2020-06-27 | Payer: MEDICARE | Attending: Adult Reconstructive Orthopaedic Surgery | Primary: Internal Medicine

## 2020-06-27 ENCOUNTER — Encounter: Admit: 2020-06-27 | Discharge: 2020-06-27 | Payer: MEDICARE | Primary: Internal Medicine

## 2020-06-27 ENCOUNTER — Inpatient Hospital Stay: Admit: 2020-06-27 | Payer: MEDICARE | Primary: Internal Medicine

## 2020-06-27 DIAGNOSIS — Z471 Aftercare following joint replacement surgery: Secondary | ICD-10-CM

## 2020-06-27 DIAGNOSIS — Z96651 Presence of right artificial knee joint: Secondary | ICD-10-CM

## 2020-06-27 MED ORDER — TRAMADOL 50 MG TAB
50 mg | ORAL_TABLET | Freq: Four times a day (QID) | ORAL | 0 refills | Status: AC | PRN
Start: 2020-06-27 — End: 2020-07-04

## 2020-06-27 NOTE — Progress Notes (Signed)
Wendy Ali  is here for a follow up visit from a total knee arthroplasty on the left side.  The patient is doing well, with no medical complications since discharge.  Pain has been appropriate since surgery,and the patient is progressing well with physical therapy.  Patient is ambulating with a walker.    Current Outpatient Medications on File Prior to Visit   Medication Sig Dispense Refill   ??? b complex vitamins (B Complex-Vitamin B12) tablet Take 1,000 mcg by mouth daily.     ??? ondansetron (ZOFRAN ODT) 4 mg disintegrating tablet Take 4 mg by mouth every six (6) hours as needed for Nausea or Vomiting.     ??? cholecalciferol (Vitamin D3) 25 mcg (1,000 unit) cap Take 1,000 Units by mouth daily.     ??? aspirin delayed-release 325 mg tablet Take 1 Tablet by mouth two (2) times a day for 30 days. 60 Tablet 0   ??? famotidine (PEPCID) 20 mg tablet Take 1 Tablet by mouth daily for 30 days. 30 Tablet 0   ??? valsartan-hydroCHLOROthiazide (DIOVAN-HCT) 80-12.5 mg per tablet Take 1 Tablet by mouth daily. Indications: high blood pressure 90 Tablet 1   ??? simvastatin (ZOCOR) 20 mg tablet Take 1 Tablet by mouth nightly. Indications: high cholesterol 90 Tablet 1   ??? metFORMIN (GLUCOPHAGE) 1,000 mg tablet TAKE 1 TABLET BY MOUTH  TWICE DAILY WITH MEALS FOR  TYPE 2 DIABETES MELLITUS 180 Tablet 1   ??? cholecalciferol (VITAMIN D3) (5000 Units /125 mcg) capsule Take 1 Cap by mouth daily. 90 Cap 2   ??? cyanocobalamin 1,000 mcg tablet Take 1,000 mcg by mouth daily.     ??? ascorbic acid, vitamin C, (Vitamin C) 500 mg tablet Take 500 mg by mouth daily.     ??? vitamin E (AQUA GEMS) 400 unit capsule Take 400 Units by mouth daily.     ??? cpap machine kit by Does Not Apply route.       No current facility-administered medications on file prior to visit.       ROS:  General: denies agitation, major chest pain, unexpected weakness  Patient complains of left knee pain which is  managed with tramadol.  Skin: healing wound is without issue or  drainage  Strength: appropriate weakness of involved extremity is resolving since surgery      Physical Examination:    Blood pressure (!) 157/63, pulse 95, temperature (!) 96.7 ??F (35.9 ??C), temperature source Tympanic, height 5' 4"  (1.626 m), weight 175 lb (79.4 kg), SpO2 98 %.    Dressing: Clean, Dry, Intact  Skin: no significant drainage, no wound dehiscence.   Range of motion: 5-95  Coronal plane stability is excellent  Sensation intact to light touch at level of wound and distally  Strength is 4/5 with knee flexion and extension  Leg lengths are clinically equal  Distal swelling is noted, but appropriate for postoperative course  Distal capillary refill less than 2 seconds      Imaging:    Postoperative xrays are reviewed, they indicate a left total knee arthroplasty in excellent position without evidence of loosening or failure.       Assessment: Status post left total knee arthroplasty, doing well    Plan:  Patient will continue physical therapy, with the goal of outpatient therapy and then home exercise program as soon as is possible  I have emphasized range of motion as her top priority at this point.  Wound care and dental prophylaxis instructions were reviewed  Continue  to weight bear as tolerated without restriction, except to keep to the positions of comfort.  Emphasis was placed on range of motion and strength exercises daily.  Deep Venous Thrombosis Prophylaxis: aspirin  Follow up will be at 3 weeks from now,  no xrays on follow up.        Wendy Ali has a reminder for a "due or due soon" health maintenance. I have asked that she contact her primary care provider for follow-up on this health maintenance.

## 2020-06-27 NOTE — Progress Notes (Signed)
 Identified pt with two pt identifiers (name and DOB). Reviewed chart in preparation for visit and have obtained necessary documentation.  Wendy Ali is a 79 y.o. female  Chief Complaint   Patient presents with   . Surgical Follow-up     LT knee     Visit Vitals  BP (!) 157/63 (BP 1 Location: Right arm, BP Patient Position: Sitting, BP Cuff Size: Large adult)   Pulse 95   Temp (!) 96.7 F (35.9 C) (Tympanic)   Ht 5' 4 (1.626 m)   Wt 175 lb (79.4 kg)   SpO2 98%   BMI 30.04 kg/m     1. Have you been to the ER, urgent care clinic since your last visit?  Hospitalized since your last visit?No    2. Have you seen or consulted any other health care providers outside of the [facility] since your last visit?  Include any pap smears or colon screening. No

## 2020-06-28 ENCOUNTER — Encounter: Primary: Internal Medicine

## 2020-06-30 LAB — RENAL FUNCTION PANEL
Albumin: 4.6 g/dL (ref 3.6–4.6)
Albumin: 4.6 g/dL (ref 3.6–4.6)
BUN/Creatinine ratio: 18 (ref 12–28)
BUN: 16 mg/dL (ref 8–27)
BUN: 16 mg/dL (ref 8–27)
Bun/Cre Ratio: 18 NA (ref 12–28)
CO2: 25 mmol/L (ref 20–29)
CO2: 25 mmol/L (ref 20–29)
Calcium: 11 mg/dL — ABNORMAL HIGH (ref 8.7–10.3)
Calcium: 11 mg/dL — ABNORMAL HIGH (ref 8.7–10.3)
Chloride: 99 mmol/L (ref 96–106)
Chloride: 99 mmol/L (ref 96–106)
Creatinine: 0.9 mg/dL (ref 0.57–1.00)
Creatinine: 0.9 mg/dL (ref 0.57–1.00)
EGFR IF NonAfrican American: 60 mL/min/{1.73_m2} (ref >59)
GFR African American: 69 mL/min/{1.73_m2} (ref >59)
GFR est AA: 69 mL/min/{1.73_m2} (ref >59)
GFR est non-AA: 60 mL/min/{1.73_m2} (ref >59)
Glucose: 170 mg/dL — ABNORMAL HIGH (ref 65–99)
Glucose: 170 mg/dL — ABNORMAL HIGH (ref 65–99)
Phosphorus: 2.9 mg/dL — ABNORMAL LOW (ref 3.0–4.3)
Phosphorus: 2.9 mg/dL — ABNORMAL LOW (ref 3.0–4.3)
Potassium: 4.4 mmol/L (ref 3.5–5.2)
Potassium: 4.4 mmol/L (ref 3.5–5.2)
Sodium: 141 mmol/L (ref 134–144)
Sodium: 141 mmol/L (ref 134–144)

## 2020-06-30 LAB — VITAMIN D, 25 HYDROXY: VITAMIN D, 25-HYDROXY: 44.4 ng/mL (ref 30.0–100.0)

## 2020-06-30 LAB — PTH INTACT: PTH, Intact: 42 pg/mL (ref 15–65)

## 2020-06-30 LAB — VITAMIN D 25 HYDROXY: Vit D, 25-Hydroxy: 44.4 ng/mL (ref 30.0–100.0)

## 2020-06-30 LAB — PTH, INTACT: PTH: 42 pg/mL (ref 15–65)

## 2020-07-06 ENCOUNTER — Ambulatory Visit: Attending: Internal Medicine | Primary: Internal Medicine

## 2020-07-06 ENCOUNTER — Ambulatory Visit: Admit: 2020-07-06 | Discharge: 2020-07-06 | Payer: MEDICARE | Attending: Internal Medicine | Primary: Internal Medicine

## 2020-07-06 NOTE — Progress Notes (Signed)
Chief Complaint   Patient presents with   ??? Elevated Blood Calcium     pcp and pharmacy verified     History of Present Illness: Wendy Ali is a 79 y.o. female here for follow up of  hypercalcemia and  hyperparathyroidism.    Pt presented to Dr. Leonor Liv in December 2020 for a new patient visit. At the time her Ca was 10.6 and her Vitamin D was 10.8 and her TSH was 1.29. Her renal function was good with Cr of 0.91.Dr. Leonor Liv repeated her Ca in February 2021 and at that time her Ca had declined to 10.3, but her PTH was 99.6.   She had a DXA scan and it did not show any evidence of osteoporosis or osteopenia.  She was started on Vitamin D 1000 units daily, which she had been taking for 6+ months.    At our initial visit in August 2021 I repeated her Vitamin D level which had improved to 36.3. Her 1,25-OH vitamin D was normal at 31.3 and her PTH had improved down to a normal range at 61.  Her Ca was 10.5.   Since her Ca level was under 11.2 and she is not having any symptoms or complications from the high calcium, we agreed to monitor her levels.    The records from her prior PCP in regards to her thyroid nodule stated that pt has prior thyroid nodule with FNA in October 2015 that was read as benign. She had a repeat US in November 2016 which showed no change in thyroid nodule from 2015.    She had right thyroid nodule measuring 1.8 x 1.8 x 1.1 cm and a left sided thyroid nodule measuring 4.7 x 2.6 x 2.9 cm, which was previously biopsied and read as benign.    The right thyroid nodule was NOT biopsied.    Her PTH in December 2021 was down to 42, her Vitamin D was 44 and her Ca was 11.0.  She had LEFT TOTAL KNEE ARTHROPLASTY on 06/13/20.  Prior to her surgery her Ca were 10.1 and 8.9.    Pt is still taking the vitamin D 1000 units daily.    She denies renal stones, hematuria or dysuria.  No fall or fractures since our last visit and no hx of osteoporosis.  She denies issues of AMS, confusion or mood swings.  She denies  any Ca our TUMS or any treatments for GERD.    Current Outpatient Medications   Medication Sig   ??? b complex vitamins (B Complex-Vitamin B12) tablet Take 1,000 mcg by mouth daily.   ??? cholecalciferol (Vitamin D3) 25 mcg (1,000 unit) cap Take 1,000 Units by mouth daily. TAKES 500 MCG DAILY   ??? aspirin delayed-release 325 mg tablet Take 1 Tablet by mouth two (2) times a day for 30 days.   ??? valsartan-hydroCHLOROthiazide (DIOVAN-HCT) 80-12.5 mg per tablet Take 1 Tablet by mouth daily. Indications: high blood pressure   ??? simvastatin (ZOCOR) 20 mg tablet Take 1 Tablet by mouth nightly. Indications: high cholesterol   ??? metFORMIN (GLUCOPHAGE) 1,000 mg tablet TAKE 1 TABLET BY MOUTH  TWICE DAILY WITH MEALS FOR  TYPE 2 DIABETES MELLITUS   ??? cyanocobalamin 1,000 mcg tablet Take 1,000 mcg by mouth daily.   ??? ascorbic acid, vitamin C, (Vitamin C) 500 mg tablet Take 500 mg by mouth daily.   ??? vitamin E (AQUA GEMS) 400 unit capsule Take 400 Units by mouth daily.   ??? cpap machine kit  by Does Not Apply route.   ??? ondansetron (ZOFRAN ODT) 4 mg disintegrating tablet Take 4 mg by mouth every six (6) hours as needed for Nausea or Vomiting. (Patient not taking: Reported on 07/06/2020)   ??? famotidine (PEPCID) 20 mg tablet Take 1 Tablet by mouth daily for 30 days. (Patient not taking: Reported on 07/06/2020)   ??? cholecalciferol (VITAMIN D3) (5000 Units /125 mcg) capsule Take 1 Cap by mouth daily. (Patient not taking: Reported on 07/06/2020)     No current facility-administered medications for this visit.     No Known Allergies  Review of Systems:  - Cardiovascular: no chest pain  - Neurological: no tremors  - Integumentary: skin is normal    Physical Examination:  Blood pressure 118/63, pulse 99, height 5' 4"  (1.626 m), weight 177 lb 3.2 oz (80.4 kg).  - General: pleasant, no distress, good eye contact   - Neck: no thyromegaly or thyroid bruits  - Cardiovascular: regular, normal rate, nl s1 and s2, no m/r/g   - Integumentary: skin is  normal, no edema  - Neurological: reflexes 2+ at biceps, no tremors  - Psychiatric: normal mood and affect    Data Reviewed:   Component      Latest Ref Rng & Units 06/29/2020   Glucose      65 - 99 mg/dL 170 (H)   BUN      8 - 27 mg/dL 16   Creatinine      0.57 - 1.00 mg/dL 0.90   GFR est non-AA      >59 mL/min/1.73 60   GFR est AA      >59 mL/min/1.73 69   BUN/Creatinine ratio      12 - 28 18   Sodium      134 - 144 mmol/L 141   Potassium      3.5 - 5.2 mmol/L 4.4   Chloride      96 - 106 mmol/L 99   CO2      20 - 29 mmol/L 25   Calcium      8.7 - 10.3 mg/dL 11.0 (H)   Phosphorus      3.0 - 4.3 mg/dL 2.9 (L)   Albumin      3.6 - 4.6 g/dL 4.6   VITAMIN D, 25-HYDROXY      30.0 - 100.0 ng/mL 44.4   PTH, Intact      15 - 65 pg/mL 42       Assessment/Plan:   1) Hypercalcemia > Her PTH has continued to decrease as her Vitamin D has normalized 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 Her Ca prior to her knee surgery was in a normal range. Her Ca last week was 11.0. Pt is not having any complications from the high Ca so I do not feel that there is anything that needs to be done at this time.  Will continue to monitor her levels.    2) Hyperparathyroidism > See #1.    3) Hypovitaminosis D > Pt to continue the Vitamin D 1000 units daily. Her level last week was up to 44.4.    4) Thyroid Nodule > Will order a thyroid US to monitor for any changes from her 2016 thyroid US.    Pt voices understanding and agreement with the plan.  Pain noted and pt was recommended to call her PCP for further evaluation and treatment, as needed    RTC 6 months  Follow-up and Dispositions    ??  Return in about 6 months (around 01/04/2021).         Copy sent to:  Dr. Felicie Morn

## 2020-07-18 ENCOUNTER — Encounter: Attending: Adult Reconstructive Orthopaedic Surgery | Primary: Internal Medicine

## 2020-07-22 ENCOUNTER — Ambulatory Visit: Attending: Adult Reconstructive Orthopaedic Surgery | Primary: Internal Medicine

## 2020-07-22 ENCOUNTER — Ambulatory Visit
Admit: 2020-07-22 | Discharge: 2020-07-22 | Payer: MEDICARE | Attending: Adult Reconstructive Orthopaedic Surgery | Primary: Internal Medicine

## 2020-07-22 DIAGNOSIS — Z471 Aftercare following joint replacement surgery: Secondary | ICD-10-CM

## 2020-07-22 DIAGNOSIS — Z96651 Presence of right artificial knee joint: Secondary | ICD-10-CM

## 2020-07-22 NOTE — Progress Notes (Signed)
Ms.Kinkead  is here for a follow up visit from a total knee arthroplasty on the left side.  The patient is doing well, with no medical complications since discharge.  Pain has been appropriate since surgery,and the patient is progressing well with physical therapy.  Patient is ambulating with no device.    Current Outpatient Medications on File Prior to Visit   Medication Sig Dispense Refill   ??? b complex vitamins (B Complex-Vitamin B12) tablet Take 1,000 mcg by mouth daily.     ??? cholecalciferol (Vitamin D3) 25 mcg (1,000 unit) cap Take 1,000 Units by mouth daily. TAKES 500 MCG DAILY     ??? valsartan-hydroCHLOROthiazide (DIOVAN-HCT) 80-12.5 mg per tablet Take 1 Tablet by mouth daily. Indications: high blood pressure 90 Tablet 1   ??? simvastatin (ZOCOR) 20 mg tablet Take 1 Tablet by mouth nightly. Indications: high cholesterol 90 Tablet 1   ??? metFORMIN (GLUCOPHAGE) 1,000 mg tablet TAKE 1 TABLET BY MOUTH  TWICE DAILY WITH MEALS FOR  TYPE 2 DIABETES MELLITUS 180 Tablet 1   ??? cyanocobalamin 1,000 mcg tablet Take 1,000 mcg by mouth daily.     ??? ascorbic acid, vitamin C, (Vitamin C) 500 mg tablet Take 500 mg by mouth daily.     ??? vitamin E (AQUA GEMS) 400 unit capsule Take 400 Units by mouth daily.     ??? cpap machine kit by Does Not Apply route.     ??? ondansetron (ZOFRAN ODT) 4 mg disintegrating tablet Take 4 mg by mouth every six (6) hours as needed for Nausea or Vomiting. (Patient not taking: Reported on 07/06/2020)     ??? cholecalciferol (VITAMIN D3) (5000 Units /125 mcg) capsule Take 1 Cap by mouth daily. (Patient not taking: Reported on 07/06/2020) 90 Cap 2     No current facility-administered medications on file prior to visit.       ROS:  General: denies agitation, major chest pain, unexpected weakness  Patient complains of left knee pain which is well managed with no medications.  Skin: healing wound is without issue or drainage  Strength: appropriate weakness of involved extremity is resolving since  surgery      Physical Examination:    Blood pressure 118/72, pulse 93, temperature (!) 96.2 ??F (35.7 ??C), temperature source Tympanic, height 5' 4"  (1.626 m), weight 174 lb (78.9 kg), SpO2 99 %.    Dressing: Clean, Dry, Intact  Skin: no significant drainage, no wound dehiscence.   Range of motion: 0-117  Coronal plane stability is excellent  Sensation intact to light touch at level of wound and distally  Strength is 5/5 with knee flexion and extension  Leg lengths are clinically equal  Distal swelling is noted, but appropriate for postoperative course  Distal capillary refill less than 2 seconds      Imaging:    Postoperative xrays are reviewed, they indicate a left total knee arthroplasty in excellent position without evidence of loosening or failure.       Assessment: Status post left total knee arthroplasty, doing well    Plan:  Patient will continue physical therapy, with the goal of outpatient therapy and then home exercise program as soon as is possible    Wound care and dental prophylaxis instructions were reviewed  Continue to weight bear as tolerated without restriction, except to keep to the positions of comfort.  Emphasis was placed on range of motion and strength exercises daily.  Deep Venous Thrombosis Prophylaxis: none  Follow up will be at 6  weeks from now,  no xrays on follow up.        Ms. Truax has a reminder for a "due or due soon" health maintenance. I have asked that she contact her primary care provider for follow-up on this health maintenance.

## 2020-07-22 NOTE — Progress Notes (Signed)
 Identified pt with two pt identifiers (name and DOB). Reviewed chart in preparation for visit and have obtained necessary documentation.  Wendy Ali is a 80 y.o. female  Chief Complaint   Patient presents with   . Surgical Follow-up     LT knee     Visit Vitals  BP 118/72 (BP 1 Location: Left upper arm, BP Patient Position: Sitting, BP Cuff Size: Large adult)   Pulse 93   Temp (!) 96.2 F (35.7 C) (Tympanic)   Ht 5' 4 (1.626 m)   Wt 174 lb (78.9 kg)   SpO2 99%   BMI 29.87 kg/m     1. Have you been to the ER, urgent care clinic since your last visit?  Hospitalized since your last visit?No    2. Have you seen or consulted any other health care providers outside of the [facility] since your last visit?  Include any pap smears or colon screening. No

## 2020-08-22 NOTE — Telephone Encounter (Signed)
Pt called and LVM 2/7 @ 12:12pm. Stated she was looking for her x-ray results but was told that dr Weston Settle would review them with her. Pt# [phone].

## 2020-08-22 NOTE — Telephone Encounter (Signed)
Patient said that she was not contacted to schedule the ultrasoud. Gave patient the telephone number to central scheduling to schedule the thyroid ultrasound.

## 2020-08-24 ENCOUNTER — Inpatient Hospital Stay: Admit: 2020-08-24 | Payer: MEDICARE | Attending: Internal Medicine | Primary: Internal Medicine

## 2020-08-24 ENCOUNTER — Encounter

## 2020-08-24 DIAGNOSIS — E042 Nontoxic multinodular goiter: Secondary | ICD-10-CM

## 2020-08-24 LAB — RENAL FUNCTION PANEL
Albumin: 4.8 g/dL — ABNORMAL HIGH (ref 3.6–4.6)
Albumin: 4.8 g/dL — ABNORMAL HIGH (ref 3.6–4.6)
BUN/Creatinine ratio: 17 (ref 12–28)
BUN: 16 mg/dL (ref 8–27)
BUN: 16 mg/dL (ref 8–27)
Bun/Cre Ratio: 17 NA (ref 12–28)
CO2: 23 mmol/L (ref 20–29)
CO2: 23 mmol/L (ref 20–29)
Calcium: 10.6 mg/dL — ABNORMAL HIGH (ref 8.7–10.3)
Calcium: 10.6 mg/dL — ABNORMAL HIGH (ref 8.7–10.3)
Chloride: 100 mmol/L (ref 96–106)
Chloride: 100 mmol/L (ref 96–106)
Creatinine: 0.95 mg/dL (ref 0.57–1.00)
Creatinine: 0.95 mg/dL (ref 0.57–1.00)
EGFR IF NonAfrican American: 56 mL/min/{1.73_m2} — ABNORMAL LOW (ref >59)
GFR African American: 65 mL/min/{1.73_m2} (ref >59)
GFR est AA: 65 mL/min/{1.73_m2} (ref >59)
GFR est non-AA: 56 mL/min/{1.73_m2} — ABNORMAL LOW (ref >59)
Glucose: 88 mg/dL (ref 65–99)
Glucose: 88 mg/dL (ref 65–99)
Phosphorus: 3.1 mg/dL (ref 3.0–4.3)
Phosphorus: 3.1 mg/dL (ref 3.0–4.3)
Potassium: 3.8 mmol/L (ref 3.5–5.2)
Potassium: 3.8 mmol/L (ref 3.5–5.2)
Sodium: 141 mmol/L (ref 134–144)
Sodium: 141 mmol/L (ref 134–144)

## 2020-08-24 LAB — PTH INTACT: PTH, Intact: 44 pg/mL (ref 15–65)

## 2020-08-24 LAB — VITAMIN D, 1, 25 DIHYDROXY: Calcitriol (Vit D 1, 25 di-OH): 42.9 pg/mL (ref 19.9–79.3)

## 2020-08-24 LAB — CKD REPORT

## 2020-08-24 LAB — VITAMIN D, 25 HYDROXY: VITAMIN D, 25-HYDROXY: 33.3 ng/mL (ref 30.0–100.0)

## 2020-08-24 LAB — VITAMIN D 1,25 DIHYDROXY: Calcitriol: 42.9 pg/mL (ref 19.9–79.3)

## 2020-08-24 LAB — PTH, INTACT: PTH: 44 pg/mL (ref 15–65)

## 2020-08-24 LAB — VITAMIN D 25 HYDROXY: Vit D, 25-Hydroxy: 33.3 ng/mL (ref 30.0–100.0)

## 2020-08-24 NOTE — Progress Notes (Signed)
Spoke with pt regarding her thyroid US. Based on her findings I recommend an FNA of the dominate right and dominate left nodules.  I will order the FNAs.    Pt voices understanding and agreement with the plan.

## 2020-08-24 NOTE — Telephone Encounter (Signed)
Spoke with pt regarding her labs.  Her Ca was 10.6, but he PTH was not elevated, her Phos was looking ok.  I do not feel she needs any further work up or intervention for her Ca at this time.  Will continue to monitor.

## 2020-09-01 ENCOUNTER — Encounter

## 2020-09-02 ENCOUNTER — Ambulatory Visit: Attending: Adult Reconstructive Orthopaedic Surgery | Primary: Internal Medicine

## 2020-09-02 ENCOUNTER — Ambulatory Visit
Admit: 2020-09-02 | Discharge: 2020-09-02 | Payer: MEDICARE | Attending: Adult Reconstructive Orthopaedic Surgery | Primary: Internal Medicine

## 2020-09-02 DIAGNOSIS — Z471 Aftercare following joint replacement surgery: Secondary | ICD-10-CM

## 2020-09-02 DIAGNOSIS — Z96651 Presence of right artificial knee joint: Secondary | ICD-10-CM

## 2020-09-02 MED ORDER — METFORMIN 1,000 MG TAB
1000 mg | ORAL_TABLET | ORAL | 3 refills | Status: DC
Start: 2020-09-02 — End: 2020-09-12

## 2020-09-02 NOTE — Progress Notes (Signed)
Wendy Ali  is here for a follow up visit from a total knee arthroplasty on the left side.  The patient is doing well, with no medical complications since discharge.  Pain has been appropriate since surgery,and the patient is progressing well with physical therapy.  Patient is ambulating with no device.    Current Outpatient Medications on File Prior to Visit   Medication Sig Dispense Refill   ??? metFORMIN (GLUCOPHAGE) 1,000 mg tablet TAKE 1 TABLET BY MOUTH  TWICE DAILY WITH MEALS FOR  TYPE 2 DIABETES MELLITUS 180 Tablet 3   ??? b complex vitamins (B Complex-Vitamin B12) tablet Take 1,000 mcg by mouth daily.     ??? cholecalciferol (Vitamin D3) 25 mcg (1,000 unit) cap Take 1,000 Units by mouth daily. TAKES 500 MCG DAILY     ??? valsartan-hydroCHLOROthiazide (DIOVAN-HCT) 80-12.5 mg per tablet Take 1 Tablet by mouth daily. Indications: high blood pressure 90 Tablet 1   ??? simvastatin (ZOCOR) 20 mg tablet Take 1 Tablet by mouth nightly. Indications: high cholesterol 90 Tablet 1   ??? cyanocobalamin 1,000 mcg tablet Take 1,000 mcg by mouth daily.     ??? ascorbic acid, vitamin C, (Vitamin C) 500 mg tablet Take 500 mg by mouth daily.     ??? vitamin E (AQUA GEMS) 400 unit capsule Take 400 Units by mouth daily.     ??? cpap machine kit by Does Not Apply route.     ??? ondansetron (ZOFRAN ODT) 4 mg disintegrating tablet Take 4 mg by mouth every six (6) hours as needed for Nausea or Vomiting. (Patient not taking: Reported on 07/06/2020)     ??? cholecalciferol (VITAMIN D3) (5000 Units /125 mcg) capsule Take 1 Cap by mouth daily. (Patient not taking: Reported on 07/06/2020) 90 Cap 2     No current facility-administered medications on file prior to visit.       ROS:  General: denies agitation, major chest pain, unexpected weakness  Patient complains of left knee pain which is well managed with no medications.  Skin: healing wound is without issue or drainage  Strength: appropriate weakness of involved extremity is resolving since  surgery      Physical Examination:    Blood pressure 129/76, pulse 88, temperature 96.8 ??F (36 ??C), temperature source Tympanic, height 5' 4" (1.626 m), weight 174 lb (78.9 kg), SpO2 100 %.    Dressing: Clean, Dry, Intact  Skin: no significant drainage, no wound dehiscence.   Range of motion: 0-110  Coronal plane stability is excellent  Sensation intact to light touch at level of wound and distally  Strength is 5/5 with knee flexion and extension  Leg lengths are clinically equal  Distal swelling is noted, but appropriate for postoperative course  Distal capillary refill less than 2 seconds      Imaging:    Postoperative xrays are reviewed, they indicate a left total knee arthroplasty in excellent position without evidence of loosening or failure.       Assessment: Status post left total knee arthroplasty, doing well    Plan:  Patient will continue physical therapy, with the goal of outpatient therapy and then home exercise program as soon as is possible    Wound care and dental prophylaxis instructions were reviewed  Continue to weight bear as tolerated without restriction, except to keep to the positions of comfort.  Emphasis was placed on range of motion and strength exercises daily.  Deep Venous Thrombosis Prophylaxis: none  Follow up will be at 6 weeks  from now,  no xrays on follow up.        Wendy Ali has a reminder for a "due or due soon" health maintenance. I have asked that she contact her primary care provider for follow-up on this health maintenance.

## 2020-09-02 NOTE — Progress Notes (Signed)
Identified pt with two pt identifiers (name and DOB). Reviewed chart in preparation for visit and have obtained necessary documentation.  Wendy Ali is a 80 y.o. female  Chief Complaint   Patient presents with   . Surgical Follow-up     LT knee     Visit Vitals  BP 129/76 (BP 1 Location: Right arm, BP Patient Position: Sitting, BP Cuff Size: Large adult)   Pulse 88   Temp 96.8 F (36 C) (Tympanic)   Ht 5\' 4"  (1.626 m)   Wt 174 lb (78.9 kg)   SpO2 100%   BMI 29.87 kg/m     1. Have you been to the ER, urgent care clinic since your last visit?  Hospitalized since your last visit?No    2. Have you seen or consulted any other health care providers outside of the [facility] since your last visit?  Include any pap smears or colon screening. No

## 2020-09-07 ENCOUNTER — Encounter

## 2020-09-08 ENCOUNTER — Ambulatory Visit: Payer: MEDICARE | Attending: Body Imaging | Primary: Internal Medicine

## 2020-09-08 ENCOUNTER — Encounter: Attending: Internal Medicine | Primary: Internal Medicine

## 2020-09-12 ENCOUNTER — Ambulatory Visit: Attending: Internal Medicine | Primary: Internal Medicine

## 2020-09-12 ENCOUNTER — Ambulatory Visit: Admit: 2020-09-12 | Payer: MEDICARE | Attending: Internal Medicine | Primary: Internal Medicine

## 2020-09-12 DIAGNOSIS — Z0000 Encounter for general adult medical examination without abnormal findings: Secondary | ICD-10-CM

## 2020-09-12 MED ORDER — VALSARTAN-HYDROCHLOROTHIAZIDE 80 MG-12.5 MG TAB
ORAL_TABLET | Freq: Every day | ORAL | 1 refills | Status: DC
Start: 2020-09-12 — End: 2021-02-24

## 2020-09-12 MED ORDER — SIMVASTATIN 20 MG TAB
20 mg | ORAL_TABLET | ORAL | 1 refills | Status: DC
Start: 2020-09-12 — End: 2020-09-12

## 2020-09-12 MED ORDER — METFORMIN 1,000 MG TAB
1000 mg | ORAL_TABLET | Freq: Two times a day (BID) | ORAL | 1 refills | Status: DC
Start: 2020-09-12 — End: 2021-03-29

## 2020-09-12 MED ORDER — SIMVASTATIN 20 MG TAB
20 mg | ORAL_TABLET | ORAL | 1 refills | Status: DC
Start: 2020-09-12 — End: 2021-02-24

## 2020-09-12 MED ORDER — VALSARTAN-HYDROCHLOROTHIAZIDE 80 MG-12.5 MG TAB
ORAL_TABLET | ORAL | 1 refills | Status: DC
Start: 2020-09-12 — End: 2020-09-12

## 2020-09-12 NOTE — Progress Notes (Signed)
Progress  Notes by [name], MD at 09/12/20 1440                Author: [name], MD  Service: --  Author Type: Physician       Filed: 09/12/20 1920  Encounter Date: 09/12/2020  Status: Signed          Editor: [name], MD (Physician)                 Chief Complaint       Patient presents with        ?  Follow-up             6 month Routine check up /  appt on 09/13/20 Dr. Weston Settle        HPI:   Wendy Ali is a 80 y.o.  AA female with h/o hypertension, hypercholesterolemia, diabetes type 2??presents for  6 month follow up. .   Patient has been doing well. Blood pressure is at goal. She has no complaints. She has appointment with her endocrinologist 09/13/2020.      This is a Subsequent Medicare Annual Wellness Exam (AWV) (Performed  12 months after IPPE or effective date of Medicare Part B enrollment)   I have reviewed the patient's medical history in detail and updated the computerized patient record.         History          Past Medical History:        Diagnosis  Date         ?  Arthritis       ?  Diabetes ([place])       ?  Hypercalcemia       ?  Hypercholesterolemia       ?  Hyperparathyroidism ([place])       ?  Hypertension       ?  Menopause       ?  Sleep apnea            cpap         ?  Vertigo            past hx           Past Surgical History:         Procedure  Laterality  Date          ?  HX BREAST BIOPSY  Left  1984          benign          ?  HX KNEE REPLACEMENT         ?  HX OTHER SURGICAL              colonoscopy          ?  PR TOTAL KNEE ARTHROPLASTY  Right  2002          Current Outpatient Medications          Medication  Sig  Dispense  Refill           ?  simvastatin (ZOCOR) 20 mg tablet  TAKE 1 TABLET BY MOUTH AT  NIGHT FOR HIGH CHOLESTEROL  90 Tablet  1     ?  valsartan-hydroCHLOROthiazide (DIOVAN-HCT) 80-12.5 mg per tablet  TAKE 1 TABLET BY MOUTH  DAILY FOR HIGH BLOOD  PRESSURE  90 Tablet  1           ?  metFORMIN (GLUCOPHAGE) 1,000 mg tablet  TAKE 1 TABLET BY MOUTH  TWICE DAILY  WITH MEALS FOR  TYPE 2 DIABETES MELLITUS  180 Tablet  3           ?  b complex vitamins (B Complex-Vitamin B12) tablet  Take 1,000 mcg by mouth daily.         ?  cholecalciferol (Vitamin D3) 25 mcg (1,000 unit) cap  Take 1,000 Units by mouth daily. TAKES 500 MCG DAILY         ?  cholecalciferol (VITAMIN D3) (5000 Units /125 mcg) capsule  Take 1 Cap by mouth daily.  90 Cap  2     ?  cyanocobalamin 1,000 mcg tablet  Take 1,000 mcg by mouth daily.         ?  ascorbic acid, vitamin C, (Vitamin C) 500 mg tablet  Take 500 mg by mouth daily.         ?  vitamin E (AQUA GEMS) 400 unit capsule  Take 400 Units by mouth daily.         ?  cpap machine kit  by Does Not Apply route.               ?  ondansetron (ZOFRAN ODT) 4 mg disintegrating tablet  Take 4 mg by mouth every six (6) hours as needed for Nausea or Vomiting. (Patient not taking: Reported on 07/06/2020)            No Known Allergies     Family History         Problem  Relation  Age of Onset          ?  No Known Problems  Mother                murdered          ?  Stroke  Father       ?  Hypertension  Father       ?  Diabetes  Sister       ?  Hypertension  Sister       ?  Hypertension  Brother       ?  Heart defect  Brother                defibrilattor          ?  Diabetes  Brother       ?  Heart Disease  Brother                has pacemaker          ?  Ovarian Cancer  Niece       ?  Cancer  Niece                x2          ?  No Known Problems  Maternal Grandmother       ?  No Known Problems  Maternal Grandfather       ?  No Known Problems  Paternal Grandmother            ?  No Known Problems  Paternal Grandfather            Social History          Tobacco Use         ?  Smoking status:  Never Smoker     ?  Smokeless tobacco:  Never Used       Substance Use Topics         ?  Alcohol use:  Yes              Alcohol/week:  1.0 standard drink         Types:  1 Glasses of wine per week             Comment: once a month          Patient Active Problem List        Diagnosis   Code         ?  Essential hypertension  I10     ?  Hyperlipidemia  E78.5     ?  Obstructive sleep apnea  G47.33     ?  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  Type 2 diabetes mellitus without complication (HCC)  O84.1     ?  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      Activities of Daily Living   The home contains: no safety equipment.   Patient does total self care      Fall Risk      Fall Risk Assessment, last 12 mths  09/12/2020        Able to walk?  Yes     Fall in past 12 months?  0     Do you feel unsteady?  0     Are you worried about falling  0     Number of falls in past 12 months  -        Fall with injury?  -           Abuse Screen   Patient is not abused        Cognitive Screening     Evaluation of Cognitive Function:   Has your family/caregiver stated any concerns about your memory: no   Normal        Patient Care Team     Patient Care Team:   [name], MD as PCP - General (Internal Medicine)   [name], MD as PCP - [facility] Empaneled Provider   Ayala-Sims, Shawnie Pons, MD as Physician (Internal Medicine)   Annamarie Major, MD as Physician (Cardiology)        Assessment/Plan     Education and counseling provided:   Are appropriate based on today's review and evaluation   Diagnoses and all orders for this  visit:  Encounter for Medicare annual wellness exam   -     METABOLIC PANEL, COMPREHENSIVE; Future   -     CBC WITH AUTOMATED DIFF; Future      Controlled type 2 diabetes mellitus with diabetic nephropathy, without long-term current use of insulin (HCC)   -     metFORMIN (GLUCOPHAGE) 1,000 mg tablet; Take 1 Tablet by mouth two (2) times daily (with meals)., Normal, Disp-180 Tablet, R-1Requesting 1 year supply   -     METABOLIC PANEL, COMPREHENSIVE; Future   -     CBC WITH AUTOMATED DIFF; Future   -     HEMOGLOBIN A1C WITH EAG; Future   -     MICROALBUMIN, UR, RAND W/ MICROALB/CREAT RATIO; Future      Hypercholesterolemia   -     simvastatin (ZOCOR) 20 mg tablet; TAKE 1 TABLET BY MOUTH AT  NIGHT FOR HIGH CHOLESTEROL, Normal, Disp-90 Tablet, R-1Requesting 1 year supply   -     LIPID PANEL; Future      Hypertension, well controlled   -     valsartan-hydroCHLOROthiazide (DIOVAN-HCT) 80-12.5 mg per tablet; Take 1 Tablet by mouth daily. FOR HIGH BLOOD PRESSURE, Normal, Disp-90 Tablet, R-1Requesting 1 year  supply   -     METABOLIC PANEL, COMPREHENSIVE; Future   -     CBC WITH AUTOMATED DIFF; Future      Encounter for immunization   -     PNEUMOCOCCAL POLYSACCHARIDE VACCINE, 23-VALENT, ADULT OR IMMUNOSUPPRESSED PT DOSE,      Screening for thyroid disorder   -     TSH 3RD GENERATION; Future      Frequency of urination   -     URINALYSIS W/ RFLX MICROSCOPIC; Future           Patient Instructions           Well Visit, Over 29: Care Instructions   Overview         Well visits can help you stay healthy. Your doctor has checked your overall health and may have suggested ways to take good care of yourself. Your doctor also may have recommended tests. At home, you can help prevent illness with healthy eating, regular  exercise, and other steps.   Follow-up care is a key part of your treatment and safety. Be sure to make and go to all appointments, and call your doctor if you are having problems. It's also a good idea to  know  your test results and keep a list of the medicines you take.   How can you care for yourself at home?   ??  Get screening tests that you and your doctor decide on. Screening helps find diseases before any symptoms appear.   ??  Eat healthy foods. Choose fruits, vegetables, whole grains, protein, and low-fat dairy foods. Limit fat, especially saturated fat. Reduce salt in your diet.   ??  Limit alcohol. If you are a man, have no more than 2 drinks a day or 14 drinks a week. If you are a woman, have no more than 1 drink a day or 7 drinks a week. Since alcohol affects older adults differently, you may want to limit alcohol even more. Or  you may not want to drink at all.   ??  Get at least 30 minutes of exercise on most days of the week. Walking is a good choice. You also may want to do other activities, such as running, swimming, cycling, or  playing tennis or team sports.   ??  Reach and stay at a healthy weight. This will lower your risk for many problems, such as obesity, diabetes, heart disease, and high blood pressure.   ??  Do not smoke. Smoking can make health problems worse. If you need help quitting, talk to your doctor about stop-smoking programs and medicines. These can increase your chances of quitting for good.   ??  Care for your mental health. It is easy to get weighed down by worry and stress. Learn strategies to manage stress, like deep breathing and mindfulness, and stay connected with your family and community. If you find you often feel sad or hopeless, talk  with your doctor. Treatment can help.   ??  Talk to your doctor about whether you have any risk factors for sexually transmitted infections (STIs). You can help prevent STIs if you wait to have sex with a new partner (or partners) until you've each been tested for STIs. It also helps if you use  condoms (female or female condoms) and if you limit your sex partners to one person who only has sex with you. Vaccines are available for some STIs.   ??  If you  think you may have a problem with alcohol or drug use, talk to your doctor. This includes prescription medicines (such as amphetamines and opioids) and illegal drugs (such as cocaine and methamphetamine). Your doctor can help you figure out what  type of treatment is best for you.   ??  Protect your skin from too much sun. When you're outdoors from 10 a.m. to 4 p.m., stay in the shade or cover up with clothing and a hat with a wide brim. Wear sunglasses that block UV rays. Even when it's cloudy, put broad-spectrum sunscreen (SPF 30 or  higher) on any exposed skin.   ??  See a dentist one or two times a year for checkups and to have your teeth cleaned.   ??  Wear a seat belt in the car.   When should you call for help?     Watch closely for changes in your health, and be sure to contact your doctor if you have any problems or symptoms that concern you.   Where can you learn more?   Go to  http://[facility]-rivera.info/   Enter [id number] in the search box to learn more about "Well Visit, Over 65: Care Instructions."   Current as of: August 27, 2019??????????????????????????????Content Version: 13.0   ?? 2006-2021 [employer].    Care instructions adapted under license by [employer] (which disclaims liability or warranty for this information). If you have questions about a medical condition or this instruction, always ask your  healthcare professional. [place] any warranty or liability for your use of this information.              Follow-up and Dispositions      ??  Return in about 6 months (around 03/12/2021), or if symptoms worsen or fail to improve, for routine follow up.

## 2020-09-13 ENCOUNTER — Encounter

## 2020-09-13 ENCOUNTER — Inpatient Hospital Stay: Admit: 2020-09-13 | Payer: MEDICARE | Attending: Diagnostic Radiology | Primary: Internal Medicine

## 2020-09-13 DIAGNOSIS — E041 Nontoxic single thyroid nodule: Secondary | ICD-10-CM

## 2020-09-13 LAB — CBC WITH AUTOMATED DIFF
ABS. BASOPHILS: 0.1 10*3/uL (ref 0.0–0.1)
ABS. EOSINOPHILS: 0.1 10*3/uL (ref 0.0–0.4)
ABS. IMM. GRANS.: 0 10*3/uL (ref 0.00–0.04)
ABS. LYMPHOCYTES: 2.3 10*3/uL (ref 0.8–3.5)
ABS. MONOCYTES: 0.5 10*3/uL (ref 0.0–1.0)
ABS. NEUTROPHILS: 2.1 10*3/uL (ref 1.8–8.0)
ABSOLUTE NRBC: 0 10*3/uL (ref 0.00–0.01)
BASOPHILS: 1 % (ref 0–1)
EOSINOPHILS: 1 % (ref 0–7)
HCT: 34.7 % — ABNORMAL LOW (ref 35.0–47.0)
HGB: 10.9 g/dL — ABNORMAL LOW (ref 11.5–16.0)
IMMATURE GRANULOCYTES: 0 % (ref 0.0–0.5)
LYMPHOCYTES: 46 % (ref 12–49)
MCH: 27.1 PG (ref 26.0–34.0)
MCHC: 31.4 g/dL (ref 30.0–36.5)
MCV: 86.3 FL (ref 80.0–99.0)
MONOCYTES: 9 % (ref 5–13)
MPV: 10 FL (ref 8.9–12.9)
NEUTROPHILS: 43 % (ref 32–75)
NRBC: 0 PER 100 WBC
PLATELET: 450 10*3/uL — ABNORMAL HIGH (ref 150–400)
RBC: 4.02 M/uL (ref 3.80–5.20)
RDW: 15.2 % — ABNORMAL HIGH (ref 11.5–14.5)
WBC: 5 10*3/uL (ref 3.6–11.0)

## 2020-09-13 LAB — METABOLIC PANEL, COMPREHENSIVE
A-G Ratio: 1.1 (ref 1.1–2.2)
ALT (SGPT): 15 U/L (ref 12–78)
AST (SGOT): 17 U/L (ref 15–37)
Albumin: 3.9 g/dL (ref 3.5–5.0)
Alk. phosphatase: 83 U/L (ref 45–117)
Anion gap: 5 mmol/L (ref 5–15)
BUN/Creatinine ratio: 24 — ABNORMAL HIGH (ref 12–20)
BUN: 22 MG/DL — ABNORMAL HIGH (ref 6–20)
Bilirubin, total: 0.3 MG/DL (ref 0.2–1.0)
CO2: 26 mmol/L (ref 21–32)
Calcium: 10.4 MG/DL — ABNORMAL HIGH (ref 8.5–10.1)
Chloride: 104 mmol/L (ref 97–108)
Creatinine: 0.91 MG/DL (ref 0.55–1.02)
GFR est AA: >60 mL/min/{1.73_m2} (ref >60)
GFR est non-AA: 59 mL/min/{1.73_m2} — ABNORMAL LOW (ref >60)
Globulin: 3.6 g/dL (ref 2.0–4.0)
Glucose: 117 mg/dL — ABNORMAL HIGH (ref 65–100)
Potassium: 4.1 mmol/L (ref 3.5–5.1)
Protein, total: 7.5 g/dL (ref 6.4–8.2)
Sodium: 135 mmol/L — ABNORMAL LOW (ref 136–145)

## 2020-09-13 LAB — LIPID PANEL
CHOL/HDL Ratio: 2 (ref 0.0–5.0)
Chol/HDL Ratio: 2 (ref 0.0–5.0)
Cholesterol, Total: 143 MG/DL (ref <200)
Cholesterol, total: 143 MG/DL (ref <200)
HDL Cholesterol: 73 MG/DL
HDL: 73 MG/DL
LDL Calculated: 57 MG/DL (ref 0–100)
LDL, calculated: 57 MG/DL (ref 0–100)
Triglyceride: 65 MG/DL (ref <150)
Triglycerides: 65 MG/DL (ref <150)
VLDL Cholesterol Calculated: 13 MG/DL
VLDL, calculated: 13 MG/DL

## 2020-09-13 LAB — URINALYSIS W/ RFLX MICROSCOPIC
BACTERIA, URINE: NEGATIVE /hpf
Bacteria: NEGATIVE /hpf
Bilirubin, Urine: NEGATIVE
Bilirubin: NEGATIVE
Glucose, Ur: NEGATIVE mg/dL
Glucose: NEGATIVE mg/dL
Ketone: NEGATIVE mg/dL
Ketones, Urine: NEGATIVE mg/dL
Nitrite, Urine: NEGATIVE
Nitrites: NEGATIVE
Protein, UA: NEGATIVE mg/dL
Protein: NEGATIVE mg/dL
Specific Gravity, UA: 1.02 (ref 1.003–1.030)
Specific gravity: 1.02 (ref 1.003–1.030)
Urobilinogen, UA, POCT: 0.2 EU/dL (ref 0.2–1.0)
Urobilinogen: 0.2 EU/dL (ref 0.2–1.0)
pH (UA): 5 (ref 5.0–8.0)
pH, UA: 5 (ref 5.0–8.0)

## 2020-09-13 LAB — MICROALBUMIN, UR, RAND W/ MICROALB/CREAT RATIO
Creatinine, urine random: 111 mg/dL
Microalbumin,urine random: 3.25 MG/DL
Microalbumin/Creat ratio (mg/g creat): 29 mg/g (ref 0–30)

## 2020-09-13 LAB — HEMOGLOBIN A1C WITH EAG
Est. average glucose: 94 mg/dL
Hemoglobin A1c: 4.9 % (ref 4.0–5.6)

## 2020-09-13 LAB — TSH 3RD GENERATION
TSH: 0.75 u[IU]/mL (ref 0.36–3.74)
TSH: 0.75 u[IU]/mL (ref 0.36–3.74)

## 2020-09-13 LAB — HEMOGLOBIN A1C W/EAG
Hemoglobin A1C: 4.9 % (ref 4.0–5.6)
eAG: 94 mg/dL

## 2020-09-13 LAB — COMPREHENSIVE METABOLIC PANEL
ALT: 15 U/L (ref 12–78)
AST: 17 U/L (ref 15–37)
Albumin/Globulin Ratio: 1.1 (ref 1.1–2.2)
Albumin: 3.9 g/dL (ref 3.5–5.0)
Alkaline Phosphatase: 83 U/L (ref 45–117)
Anion Gap: 5 mmol/L (ref 5–15)
BUN: 22 MG/DL — ABNORMAL HIGH (ref 6–20)
Bun/Cre Ratio: 24 — ABNORMAL HIGH (ref 12–20)
CO2: 26 mmol/L (ref 21–32)
Calcium: 10.4 MG/DL — ABNORMAL HIGH (ref 8.5–10.1)
Chloride: 104 mmol/L (ref 97–108)
Creatinine: 0.91 MG/DL (ref 0.55–1.02)
EGFR IF NonAfrican American: 59 mL/min/{1.73_m2} — ABNORMAL LOW (ref >60)
GFR African American: >60 mL/min/{1.73_m2} (ref >60)
Globulin: 3.6 g/dL (ref 2.0–4.0)
Glucose: 117 mg/dL — ABNORMAL HIGH (ref 65–100)
Potassium: 4.1 mmol/L (ref 3.5–5.1)
Sodium: 135 mmol/L — ABNORMAL LOW (ref 136–145)
Total Bilirubin: 0.3 MG/DL (ref 0.2–1.0)
Total Protein: 7.5 g/dL (ref 6.4–8.2)

## 2020-09-13 LAB — CBC WITH AUTO DIFFERENTIAL
Basophils %: 1 % (ref 0–1)
Basophils Absolute: 0.1 10*3/uL (ref 0.0–0.1)
Eosinophils %: 1 % (ref 0–7)
Eosinophils Absolute: 0.1 10*3/uL (ref 0.0–0.4)
Granulocyte Absolute Count: 0 10*3/uL (ref 0.00–0.04)
Hematocrit: 34.7 % — ABNORMAL LOW (ref 35.0–47.0)
Hemoglobin: 10.9 g/dL — ABNORMAL LOW (ref 11.5–16.0)
Immature Granulocytes: 0 % (ref 0.0–0.5)
Lymphocytes %: 46 % (ref 12–49)
Lymphocytes Absolute: 2.3 10*3/uL (ref 0.8–3.5)
MCH: 27.1 PG (ref 26.0–34.0)
MCHC: 31.4 g/dL (ref 30.0–36.5)
MCV: 86.3 FL (ref 80.0–99.0)
MPV: 10 FL (ref 8.9–12.9)
Monocytes %: 9 % (ref 5–13)
Monocytes Absolute: 0.5 10*3/uL (ref 0.0–1.0)
NRBC Absolute: 0 10*3/uL (ref 0.00–0.01)
Neutrophils %: 43 % (ref 32–75)
Neutrophils Absolute: 2.1 10*3/uL (ref 1.8–8.0)
Nucleated RBCs: 0 PER 100 WBC
Platelets: 450 10*3/uL — ABNORMAL HIGH (ref 150–400)
RBC: 4.02 M/uL (ref 3.80–5.20)
RDW: 15.2 % — ABNORMAL HIGH (ref 11.5–14.5)
WBC: 5 10*3/uL (ref 3.6–11.0)

## 2020-09-13 LAB — MICROALBUMIN / CREATININE URINE RATIO
Creatinine, Ur: 111 mg/dL
Microalb, Ur: 3.25 MG/DL
Microalbumin Creatinine Ratio: 29 mg/g (ref 0–30)

## 2020-09-13 NOTE — Progress Notes (Signed)
Spoke with pt regarding the FNA results. The pathology on both nodules were read as benign.

## 2020-09-14 ENCOUNTER — Encounter

## 2020-09-14 MED ORDER — NITROFURANTOIN (25% MACROCRYSTAL FORM) 100 MG CAP
100 mg | ORAL_CAPSULE | Freq: Two times a day (BID) | ORAL | 0 refills | Status: DC
Start: 2020-09-14 — End: 2021-01-04

## 2020-10-14 ENCOUNTER — Ambulatory Visit: Attending: Adult Reconstructive Orthopaedic Surgery | Primary: Internal Medicine

## 2020-10-14 ENCOUNTER — Ambulatory Visit
Admit: 2020-10-14 | Discharge: 2020-10-14 | Payer: MEDICARE | Attending: Adult Reconstructive Orthopaedic Surgery | Primary: Internal Medicine

## 2020-10-14 DIAGNOSIS — Z471 Aftercare following joint replacement surgery: Secondary | ICD-10-CM

## 2020-10-14 DIAGNOSIS — Z96651 Presence of right artificial knee joint: Secondary | ICD-10-CM

## 2020-10-14 MED ORDER — AMOXICILLIN-CLAVULANATE 500 MG-125 MG TAB
500-125 mg | ORAL_TABLET | Freq: Once | ORAL | 1 refills | Status: AC
Start: 2020-10-14 — End: 2020-10-14

## 2020-10-14 NOTE — Progress Notes (Signed)
Identified pt with two pt identifiers (name and DOB). Reviewed chart in preparation for visit and have obtained necessary documentation.  Wendy Ali is a 80 y.o. female  Chief Complaint   Patient presents with   . Surgical Follow-up     LT Knee     Visit Vitals  BP 114/71 (BP 1 Location: Right arm, BP Patient Position: Sitting, BP Cuff Size: Large adult)   Pulse 94   Temp 97.2 F (36.2 C) (Tympanic)   Ht 5\' 4"  (1.626 m)   Wt 174 lb (78.9 kg)   SpO2 100%   BMI 29.87 kg/m     1. Have you been to the ER, urgent care clinic since your last visit?  Hospitalized since your last visit?No    2. Have you seen or consulted any other health care providers outside of the [facility] since your last visit?  Include any pap smears or colon screening. No

## 2020-10-14 NOTE — Progress Notes (Signed)
10/14/2020    Chief Complaint: Follow up for left knee replacement    HPI: Wendy Ali is a 80 y.o.  female who is now four months status post left total knee arthroplasty.  The patient states that they are doing well.  The preoperative pain is gone and the postoperative pain is gone.  The pain is minimal in intensity.  Regular exercises have helped with residual symptoms.        Past Medical History:   Diagnosis Date   ??? Arthritis    ??? Diabetes ([place])    ??? Hypercalcemia    ??? Hypercholesterolemia    ??? Hyperparathyroidism ([place])    ??? Hypertension    ??? Menopause    ??? Sleep apnea     cpap   ??? Vertigo     past hx       Past Surgical History:   Procedure Laterality Date   ??? HX BREAST BIOPSY Left 1984    benign   ??? HX KNEE REPLACEMENT     ??? HX OTHER SURGICAL      colonoscopy   ??? PR TOTAL KNEE ARTHROPLASTY Right 2002       Current Outpatient Medications on File Prior to Visit   Medication Sig Dispense Refill   ??? nitrofurantoin, macrocrystal-monohydrate, (MACROBID) 100 mg capsule Take 1 Capsule by mouth two (2) times a day. 14 Capsule 0   ??? metFORMIN (GLUCOPHAGE) 1,000 mg tablet Take 1 Tablet by mouth two (2) times daily (with meals). 180 Tablet 1   ??? simvastatin (ZOCOR) 20 mg tablet TAKE 1 TABLET BY MOUTH AT  NIGHT FOR HIGH CHOLESTEROL 90 Tablet 1   ??? valsartan-hydroCHLOROthiazide (DIOVAN-HCT) 80-12.5 mg per tablet Take 1 Tablet by mouth daily. FOR HIGH BLOOD PRESSURE 90 Tablet 1   ??? b complex vitamins (B Complex-Vitamin B12) tablet Take 1,000 mcg by mouth daily.     ??? ondansetron (ZOFRAN ODT) 4 mg disintegrating tablet Take 4 mg by mouth every six (6) hours as needed for Nausea or Vomiting. (Patient not taking: Reported on 07/06/2020)     ??? cholecalciferol (Vitamin D3) 25 mcg (1,000 unit) cap Take 1,000 Units by mouth daily. TAKES 500 MCG DAILY     ??? cholecalciferol (VITAMIN D3) (5000 Units /125 mcg) capsule Take 1 Cap by mouth daily. 90 Cap 2   ??? cyanocobalamin 1,000 mcg tablet Take 1,000 mcg by mouth daily.     ??? ascorbic  acid, vitamin C, (Vitamin C) 500 mg tablet Take 500 mg by mouth daily.     ??? vitamin E (AQUA GEMS) 400 unit capsule Take 400 Units by mouth daily.     ??? cpap machine kit by Does Not Apply route.       No current facility-administered medications on file prior to visit.       No Known Allergies    Family History   Problem Relation Age of Onset   ??? No Known Problems Mother         murdered   ??? Stroke Father    ??? Hypertension Father    ??? Diabetes Sister    ??? Hypertension Sister    ??? Hypertension Brother    ??? Heart defect Brother         defibrilattor   ??? Diabetes Brother    ??? Heart Disease Brother         has pacemaker   ??? Ovarian Cancer Niece    ??? Cancer Niece  x2   ??? No Known Problems Maternal Grandmother    ??? No Known Problems Maternal Grandfather    ??? No Known Problems Paternal Grandmother    ??? No Known Problems Paternal Grandfather        Social History     Socioeconomic History   ??? Marital status: MARRIED   Tobacco Use   ??? Smoking status: Never Smoker   ??? Smokeless tobacco: Never Used   Vaping Use   ??? Vaping Use: Never used   Substance and Sexual Activity   ??? Alcohol use: Yes     Alcohol/week: 1.0 standard drink     Types: 1 Glasses of wine per week     Comment: once a month   ??? Drug use: Never   ??? Sexual activity: Not Currently         Review of Systems:       General: Denies headache, lethargy, fever, weight loss  Ears/Nose/Throat: Denies ear discharge, drainage, nosebleeds, hoarse voice, dental problems  Cardiovascular: Denies chest pain, shortness of breath  Lungs: Denies chest pain, breathing problems, wheezing, pneumonia  Stomach: Denies stomach pain, heartburn, constipation, irritable bowel  Skin: Denies rash, sores, open wounds  Musculoskeletal: left knee replacement  Genitourinary: Denies dysuria, hematuria, polyuria  Gastrointestinal: Denies constipation, obstipation, diarrhea  Neurological: Denies changes in sight, smell, hearing, taste, seizures. Denies loss of consciousness.  Psychiatric:  Denies depression, sleep pattern changes, anxiety, change in personality  Endocrine: Denies mood swings, heat or cold intolerance  Hematologic/Lymphatic: Denies anemia, purpura, petechia  Allergic/Immunologic: Denies swelling of throat, pain or swelling at lymph nodes      Physical Examination:    There were no vitals taken for this visit.     General: AOX3, no apparent distress  Psychiatric: mood and affect appropriate  Lungs: breathing is symmetric and unlabored bilaterally  Heart: regular rate and rhythm  Abdomen: no guarding  Head: normocephalic, atraumatic  Skin: No significant abnormalities, good turgor  Sensation intact to light touch: L1-S1 dermatomes  Muscular exam: 5/5 strength in all major muscle groups unless noted in specialty exam.    Extremities:      Left upper extremity: Full active and passive range of motion without pain, deformity, no open wound, strength 5/5 in all major muscle groups.    Right upper extremity: Full active and passive range of motion without pain, deformity, no open wound, strength 5/5 in all major muscle groups.    Right lower extremity: Full active and passive range of motion without pain, deformity, no open wound, strength 5/5 in all major muscle groups.    Left lower extremity:  Well healed anterior scar is noted.  Patient ambulates with no device and no limp.  No deformity is noted.  Range of motion of the knee is 0-120.   Ligamentous testing of the knee indicates stability of the the MCL and LCL.   Coronal plane stability throughout the range of motion is excellent.   No joint line tenderness to palpation medially or laterally.  Popliteal area is unremarkable.   Negative for effusion. No patellar crepitus.  Patella tracks centrally.  Strength testing is indicative of 5/5 strength at hip flexion, extension, knee flexion and extension, tibialis anterior, EHL, and FHL.  Sensation is intact to light touch in the L1-S1 dermatomes with the exception of the infrapatellar branch  of the saphenous.  Capillary refill is less than 2 seconds distally.    Diagnostics:    Pertinent Xrays:  none      Assessment: Aftercare, status post left total knee arthroplasty    Plan:  Wendy Ali will continue physical therapy exercises until full range of motion and strength are obtained.  We discussed the importance of continued vigilance to this end.  We also discussed dental prophylaxis and safe activities and reasonable life choices regarding activity level.  Patient stated their understanding.  Deep venous thrombosis can be discontinued once activity level is adequate, and pain control for now will be as needed only, and will be patient directed.       Wendy Ali will follow up in 8 months.  Left knee x-rays on follow up.        Wendy Ali has a reminder for a "due or due soon" health maintenance. I have asked that she contact her primary care provider for follow-up on this health maintenance.

## 2020-11-02 ENCOUNTER — Telehealth

## 2020-11-02 ENCOUNTER — Encounter

## 2020-11-02 NOTE — Telephone Encounter (Signed)
-----   Message from [place] sent at 10/24/2020 11:06 AM EDT -----  Subject: Referral Request    QUESTIONS   Reason for referral request? Patient is needing a referral for a GI doctor   so she can get a Colonoscopy done   Has the physician seen you for this condition before? No   Preferred Specialist (if applicable)?   Do you already have an appointment scheduled? No  Additional Information for Provider?   ---------------------------------------------------------------------------  --------------  CALL BACK INFO  What is the best way for the office to contact you? OK to leave message on   voicemail  Preferred Call Back Phone Number? [phone]  ---------------------------------------------------------------------------  --------------  SCRIPT ANSWERS  Relationship to Patient? Self

## 2020-12-09 ENCOUNTER — Encounter

## 2020-12-14 ENCOUNTER — Encounter

## 2021-01-04 ENCOUNTER — Ambulatory Visit: Attending: Internal Medicine | Primary: Internal Medicine

## 2021-01-04 ENCOUNTER — Inpatient Hospital Stay
Admit: 2021-01-04 | Payer: MEDICARE | Attending: Student in an Organized Health Care Education/Training Program | Primary: Internal Medicine

## 2021-01-04 ENCOUNTER — Encounter

## 2021-01-04 ENCOUNTER — Ambulatory Visit: Admit: 2021-01-04 | Discharge: 2021-01-04 | Payer: MEDICARE | Attending: Internal Medicine | Primary: Internal Medicine

## 2021-01-04 DIAGNOSIS — Z1231 Encounter for screening mammogram for malignant neoplasm of breast: Secondary | ICD-10-CM

## 2021-01-04 LAB — RENAL FUNCTION PANEL
Albumin: 4 g/dL (ref 3.5–5.0)
Albumin: 4 g/dL (ref 3.5–5.0)
Anion Gap: 9 mmol/L (ref 5–15)
Anion gap: 9 mmol/L (ref 5–15)
BUN/Creatinine ratio: 19 (ref 12–20)
BUN: 21 MG/DL — ABNORMAL HIGH (ref 6–20)
BUN: 21 MG/DL — ABNORMAL HIGH (ref 6–20)
Bun/Cre Ratio: 19 (ref 12–20)
CO2: 25 mmol/L (ref 21–32)
CO2: 25 mmol/L (ref 21–32)
Calcium: 10.3 MG/DL — ABNORMAL HIGH (ref 8.5–10.1)
Calcium: 10.3 MG/DL — ABNORMAL HIGH (ref 8.5–10.1)
Chloride: 106 mmol/L (ref 97–108)
Chloride: 106 mmol/L (ref 97–108)
Creatinine: 1.12 MG/DL — ABNORMAL HIGH (ref 0.55–1.02)
Creatinine: 1.12 MG/DL — ABNORMAL HIGH (ref 0.55–1.02)
EGFR IF NonAfrican American: 47 mL/min/{1.73_m2} — ABNORMAL LOW (ref >60)
GFR African American: 57 mL/min/{1.73_m2} — ABNORMAL LOW (ref >60)
GFR est AA: 57 mL/min/{1.73_m2} — ABNORMAL LOW (ref >60)
GFR est non-AA: 47 mL/min/{1.73_m2} — ABNORMAL LOW (ref >60)
Glucose: 141 mg/dL — ABNORMAL HIGH (ref 65–100)
Glucose: 141 mg/dL — ABNORMAL HIGH (ref 65–100)
Phosphorus: 3.4 MG/DL (ref 2.6–4.7)
Phosphorus: 3.4 MG/DL (ref 2.6–4.7)
Potassium: 3.9 mmol/L (ref 3.5–5.1)
Potassium: 3.9 mmol/L (ref 3.5–5.1)
Sodium: 140 mmol/L (ref 136–145)
Sodium: 140 mmol/L (ref 136–145)

## 2021-01-04 LAB — VITAMIN D, 25 HYDROXY: Vitamin D 25-Hydroxy: 33.2 ng/mL (ref 30–100)

## 2021-01-04 LAB — PTH INTACT
Calcium: 10.2 MG/DL — ABNORMAL HIGH (ref 8.5–10.1)
PTH, Intact: 63 pg/mL (ref 18.4–88.0)

## 2021-01-04 LAB — PTH, INTACT
Calcium: 10.2 MG/DL — ABNORMAL HIGH (ref 8.5–10.1)
PTH: 63 pg/mL (ref 18.4–88.0)

## 2021-01-04 LAB — VITAMIN D 25 HYDROXY: Vit D, 25-Hydroxy: 33.2 ng/mL (ref 30–100)

## 2021-01-04 NOTE — Progress Notes (Signed)
Reviewed by radiologist

## 2021-01-04 NOTE — Progress Notes (Signed)
Left voice mail, waiting for call back  Her Ca level was 10.3 her PTH 63, her Vitamin D was 33.2 and her 1,25-OH Vitamin D was 29.6.  Will continue to monitor.

## 2021-01-04 NOTE — Progress Notes (Signed)
Chief Complaint   Patient presents with   ??? Elevated Blood Calcium     pcp and pharamcy verified     History of Present Illness: Wendy Ali is a 80 y.o. female here for follow up of  hypercalcemia and  hyperparathyroidism.    Pt presented to Dr. Leonor Liv in December 2020 for a new patient visit. At the time her Ca was 10.6 and her Vitamin D was 10.8 and her TSH was 1.29. Her renal function was good with Cr of 0.91.Dr. Leonor Liv repeated her Ca in February 2021 and at that time her Ca had declined to 10.3, but her PTH was 99.6.   She had a DXA scan and it did not show any evidence of osteoporosis or osteopenia.  She was started on Vitamin D 1000 units daily, which she had been taking for 6+ months.    At our initial visit in August 2021 I repeated her Vitamin D level which had improved to 36.3. Her 1,25-OH vitamin D was normal at 31.3 and her PTH had improved down to a normal range at 61.  Her Ca was 10.5.   Since her Ca level was under 11.2 and she is not having any symptoms or complications from the high calcium, we agreed to monitor her levels.    The records from her prior PCP in regards to her thyroid nodule stated that pt has prior thyroid nodule with FNA in October 2015 that was read as benign. She had a repeat US in November 2016 which showed no change in thyroid nodule from 2015.  She had right thyroid nodule measuring 1.8 x 1.8 x 1.1 cm and a left sided thyroid nodule measuring 4.7 x 2.6 x 2.9 cm, which was previously biopsied and read as benign.    At our last visit in December 2021 her PTH was down to 42 as her Vitamin D had improved to 44. Her Ca was 11.0, but when we repeated it in February 2022 the Ca had decreased to 10.6 and it was down to 10.4 in March 2022.    I repeated her Thyroid US and it showed a left sided 1.6 x 1.5 x 1.2 cm isoechoic rounded avascular nodule centrally.  As well as a right sided 2.6 x 1.8 x 1.2 cm echogenic slightly heterogeneous irregular.   I send her for an FNA of both  nodules and they came back benign.    Pt denies any recent illnesses, injuries or hospitalizations.      Pt is still taking the Valsartan-HCTZ 80/12.17m daily.  She had LEFT TOTAL KNEE ARTHROPLASTY on 06/13/20.  Prior to her surgery her Ca were 10.1 and 8.9.    She has completed PT and notes that "the knee is doing well."    Pt is still taking the vitamin D 1000 units daily.    She denies renal stones, hematuria or dysuria.  No fall or fractures since our last visit and no hx of osteoporosis.  She denies issues of AMS, confusion or mood swings.  She denies any Ca our TUMS or any treatments for GERD.    Current Outpatient Medications   Medication Sig   ??? metFORMIN (GLUCOPHAGE) 1,000 mg tablet Take 1 Tablet by mouth two (2) times daily (with meals).   ??? simvastatin (ZOCOR) 20 mg tablet TAKE 1 TABLET BY MOUTH AT  NIGHT FOR HIGH CHOLESTEROL   ??? valsartan-hydroCHLOROthiazide (DIOVAN-HCT) 80-12.5 mg per tablet Take 1 Tablet by mouth daily. FOR HIGH BLOOD PRESSURE   ???  b complex vitamins (B Complex-Vitamin B12) tablet Take 1,000 mcg by mouth daily.   ??? cholecalciferol (Vitamin D3) 25 mcg (1,000 unit) cap Take 1,000 Units by mouth daily. TAKES 500 MCG DAILY   ??? cyanocobalamin 1,000 mcg tablet Take 1,000 mcg by mouth daily.   ??? ascorbic acid, vitamin C, (Vitamin C) 500 mg tablet Take 500 mg by mouth daily.   ??? vitamin E (AQUA GEMS) 400 unit capsule Take 400 Units by mouth daily.   ??? cpap machine kit by Does Not Apply route.     No current facility-administered medications for this visit.     No Known Allergies  Review of Systems:  - Cardiovascular: no chest pain  - Neurological: no tremors  - Integumentary: skin is normal    Physical Examination:  Blood pressure 123/66, pulse 85, height 5' 4"  (1.626 m), weight 176 lb (79.8 kg).  - General: pleasant, no distress, good eye contact   - Neck: no thyromegaly or thyroid bruits  - Cardiovascular: regular, normal rate, nl s1 and s2, no m/r/g   - Integumentary: skin is normal, no  edema  - Neurological: reflexes 2+ at biceps, no tremors  - Psychiatric: normal mood and affect    Data Reviewed:   Component      Latest Ref Rng & Units 09/13/2020           8:10 AM   Sodium      136 - 145 mmol/L 135 (L)   Potassium      3.5 - 5.1 mmol/L 4.1   Chloride      97 - 108 mmol/L 104   CO2      21 - 32 mmol/L 26   Anion gap      5 - 15 mmol/L 5   Glucose      65 - 100 mg/dL 117 (H)   BUN      6 - 20 MG/DL 22 (H)   Creatinine      0.55 - 1.02 MG/DL 0.91   BUN/Creatinine ratio      12 - 20   24 (H)   GFR est AA      >60 ml/min/1.48m >60   GFR est non-AA      >60 ml/min/1.738m59 (L)   Calcium      8.5 - 10.1 MG/DL 10.4 (H)   Bilirubin, total      0.2 - 1.0 MG/DL 0.3   ALT      12 - 78 U/L 15   AST      15 - 37 U/L 17   Alk. phosphatase      45 - 117 U/L 83   Protein, total      6.4 - 8.2 g/dL 7.5   Albumin      3.5 - 5.0 g/dL 3.9   Globulin      2.0 - 4.0 g/dL 3.6   A-G Ratio      1.1 - 2.2   1.1       Assessment/Plan:   1) Hypercalcemia > As her Vitamin D improved, her PTH decreased and her Ca levels have also decreased.  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 Furthermore she is still taking HCTZ and that will cause her to hold on to excess calcium. Her Ca in March 2022 was down to 10.4. I will order a renal panel, PTH, Vitamin D and 1,25-OH Vitamin D. Pt is not having any complications from the high Ca so I do not feel  that there is anything that needs to be done at this time.  Will continue to monitor her levels.    2) Hyperparathyroidism > See #1.    3) Hypovitaminosis D > Pt to continue the Vitamin D 1000 units daily for now and I will order a Vitamin D level today.    4) Thyroid Nodule > She had benign FNAs of the dominate right and dominate left thyroid nodules in March 2022. She will need a repeat Thyroid US in February 2023.    Pt voices understanding and agreement with the plan.  Pain noted and pt was recommended to call her PCP for further evaluation and treatment, as needed    RTC  February 2023  Follow-up and Dispositions    ?? Return in about 8 months (around 09/06/2021).         Copy sent to:  Dr. Felicie Morn

## 2021-01-05 LAB — VITAMIN D, 1, 25 DIHYDROXY: Calcitriol (Vit D 1, 25 di-OH): 29.6 pg/mL (ref 24.8–81.5)

## 2021-01-05 LAB — VITAMIN D 1,25 DIHYDROXY: Calcitriol: 29.6 pg/mL (ref 24.8–81.5)

## 2021-02-23 ENCOUNTER — Encounter

## 2021-02-24 MED ORDER — VALSARTAN-HYDROCHLOROTHIAZIDE 80 MG-12.5 MG TAB
ORAL_TABLET | Freq: Every day | ORAL | 3 refills | Status: DC
Start: 2021-02-24 — End: 2021-03-29

## 2021-02-24 MED ORDER — SIMVASTATIN 20 MG TAB
20 mg | ORAL_TABLET | ORAL | 3 refills | Status: DC
Start: 2021-02-24 — End: 2021-03-29

## 2021-03-29 ENCOUNTER — Telehealth: Attending: Internal Medicine | Primary: Internal Medicine

## 2021-03-29 ENCOUNTER — Encounter: Admit: 2021-03-29 | Payer: MEDICARE | Attending: Internal Medicine | Primary: Internal Medicine

## 2021-03-29 DIAGNOSIS — R059 Cough, unspecified: Secondary | ICD-10-CM

## 2021-03-29 MED ORDER — SIMVASTATIN 20 MG TAB
20 mg | ORAL_TABLET | Freq: Every evening | ORAL | 3 refills | Status: AC
Start: 2021-03-29 — End: ?

## 2021-03-29 MED ORDER — BENZONATATE 100 MG CAP
100 mg | ORAL_CAPSULE | Freq: Three times a day (TID) | ORAL | 0 refills | Status: AC | PRN
Start: 2021-03-29 — End: 2021-04-05

## 2021-03-29 MED ORDER — METFORMIN 1,000 MG TAB
1000 mg | ORAL_TABLET | Freq: Two times a day (BID) | ORAL | 1 refills | Status: AC
Start: 2021-03-29 — End: ?

## 2021-03-29 MED ORDER — VALSARTAN-HYDROCHLOROTHIAZIDE 80 MG-12.5 MG TAB
ORAL_TABLET | Freq: Every day | ORAL | 3 refills | Status: AC
Start: 2021-03-29 — End: ?

## 2021-03-29 NOTE — Progress Notes (Signed)
 Chief Complaint   Patient presents with    Cough     Dry cough x 2 weeks / cough drops   /  Am blood sugar 110     1. Have you been to the ER, urgent care clinic since your last visit?  Hospitalized since your last visit?     2. Have you seen or consulted any other health care providers outside of the [facility] since your last visit?      3. For patients aged 82-75: Has the patient had a colonoscopy / FIT/ Cologuard? Yes - no Care Gap present NOV 2022 AT [facility]       If the patient is female:    4. For patients aged 52-74: Has the patient had a mammogram within the past 2 years? Yes - no Care Gap presentdONE JUNE 2022       5. For patients aged 21-65: Has the patient had a pap smear? NA - based on age or sex

## 2021-03-29 NOTE — Progress Notes (Signed)
Chief Complaint   Patient presents with    Cough     Dry cough x 2 weeks / cough drops   /  Am blood sugar 110     HPI:  Wendy Ali is a 80 y.o. female who was seen by synchronous (real-time) audio-video technology on 03/29/2021 for Cough (Dry cough x 2 weeks / cough drops   /  Am blood sugar 110)    Assessment & Plan:   Diagnoses and all orders for this visit:    1. Cough  -     benzonatate (Tessalon Perles) 100 mg capsule; Take 1 Capsule by mouth three (3) times daily as needed for Cough for up to 7 days.    2. Hypercholesterolemia  -     simvastatin (ZOCOR) 20 mg tablet; Take 1 Tablet by mouth nightly. TAKE 1 TABLET BY MOUTH AT  NIGHT FOR HIGH CHOLESTEROL    3. Hypertension, well controlled  -     valsartan-hydroCHLOROthiazide (DIOVAN-HCT) 80-12.5 mg per tablet; Take 1 Tablet by mouth daily. FOR HIGH BLOOD PRESSURE    4. Controlled type 2 diabetes mellitus with diabetic nephropathy, without long-term current use of insulin (HCC)  -     metFORMIN (GLUCOPHAGE) 1,000 mg tablet; Take 1 Tablet by mouth two (2) times daily (with meals).      I spent at least 20 minutes on this visit with this established patient.  712  Subjective:   Wendy Ali is a 80 y.o. AA female with h/o hypertension, hypercholesterolemia, diabetes type 2 is seen for follow up.  Patient is requesting medication refills.   She has c/o dry cough, no fever/chills, wheezing, sob. Symptom ongoing for 2 weeks.    Prior to Admission medications    Medication Sig Start Date End Date Taking? Authorizing Provider   valsartan-hydroCHLOROthiazide (DIOVAN-HCT) 80-12.5 mg per tablet TAKE 1 TABLET BY MOUTH  DAILY FOR HIGH BLOOD  PRESSURE 02/24/21   Zael Shuman, Carlye Grippe, MD   simvastatin (ZOCOR) 20 mg tablet TAKE 1 TABLET BY MOUTH AT  NIGHT FOR HIGH CHOLESTEROL 02/24/21   Cordelle Dahmen, Carlye Grippe, MD   metFORMIN (GLUCOPHAGE) 1,000 mg tablet Take 1 Tablet by mouth two (2) times daily (with meals). 09/12/20   Damarious Holtsclaw, Carlye Grippe, MD   b complex vitamins (B Complex-Vitamin B12) tablet  Take 1,000 mcg by mouth daily.    Provider, Historical   cholecalciferol (Vitamin D3) 25 mcg (1,000 unit) cap Take 1,000 Units by mouth daily. TAKES 500 MCG DAILY    Provider, Historical   cyanocobalamin 1,000 mcg tablet Take 1,000 mcg by mouth daily.    Provider, Historical   ascorbic acid, vitamin C, (Vitamin C) 500 mg tablet Take 500 mg by mouth daily.    Provider, Historical   vitamin E (AQUA GEMS) 400 unit capsule Take 400 Units by mouth daily.    Provider, Historical   cpap machine kit by Does Not Apply route.    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[place]) 05/20/2020    Aftercare following right knee joint replacement surgery 11/18/2019    Essential hypertension 09/07/2019    Hyperlipidemia 09/07/2019    Obstructive sleep apnea 09/07/2019    Unilateral primary osteoarthritis, left knee 09/07/2019    Type 2 diabetes mellitus without complication ([place]) 57/84/6962     Current Outpatient Medications   Medication Sig Dispense Refill  valsartan-hydroCHLOROthiazide (DIOVAN-HCT) 80-12.5 mg per tablet TAKE 1 TABLET BY MOUTH  DAILY FOR HIGH BLOOD  PRESSURE 90 Tablet 3    simvastatin (ZOCOR) 20 mg tablet TAKE 1 TABLET BY MOUTH AT  NIGHT FOR HIGH CHOLESTEROL 90 Tablet 3    metFORMIN (GLUCOPHAGE) 1,000 mg tablet Take 1 Tablet by mouth two (2) times daily (with meals). 180 Tablet 1    b complex vitamins (B Complex-Vitamin B12) tablet Take 1,000 mcg by mouth daily.      cholecalciferol (Vitamin D3) 25 mcg (1,000 unit) cap Take 1,000 Units by mouth daily. TAKES 500 MCG DAILY      cyanocobalamin 1,000 mcg tablet Take 1,000 mcg by mouth daily.      ascorbic acid, vitamin C, (Vitamin C) 500 mg tablet Take 500 mg by mouth daily.      vitamin E (AQUA GEMS) 400 unit capsule Take 400 Units by mouth daily.      cpap machine kit by Does Not Apply route.       No Known Allergies  Past Medical History:   Diagnosis Date    Arthritis      Diabetes ([place])     Hypercalcemia     Hypercholesterolemia     Hyperparathyroidism ([place])     Hypertension     Menopause     Sleep apnea     cpap    Vertigo     past hx     Past Surgical History:   Procedure Laterality Date    HX BREAST BIOPSY Left 1984    benign    HX KNEE REPLACEMENT      HX OTHER SURGICAL      colonoscopy    PR TOTAL KNEE ARTHROPLASTY Right 2002     Family History   Problem Relation Age of Onset    No Known Problems Mother         murdered    Stroke Father     Hypertension Father     Diabetes Sister     Hypertension Sister     Hypertension Brother     Heart defect Brother         defibrilattor    Diabetes Brother     Heart Disease Brother         has pacemaker    Ovarian Cancer Niece     Cancer Niece         x2    No Known Problems Maternal Grandmother     No Known Problems Maternal Grandfather     No Known Problems Paternal Grandmother     No Known Problems Paternal Grandfather      Social History     Tobacco Use    Smoking status: Never    Smokeless tobacco: Never   Substance Use Topics    Alcohol use: Yes     Alcohol/week: 1.0 standard drink     Types: 1 Glasses of wine per week     Comment: once a month     ROS:  As per hpi    Objective:     Patient-Reported Vitals 03/29/2021   Patient-Reported Temperature 96.9   Patient-Reported Systolic  366   Patient-Reported Diastolic 66      General: alert, cooperative, no distress   Mental  status: normal mood, behavior, speech, dress, motor activity, and thought processes, able to follow commands   HENT: NCAT   Neck: no visualized mass   Resp: no respiratory distress   Neuro: no gross deficits  Skin: no discoloration or lesions of concern on visible areas   Psychiatric: normal affect, consistent with stated mood, no evidence of hallucinations     Additional exam findings:   We discussed the expected course, resolution and complications of the diagnosis(es) in detail.  Medication risks, benefits, costs, interactions, and alternatives were discussed as  indicated.  I advised her to contact the office if her condition worsens, changes or fails to improve as anticipated. She expressed understanding with the diagnosis(es) and plan.     Wendy Ali, was evaluated through a synchronous (real-time) audio-video encounter. The patient (or guardian if applicable) is aware that this is a billable service, which includes applicable co-pays. This Virtual Visit was conducted with patient's (and/or legal guardian's) consent. The visit was conducted pursuant to the emergency declaration under the [place], [place] waiver authority and the [employer] Act.  Patient identification was verified, and a caregiver was present when appropriate.  The patient was located at: Home: [address]  [place] [address]  The provider was located at: Facility (Appt Department): [address]  [address]  [place] [address]        [name], MD

## 2021-03-30 NOTE — Telephone Encounter (Signed)
Patient hasn't seen Sleep Medicine for CPAP since moving to [place] in 2018. She said that her PCP in [place] used to get reading from CPAP and adjust settings for machine & equipment. She needs someone in [place] to be able to do this.

## 2021-06-07 ENCOUNTER — Encounter

## 2021-06-13 ENCOUNTER — Ambulatory Visit
Admit: 2021-06-13 | Discharge: 2021-06-13 | Payer: MEDICARE | Attending: Adult Reconstructive Orthopaedic Surgery | Primary: Internal Medicine

## 2021-06-13 ENCOUNTER — Inpatient Hospital Stay: Admit: 2021-06-13 | Payer: MEDICARE | Primary: Internal Medicine

## 2021-06-13 DIAGNOSIS — Z4789 Encounter for other orthopedic aftercare: Secondary | ICD-10-CM

## 2021-06-13 DIAGNOSIS — Z471 Aftercare following joint replacement surgery: Secondary | ICD-10-CM

## 2021-06-13 NOTE — Progress Notes (Signed)
 Identified pt with two pt identifiers (name and DOB). Reviewed chart in preparation for visit and have obtained necessary documentation.  Wendy Ali is a 80 y.o. female  Chief Complaint   Patient presents with    Surgical Follow-up     LT Knee     Visit Vitals  BP (!) 149/76 (BP 1 Location: Right arm, BP Patient Position: Sitting, BP Cuff Size: Large adult) Comment: Pt was frustrated this AM   Pulse 82   Temp 97 F (36.1 C) (Tympanic)   Ht 5' 4 (1.626 m)   Wt 180 lb (81.6 kg)   SpO2 98%   BMI 30.90 kg/m     1. Have you been to the ER, urgent care clinic since your last visit?  Hospitalized since your last visit?No    2. Have you seen or consulted any other health care providers outside of the [facility] since your last visit?  Include any pap smears or colon screening. No

## 2021-06-13 NOTE — Progress Notes (Signed)
06/13/2021    Chief Complaint: Follow up for left knee replacement    HPI: Ms.Alms is a 80 y.o.  female who is now one year status post left total knee arthroplasty.  The patient states that they are doing well.  The preoperative pain is gone and the postoperative pain is gone.  The pain is minimal in intensity.  Regular exercises have helped with residual symptoms.        Past Medical History:   Diagnosis Date    Arthritis     Diabetes ([place])     Hypercalcemia     Hypercholesterolemia     Hyperparathyroidism ([place])     Hypertension     Menopause     Sleep apnea     cpap    Vertigo     past hx       Past Surgical History:   Procedure Laterality Date    HX BREAST BIOPSY Left 1984    benign    HX KNEE REPLACEMENT      HX OTHER SURGICAL      colonoscopy    PR TOTAL KNEE ARTHROPLASTY Right 2002       Current Outpatient Medications on File Prior to Visit   Medication Sig Dispense Refill    simvastatin (ZOCOR) 20 mg tablet Take 1 Tablet by mouth nightly. TAKE 1 TABLET BY MOUTH AT  NIGHT FOR HIGH CHOLESTEROL 90 Tablet 3    valsartan-hydroCHLOROthiazide (DIOVAN-HCT) 80-12.5 mg per tablet Take 1 Tablet by mouth daily. FOR HIGH BLOOD PRESSURE 90 Tablet 3    metFORMIN (GLUCOPHAGE) 1,000 mg tablet Take 1 Tablet by mouth two (2) times daily (with meals). 180 Tablet 1    b complex vitamins tablet Take 1,000 mcg by mouth daily.      cholecalciferol (VITAMIN D3) 25 mcg (1,000 unit) cap Take 1,000 Units by mouth daily. TAKES 500 MCG DAILY      cyanocobalamin 1,000 mcg tablet Take 1,000 mcg by mouth daily.      ascorbic acid, vitamin C, (VITAMIN C) 500 mg tablet Take 500 mg by mouth daily.      vitamin E (AQUA GEMS) 400 unit capsule Take 400 Units by mouth daily.      cpap machine kit by Does Not Apply route.       No current facility-administered medications on file prior to visit.       No Known Allergies    Family History   Problem Relation Age of Onset    No Known Problems Mother         murdered    Stroke Father     Hypertension  Father     Diabetes Sister     Hypertension Sister     Hypertension Brother     Heart defect Brother         defibrilattor    Diabetes Brother     Heart Disease Brother         has pacemaker    Ovarian Cancer Niece     Cancer Niece         x2    No Known Problems Maternal Grandmother     No Known Problems Maternal Grandfather     No Known Problems Paternal Grandmother     No Known Problems Paternal Grandfather        Social History     Socioeconomic History    Marital status: MARRIED   Tobacco Use    Smoking status: Never  Smokeless tobacco: Never   Vaping Use    Vaping Use: Never used   Substance and Sexual Activity    Alcohol use: Yes     Alcohol/week: 1.0 standard drink     Types: 1 Glasses of wine per week     Comment: once a month    Drug use: Never    Sexual activity: Not Currently         Review of Systems:       General: Denies headache, lethargy, fever, weight loss  Ears/Nose/Throat: Denies ear discharge, drainage, nosebleeds, hoarse voice, dental problems  Cardiovascular: Denies chest pain, shortness of breath  Lungs: Denies chest pain, breathing problems, wheezing, pneumonia  Stomach: Denies stomach pain, heartburn, constipation, irritable bowel  Skin: Denies rash, sores, open wounds  Musculoskeletal: no issues  Genitourinary: Denies dysuria, hematuria, polyuria  Gastrointestinal: Denies constipation, obstipation, diarrhea  Neurological: Denies changes in sight, smell, hearing, taste, seizures. Denies loss of consciousness.  Psychiatric: Denies depression, sleep pattern changes, anxiety, change in personality  Endocrine: Denies mood swings, heat or cold intolerance  Hematologic/Lymphatic: Denies anemia, purpura, petechia  Allergic/Immunologic: Denies swelling of throat, pain or swelling at lymph nodes      Physical Examination:    Visit Vitals  BP (!) 149/76 (BP 1 Location: Right arm, BP Patient Position: Sitting, BP Cuff Size: Large adult)   Pulse 82   Temp 97 ??F (36.1 ??C) (Tympanic)   Ht 5' 4"  (1.626  m)   Wt 180 lb (81.6 kg)   SpO2 98%   BMI 30.90 kg/m??        General: AOX3, no apparent distress  Psychiatric: mood and affect appropriate  Lungs: breathing is symmetric and unlabored bilaterally  Heart: regular rate and rhythm  Abdomen: no guarding  Head: normocephalic, atraumatic  Skin: No significant abnormalities, good turgor  Sensation intact to light touch: L1-S1 dermatomes  Muscular exam: 5/5 strength in all major muscle groups unless noted in specialty exam.    Extremities:      Left upper extremity: Full active and passive range of motion without pain, deformity, no open wound, strength 5/5 in all major muscle groups.    Right upper extremity: Full active and passive range of motion without pain, deformity, no open wound, strength 5/5 in all major muscle groups.    Right lower extremity: Full active and passive range of motion without pain, deformity, no open wound, strength 5/5 in all major muscle groups.    Left lower extremity:  Well healed anterior scar is noted.  Patient ambulates with no device and no limp.  No deformity is noted.  Range of motion of the knee is 0-120.   Ligamentous testing of the knee indicates stability of the the MCL and LCL.   Coronal plane stability throughout the range of motion is excellent.   No joint line tenderness to palpation medially or laterally.  Popliteal area is unremarkable.   Negative for effusion. No patellar crepitus.  Patella tracks centrally.  Strength testing is indicative of 5/5 strength at hip flexion, extension, knee flexion and extension, tibialis anterior, EHL, and FHL.  Sensation is intact to light touch in the L1-S1 dermatomes with the exception of the infrapatellar branch of the saphenous.  Capillary refill is less than 2 seconds distally.    Diagnostics:    Pertinent Xrays:     Xrays of the left knee indicate anatomic positioning left knee replacement, otherwise, indicate no fractures, osseus lesions, abnormalities, cartilage space is well maintained.  Overall alignment is within normal limits, no effusion or other soft tissue abnormality.        Assessment: Aftercare, status post left total knee arthroplasty    Plan:  Ms.Wilborn will continue physical therapy exercises until full range of motion and strength are obtained.  We discussed the importance of continued vigilance to this end.  We also discussed dental prophylaxis and safe activities and reasonable life choices regarding activity level.  Patient stated their understanding.  Deep venous thrombosis can be discontinued once activity level is adequate, and pain control for now will be as needed only, and will be patient directed.       Ms.Holan will follow up in 2 years.  Left knee x-rays on follow up.        Ms. Kunz has a reminder for a "due or due soon" health maintenance. I have asked that she contact her primary care provider for follow-up on this health maintenance.

## 2021-06-16 ENCOUNTER — Encounter

## 2021-06-30 NOTE — Telephone Encounter (Signed)
Spoke with patient. She said that she's not feeling well. Having nausea & vomiting. Gave patient # to [facility] to call for home visit.

## 2021-06-30 NOTE — Telephone Encounter (Signed)
Visiting Nurse Practitioner  Neal Dy in visiting pt and states she would like pt to be seen today for Nausea and pt is having irregular heart rhythm upon her exam hat is new according to pt. Warm transfer to [facility] at Dr Leonor Liv office.

## 2021-06-30 NOTE — Telephone Encounter (Signed)
Pls call patient per insurance NP that was there [phone]     Pt not feeling well, nausea, vomitting     NP had an emergency and had to leave

## 2021-08-01 ENCOUNTER — Ambulatory Visit: Payer: MEDICARE | Attending: Internal Medicine | Primary: Internal Medicine

## 2021-08-16 ENCOUNTER — Inpatient Hospital Stay: Admit: 2021-08-16 | Payer: MEDICARE | Attending: Internal Medicine | Primary: Internal Medicine

## 2021-08-16 ENCOUNTER — Encounter

## 2021-08-16 DIAGNOSIS — E042 Nontoxic multinodular goiter: Secondary | ICD-10-CM

## 2021-08-17 LAB — TSH 3RD GENERATION
TSH: 1.3 u[IU]/mL (ref 0.450–4.500)
TSH: 1.3 u[IU]/mL (ref 0.450–4.500)

## 2021-08-17 LAB — RENAL FUNCTION PANEL
Albumin: 4.6 g/dL (ref 3.6–4.6)
Albumin: 4.6 g/dL (ref 3.6–4.6)
BUN/Creatinine ratio: 15 (ref 12–28)
BUN: 14 mg/dL (ref 8–27)
BUN: 14 mg/dL (ref 8–27)
Bun/Cre Ratio: 15 NA (ref 12–28)
CO2: 25 mmol/L (ref 20–29)
CO2: 25 mmol/L (ref 20–29)
Calcium: 10.8 mg/dL — ABNORMAL HIGH (ref 8.7–10.3)
Calcium: 10.8 mg/dL — ABNORMAL HIGH (ref 8.7–10.3)
Chloride: 103 mmol/L (ref 96–106)
Chloride: 103 mmol/L (ref 96–106)
Creatinine: 0.92 mg/dL (ref 0.57–1.00)
Creatinine: 0.92 mg/dL (ref 0.57–1.00)
Est, Glomerular Filtration Rate: 62 mL/min/{1.73_m2} (ref >59)
Glucose: 118 mg/dL — ABNORMAL HIGH (ref 70–99)
Glucose: 118 mg/dL — ABNORMAL HIGH (ref 70–99)
Phosphorus: 3.5 mg/dL (ref 3.0–4.3)
Phosphorus: 3.5 mg/dL (ref 3.0–4.3)
Potassium: 4.5 mmol/L (ref 3.5–5.2)
Potassium: 4.5 mmol/L (ref 3.5–5.2)
Sodium: 143 mmol/L (ref 134–144)
Sodium: 143 mmol/L (ref 134–144)
eGFR: 62 mL/min/{1.73_m2} (ref >59)

## 2021-08-17 LAB — PTH INTACT: PTH, Intact: 39 pg/mL (ref 15–65)

## 2021-08-17 LAB — VITAMIN D, 25 HYDROXY: VITAMIN D, 25-HYDROXY: 45.8 ng/mL (ref 30.0–100.0)

## 2021-08-17 LAB — VITAMIN D, 1, 25 DIHYDROXY: Calcitriol (Vit D 1, 25 di-OH): 31.8 pg/mL (ref 24.8–81.5)

## 2021-08-17 LAB — VITAMIN D 1,25 DIHYDROXY: Calcitriol: 31.8 pg/mL (ref 24.8–81.5)

## 2021-08-17 LAB — VITAMIN D 25 HYDROXY: Vit D, 25-Hydroxy: 45.8 ng/mL (ref 30.0–100.0)

## 2021-08-17 LAB — PTH, INTACT: PTH: 39 pg/mL (ref 15–65)

## 2021-09-06 ENCOUNTER — Ambulatory Visit: Attending: Internal Medicine | Primary: Emergency Medicine

## 2021-09-06 ENCOUNTER — Ambulatory Visit: Admit: 2021-09-06 | Discharge: 2021-09-06 | Payer: MEDICARE | Attending: Internal Medicine | Primary: Internal Medicine

## 2021-09-06 NOTE — Progress Notes (Signed)
Chief Complaint   Patient presents with    Elevated Blood Calcium     Pcp and pharmacy verified     History of Present Illness: Wendy Ali is a 81 y.o. female here for follow up of  hypercalcemia and  hyperparathyroidism.    Pt presented to Dr. Leonor Liv in December 2020 for a new patient visit. At the time her Ca was 10.6 and her Vitamin D was 10.8 and her TSH was 1.29. Her renal function was good with Cr of 0.91.Dr. Leonor Liv repeated her Ca in February 2021 and at that time her Ca had declined to 10.3, but her PTH was 99.6.   She had a DXA scan and it did not show any evidence of osteoporosis or osteopenia.  She was started on Vitamin D 1000 units daily, which she had been taking for 6+ months.    At our initial visit in August 2021 I repeated her Vitamin D level which had improved to 36.3. Her 1,25-OH vitamin D was normal at 31.3 and her PTH had improved down to a normal range at 61.  Her Ca was 10.5.   Since her Ca level was under 11.2 and she is not having any symptoms or complications from the high calcium, we agreed to monitor her levels.    The records from her prior PCP in regards to her thyroid nodule stated that pt has prior thyroid nodule with FNA in October 2015 that was read as benign. She had a repeat US in November 2016 which showed no change in thyroid nodule from 2015.  She had right thyroid nodule measuring 1.8 x 1.8 x 1.1 cm and a left sided thyroid nodule measuring 4.7 x 2.6 x 2.9 cm, which was previously biopsied and read as benign.    At our last visit in December 2021 her PTH was down to 42 as her Vitamin D had improved to 44. Her Ca was 11.0, but when we repeated it in February 2022 the Ca had decreased to 10.6 and it was down to 10.4 in March 2022.    I repeated her Thyroid US and it showed a left sided 1.6 x 1.5 x 1.2 cm isoechoic rounded avascular nodule centrally.  As well as a right sided 2.6 x 1.8 x 1.2 cm echogenic slightly heterogeneous irregular.   I send her for an FNA of both  nodules and they came back benign.    Pt denies any recent illnesses, injuries or hospitalizations.    Pt is still taking the Valsartan-HCTZ 80/12.56m daily.    Her Ca last week was 10.8 with PTH 39 and her 1,25-OH Vitamin D was not elevated 31.8.    Pt is still taking the vitamin D 1000 units daily.    She denies renal stones, hematuria or dysuria.  No fall or fractures since our last visit and no hx of osteoporosis.  She denies issues of AMS, confusion or mood swings.  She denies any Ca our TUMS or any treatments for GERD.    Pt denies issues of dysphagia, dysphonia, voice changes or hoarseness in her voice.  She denies any changes in her neck or throat.    Current Outpatient Medications   Medication Sig    aspirin 81 mg cap aspirin 81 mg capsule   Take 1 capsule every day by oral route.    simvastatin (ZOCOR) 20 mg tablet Take 1 Tablet by mouth nightly. TAKE 1 TABLET BY MOUTH AT  NIGHT FOR HIGH CHOLESTEROL  valsartan-hydroCHLOROthiazide (DIOVAN-HCT) 80-12.5 mg per tablet Take 1 Tablet by mouth daily. FOR HIGH BLOOD PRESSURE    metFORMIN (GLUCOPHAGE) 1,000 mg tablet Take 1 Tablet by mouth two (2) times daily (with meals).    b complex vitamins tablet Take 1,000 mcg by mouth daily.    cholecalciferol (VITAMIN D3) 25 mcg (1,000 unit) cap Take 1,000 Units by mouth daily. TAKES 500 MCG DAILY    cyanocobalamin 1,000 mcg tablet Take 1,000 mcg by mouth daily.    ascorbic acid, vitamin C, (VITAMIN C) 500 mg tablet Take 500 mg by mouth daily.    vitamin E (AQUA GEMS) 400 unit capsule Take 400 Units by mouth daily.    cpap machine kit by Does Not Apply route.     No current facility-administered medications for this visit.     No Known Allergies  Review of Systems:  - Cardiovascular: no chest pain  - Neurological: no tremors  - Integumentary: skin is normal    Physical Examination:  Blood pressure 126/71, pulse 77, height _0  (1.626 m), weight 176 lb 12.8 oz (80.2 kg).  General: pleasant, no distress, good eye contact    Neck: no thyromegaly or thyroid bruits  Cardiovascular: regular, normal rate, nl s1 and s2, no m/r/g   Integumentary: skin is normal, no edema  Neurological: reflexes 2+ at biceps, no tremors  Psychiatric: normal mood and affect    Data Reviewed:   Component      Latest Ref Rng & Units 08/16/2021   Glucose      70 - 99 mg/dL 118 (H)   BUN      8 - 27 mg/dL 14   Creatinine      0.57 - 1.00 mg/dL 0.92   eGFR      >59 mL/min/1.73 62   BUN/Creatinine ratio      12 - 28 15   Sodium      134 - 144 mmol/L 143   Potassium      3.5 - 5.2 mmol/L 4.5   Chloride      96 - 106 mmol/L 103   CO2      20 - 29 mmol/L 25   Calcium      8.7 - 10.3 mg/dL 10.8 (H)   Phosphorus      3.0 - 4.3 mg/dL 3.5   Albumin      3.6 - 4.6 g/dL 4.6   VITAMIN D, 25-HYDROXY      30.0 - 100.0 ng/mL 45.8   Calcitriol (Vit D 1, 25 di-OH)      24.8 - 81.5 pg/mL 31.8   PTH, Intact      15 - 65 pg/mL 39   TSH      0.450 - 4.500 uIU/mL 1.300     Narrative & Impression   EXAM: US THYROID/PARATHYROID/SOFT TISS      INDICATION: Thyroid nodule.     COMPARISON: 08/24/2020.     TECHNIQUE: Real-time sonography of the thyroid gland was performed with a high  frequency linear transducer. Multiple static images were obtained.     FINDINGS:  Mildly heterogeneous thyroid gland with bilateral solid nodules. Largest nodule  in the right lobe measures 2.7 x 1.8 x 1.2 cm, stable. Cluster of nodules in the  left lobe measures 4.4 x 3.6 x 2.3 cm.     The right lobe measures 4.6 x 1.6 x 1.3 cm and the left lobe measures 6.0 x 2.2  x 1.9 cm. The isthmus measures 0.5  cm.     IMPRESSION  Stable multinodular thyroid gland.       Assessment/Plan:   1) Hypercalcemia > As her Vitamin D improved, her PTH decreased and her Ca levels have also decreased.  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 Furthermore she is still taking HCTZ and that will cause her to hold on to excess calcium. Her Ca in February 2023 was 10.8, her PTH was normal at 43, her 1,25-OH Vitamin D was not  elevated 31.8, her Vitamin D was normal at 45.8. These again point to normal parathyroid function and a the high Ca due to her HCTZ use.Marland Kitchen    2) Hyperparathyroidism > See #1.    3) Hypovitaminosis D > Pt to continue the Vitamin D 1000 units daily. Her Vitamin D in February 2023 was 45.8.     4) Thyroid Nodule > She had benign FNAs of the dominate right and dominate left thyroid nodules in March 2022. Her repeat thyroid US in February 2023 was unchanged from February 2022.      Pt voices understanding and agreement with the plan.  Pain noted and pt was recommended to call her PCP for further evaluation and treatment, as needed    RTC February 2024        Copy sent to:  Dr. Felicie Morn

## 2021-09-08 ENCOUNTER — Inpatient Hospital Stay: Payer: MEDICARE

## 2021-09-08 MED ORDER — MIDAZOLAM 1 MG/ML IJ SOLN
1 mg/mL | INTRAMUSCULAR | Status: DC | PRN
Start: 2021-09-08 — End: 2021-09-08

## 2021-09-08 MED ORDER — SODIUM CHLORIDE 0.9 % IV
INTRAVENOUS | Status: DC
Start: 2021-09-08 — End: 2021-09-08
  Administered 2021-09-08: 15:00:00 via INTRAVENOUS

## 2021-09-08 MED ORDER — NALOXONE 0.4 MG/ML INJECTION
0.4 mg/mL | INTRAMUSCULAR | Status: DC | PRN
Start: 2021-09-08 — End: 2021-09-08

## 2021-09-08 MED ORDER — SIMETHICONE 40 MG/0.6 ML ORAL DROPS, SUSP
40 mg/0.6 mL | ORAL | Status: DC | PRN
Start: 2021-09-08 — End: 2021-09-08

## 2021-09-08 MED ORDER — ATROPINE 0.1 MG/ML SYRINGE
0.1 mg/mL | Freq: Once | INTRAMUSCULAR | Status: DC | PRN
Start: 2021-09-08 — End: 2021-09-08

## 2021-09-08 MED ORDER — EPINEPHRINE 0.1 MG/ML SYRINGE
0.1 mg/mL | Freq: Once | INTRAMUSCULAR | Status: DC | PRN
Start: 2021-09-08 — End: 2021-09-08

## 2021-09-08 MED ORDER — SODIUM CHLORIDE 0.9 % IJ SYRG
Freq: Three times a day (TID) | INTRAMUSCULAR | Status: DC
Start: 2021-09-08 — End: 2021-09-08

## 2021-09-08 MED ORDER — SODIUM CHLORIDE 0.9 % IJ SYRG
INTRAMUSCULAR | Status: DC | PRN
Start: 2021-09-08 — End: 2021-09-08

## 2021-09-08 MED ORDER — FLUMAZENIL 0.1 MG/ML IV SOLN
0.1 mg/mL | INTRAVENOUS | Status: DC | PRN
Start: 2021-09-08 — End: 2021-09-08

## 2021-09-08 MED ORDER — PROPOFOL 10 MG/ML IV EMUL
10 mg/mL | INTRAVENOUS | Status: DC | PRN
Start: 2021-09-08 — End: 2021-09-08
  Administered 2021-09-08: 15:00:00 via INTRAVENOUS

## 2021-09-08 MED ORDER — LIDOCAINE (PF) 20 MG/ML (2 %) IJ SOLN
20 mg/mL (2 %) | INTRAMUSCULAR | Status: DC | PRN
Start: 2021-09-08 — End: 2021-09-08
  Administered 2021-09-08: 15:00:00 via INTRAVENOUS

## 2021-09-08 MED FILL — BD POSIFLUSH NORMAL SALINE 0.9 % INJECTION SYRINGE: INTRAMUSCULAR | Qty: 40

## 2021-09-08 MED FILL — SODIUM CHLORIDE 0.9 % IV: INTRAVENOUS | Qty: 1000

## 2021-09-08 NOTE — H&P (Signed)
H&P by Eugenie Filler., MD at 09/08/21 [phone]                Author: Eugenie Filler., MD  Service: Gastroenterology  Author Type: Physician       Filed: 09/08/21 0953  Date of Service: 09/08/21 0952  Status: Signed          Editor: Eugenie Filler., MD (Physician)                                                Jeanine Luz, MD   [facility], [place], [place] [address]   [phone]   [email or web address]      Gastroenterology Outpatient History and Physical      Patient: Wendy Ali      Physician: Eugenie Filler., MD      Vital Signs: Blood pressure  (!) 160/69, pulse 81, temperature 97.8 ??F (36.6 ??C), resp. rate 20, height 5' 4"  (1.626 m), weight 79.8 kg (176 lb), SpO2 100 %, not currently breastfeeding.      Allergies: No Known Allergies      Chief Complaint: Screening colonoscopy      History of Present Illness: Here for a screening colonoscopy.  Last colonoscopy was about 15 years ago and showed no polyps. Currently has  no GI symptoms. No FH of colon cancer or polyps.      History:     Past Medical History:        Diagnosis  Date         ?  Arthritis       ?  Diabetes ([place])       ?  Hypercalcemia       ?  Hypercholesterolemia       ?  Hyperparathyroidism ([place])       ?  Hypertension       ?  Menopause       ?  Sleep apnea            cpap         ?  Vertigo            past hx           Past Surgical History:         Procedure  Laterality  Date          ?  HX BREAST BIOPSY  Left  1984          benign          ?  HX KNEE REPLACEMENT         ?  HX OTHER SURGICAL              colonoscopy          ?  PR ARTHRP KNE CONDYLE&PLATU MEDIAL&LAT COMPARTMENTS  Right  2002           Social History          Socioeconomic History         ?  Marital status:  MARRIED       Tobacco Use         ?  Smoking status:  Never     ?  Smokeless tobacco:  Never       Vaping Use         ?  Vaping Use:  Never used        Substance and Sexual Activity         ?  Alcohol use:  Yes              Alcohol/week:  1.0 standard drink         Types:  1 Glasses of wine per week             Comment: once a month         ?  Drug use:  Never         ?  Sexual activity:  Not Currently           Family History         Problem  Relation  Age of Onset          ?  No Known Problems  Mother                murdered          ?  Stroke  Father       ?  Hypertension  Father       ?  Diabetes  Sister       ?  Hypertension  Sister       ?  Hypertension  Brother       ?  Heart defect  Brother                defibrilattor          ?  Diabetes  Brother       ?  Heart Disease  Brother                has pacemaker          ?  Ovarian Cancer  Niece       ?  Cancer  Niece                x2          ?  No Known Problems  Maternal Grandmother       ?  No Known Problems  Maternal Grandfather       ?  No Known Problems  Paternal Grandmother            ?  No Known Problems  Paternal Grandfather             Patient Active Problem List        Diagnosis  Code         ?  Essential hypertension  I10     ?  Hyperlipidemia  E78.5     ?  Obstructive sleep apnea  G47.33     ?  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  Type 2 diabetes mellitus without complication (HCC)  D17.6     ?  Aftercare following right knee joint replacement surgery  Z47.1, Z96.651     ?  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  Chronic renal disease, stage III  N18.30           Medications:      Prior to Admission medications             Medication  Sig  Start Date  End Date  Taking?  Authorizing Provider            aspirin 81  mg cap  aspirin 81 mg capsule    Take 1 capsule every day by oral route.      Yes  Provider, Historical     simvastatin (ZOCOR) 20 mg tablet  Take 1 Tablet by mouth nightly. TAKE 1 TABLET BY MOUTH AT  NIGHT FOR HIGH CHOLESTEROL  03/29/21    Yes  Domah, Carlye Grippe, MD     valsartan-hydroCHLOROthiazide (DIOVAN-HCT) 80-12.5 mg per tablet   Take 1 Tablet by mouth daily. FOR HIGH BLOOD PRESSURE  03/29/21    Yes  Domah, Carlye Grippe, MD     metFORMIN (GLUCOPHAGE) 1,000 mg tablet  Take 1 Tablet by mouth two (2) times daily (with meals).  03/29/21    Yes  Domah, Carlye Grippe, MD     b complex vitamins tablet  Take 1,000 mcg by mouth daily.      Yes  Provider, Historical     cholecalciferol (VITAMIN D3) 25 mcg (1,000 unit) cap  Take 1,000 Units by mouth daily. TAKES 500 MCG DAILY      Yes  Provider, Historical     cyanocobalamin 1,000 mcg tablet  Take 1,000 mcg by mouth daily.      Yes  Provider, Historical     ascorbic acid, vitamin C, (VITAMIN C) 500 mg tablet  Take 500 mg by mouth daily.      Yes  Provider, Historical     vitamin E (AQUA GEMS) 400 unit capsule  Take 400 Units by mouth daily.      Yes  Provider, Historical            cpap machine kit  by Does Not Apply route.      Yes  Provider, Historical           Physical Exam:         General: well developed, well nourished     HEENT: unremarkable     Heart: regular rhythm no mumur      Lungs: clear     Abdominal:  benign     Neurological: unremarkable       Extremities: no edema        Findings/Diagnosis: Screening colonoscopy      Plan of Care/Planned Procedure: Colonoscopy with monitored anesthesia care sedation      Signed:   Eugenie Filler., MD 09/08/2021

## 2021-09-08 NOTE — Anesthesia Pre-Procedure Evaluation (Signed)
Relevant Problems   RESPIRATORY SYSTEM   (+) Obstructive sleep apnea      CARDIOVASCULAR   (+) Essential hypertension      RENAL FAILURE   (+) Chronic renal disease, stage III      ENDOCRINE   (+) Type 2 diabetes mellitus with chronic kidney disease (HCC)   (+) Type 2 diabetes mellitus without complication (HCC)       Anesthetic History   No history of anesthetic complications            Review of Systems / Medical History  Patient summary reviewed, nursing notes reviewed and pertinent labs reviewed    Pulmonary  Within defined limits      Sleep apnea           Neuro/Psych   Within defined limits           Cardiovascular  Within defined limits  Hypertension          Hyperlipidemia    Exercise tolerance: >4 METS  Comments: LV: Estimated LVEF is 60 - 65%. Normal cavity size and systolic function (ejection fraction normal). Mild concentric hypertrophy. Wall motion: normal. Mild (grade 1) left ventricular diastolic dysfunction.   GI/Hepatic/Renal  Within defined limits              Endo/Other  Within defined limits  Diabetes    Arthritis     Other Findings              Physical Exam    Airway  Mallampati: II  TM Distance: 4 - 6 cm  Neck ROM: normal range of motion, short neck   Mouth opening: Normal     Cardiovascular  Regular rate and rhythm,  S1 and S2 normal,  no murmur, click, rub, or gallop             Dental  No notable dental hx       Pulmonary  Breath sounds clear to auscultation               Abdominal  GI exam deferred       Other Findings            Anesthetic Plan    ASA: 3  Anesthesia type: MAC      Post-op pain plan if not by surgeon: peripheral nerve block single    Induction: Intravenous  Anesthetic plan and risks discussed with: Patient

## 2021-09-08 NOTE — Progress Notes (Signed)
Endoscope was pre-cleaned at bedside immediately following procedure by Althea Grimmer, ET.

## 2021-09-08 NOTE — Anesthesia Post-Procedure Evaluation (Signed)
Procedure(s):  COLONOSCOPY.    MAC, total IV anesthesia    Anesthesia Post Evaluation        Patient location during evaluation: PACU  Note status: Adequate.  Level of consciousness: responsive to verbal stimuli and sleepy but conscious  Pain management: satisfactory to patient  Airway patency: patent  Anesthetic complications: no  Cardiovascular status: acceptable  Respiratory status: acceptable  Hydration status: acceptable  Comments: +Post-Anesthesia Evaluation and Assessment    Patient: Wendy Ali MRN: [id number]  SSN: [id number]   Date of Birth: September 20, 1940  Age: 81 y.o.  Sex: female      Cardiovascular Function/Vital Signs    BP 136/70    Pulse 63    Temp 36.6 ??C (97.8 ??F)    Resp 16    Ht _0  (1.626 m)    Wt 79.8 kg (176 lb)    SpO2 99%    Breastfeeding No    BMI 30.21 kg/m??     Patient is status post Procedure(s):  COLONOSCOPY.    Nausea/Vomiting: Controlled.    Postoperative hydration reviewed and adequate.    Pain:  Pain Scale 1: Numeric (0 - 10) (09/08/21 1042)  Pain Intensity 1: 0 (09/08/21 1042)   Managed.    Neurological Status:       At baseline.    Mental Status and Level of Consciousness: Arousable.    Pulmonary Status:   O2 Device: None (Room air) (09/08/21 1042)   Adequate oxygenation and airway patent.    Complications related to anesthesia: None    Post-anesthesia assessment completed. No concerns.    Signed By: Talmage Coin, DO    09/08/2021  Post anesthesia nausea and vomiting:  controlled      INITIAL Post-op Vital signs:   Vitals Value Taken Time   BP 136/70 09/08/21 1042   Temp     Pulse 76 09/08/21 1043   Resp 23 09/08/21 1043   SpO2 98 % 09/08/21 1042   Vitals shown include unvalidated device data.

## 2021-09-08 NOTE — Procedures (Signed)
Procedures by Eugenie Filler., MD at 09/08/21 1019                Author: Eugenie Filler., MD  Service: Gastroenterology  Author Type: Physician       Filed: 09/08/21 1022  Date of Service: 09/08/21 1019  Status: Signed          Editor: Eugenie Filler., MD (Physician)            Pre-procedure Diagnoses        1. Screening for colon cancer [Z12.11]                           Post-procedure Diagnoses        1. Screening for colon cancer [Z12.11]                           Procedures        1. PR COLON CA SCRN NOT HI [facility] IND [[id number] ([place]                        Colonoscopy Operative Report      09/08/2021         Wendy Ali   [id number]   [birth year]      Procedure Type:   Colonoscopy --screening       Indications:    Screening colonoscopy       Pre-operative Diagnosis: see indication above      Post-operative Diagnosis:  See findings below      Operator:  Elmon Else, MD      Referring Provider: Carloyn Manner, MD         Sedation:  MAC anesthesia Propofol      Pre-Procedural Exam:        Airway: clear,  No airway problems anticipated   Heart: RRR, without gallops or rubs   Lungs: clear bilaterally without wheezes, crackles, or rhonchi   Abdomen: soft, nontender, nondistended, bowel sounds present   Mental Status: awake, alert and oriented to person, place and time       Procedure Details:  After informed consent was obtained with all risks and benefits of procedure explained and preoperative exam completed,  the patient was taken to the endoscopy suite and placed in the left lateral decubitus position.  Upon sequential sedation as per above, a digital rectal exam was performed .  The Olympus videocolonoscope  was inserted in the rectum and carefully advanced  to the cecum, which was identified by the ileocecal valve and appendiceal orifice.  The cecum was identified by the ileocecal  valve and appendiceal orifice.  The quality of preparation was good.  The colonoscope was slowly withdrawn with careful evaluation  between folds. Retroflexion in the rectum was completed demonstrating internal hemorrhoids.       Findings:    Rectum: Grade 1 internal hemorrhoid(s);   Sigmoid:     - Diverticulosis   Descending Colon: normal   Transverse Colon: normal   Ascending Colon: normal   Cecum: normal   Terminal Ileum: not intubated         Specimen Removed:  none  Complications: None.       EBL:  None.      Impression:    normal colonic mucosa throughout   diverticulosis,  Mild in degree, involving the sigmoid   hemorrhoids internal, Moderate in size      Recommendations: --Follow up with primary care physician. High fiber diet.  Resume normal medication(s). No repeat screening colonoscopy indicated due to age.        Discharge Disposition:  Home in the company of a driver when able to ambulate.      Eugenie Filler., MD      09/08/2021       P. Jethro Bolus, MD   [facility], [place], [place] [address]   [phone]   [email or web address]

## 2021-09-08 NOTE — Progress Notes (Signed)
 Wendy Ali  1940-08-31  [id number]    Situation:  Verbal report received from: Wanda, RN  Procedure: Procedure(s):  COLONOSCOPY    Background:    Preoperative diagnosis: Type 2 diabetes mellitus without complication, unspecified whether long term insulin use (HCC) [E11.9]  Encounter for screening colonoscopy [Z12.11]  Postoperative diagnosis: diverticulosis, hemorrhoids    Operator:  Dr. Tawni  Assistant(s): Endoscopy Technician-1: Nona Krabbe  Endoscopy RN-1: Starlett Wanda BRAVO, RN    Specimens: * No specimens in log *  H. Pylori  no    Assessment:  Intra-procedure medications     Anesthesia gave intra-procedure sedation and medications, see anesthesia flow sheet yes    Intravenous fluids: NS@ KVO     Vital signs stable yes    Abdominal assessment: round and soft yes    Recommendation:  Discharge patient per MD order yes.  Return to floor n/a  Family or Friend Chaney, husb  Permission to share finding with family or friend yes

## 2021-09-13 NOTE — Telephone Encounter (Signed)
 Returned patient's phone call and was told she was requesting an antibiotic for tooth and mouth pain. It was explained to the patient that Dr. Lansing is an orthopedic and if she was having mouth and tooth pain and wanted an antibiotic she should call her dentist office.

## 2022-01-03 ENCOUNTER — Encounter

## 2022-01-10 ENCOUNTER — Inpatient Hospital Stay: Admit: 2022-01-10 | Payer: MEDICARE | Attending: Internal Medicine | Primary: Internal Medicine

## 2022-01-10 DIAGNOSIS — Z1231 Encounter for screening mammogram for malignant neoplasm of breast: Secondary | ICD-10-CM

## 2022-04-09 ENCOUNTER — Ambulatory Visit: Admit: 2022-04-09 | Discharge: 2022-09-25 | Payer: MEDICARE | Attending: Internal Medicine | Primary: Internal Medicine

## 2022-04-09 DIAGNOSIS — Z0000 Encounter for general adult medical examination without abnormal findings: Secondary | ICD-10-CM

## 2022-04-09 LAB — AMB POC GLUCOSE BLOOD, BY GLUCOSE MONITORING DEVICE: Glucose, POC: 229 MG/DL

## 2022-04-09 LAB — AMB POC HEMOGLOBIN A1C: Hemoglobin A1C, POC: 5.5 %

## 2022-04-09 NOTE — Progress Notes (Unsigned)
Medicare Annual Wellness Visit    Wendy Ali is here for Medicare [place] was seen today for medicare awv.    Diagnoses and all orders for this visit:    Medicare annual wellness visit, subsequent  -     Comprehensive Metabolic Panel; Future  -     CBC with Auto Differential; Future    Encounter for administration of vaccine  -     Influenza, FLUAD, (age 83 y+), IM, Preservative Free, 0.5 mL    Type 2 diabetes mellitus with diabetic nephropathy, with long-term current use of insulin (HCC)  -     AMB POC HEMOGLOBIN A1C  -     AMB POC GLUCOSE BLOOD, BY GLUCOSE MONITORING DEVICE  -     Comprehensive Metabolic Panel; Future  -     CBC with Auto Differential; Future  -     Microalbumin / Creatinine Urine Ratio; Future    Essential hypertension  -     Comprehensive Metabolic Panel; Future  -     CBC with Auto Differential; Future    Pure hypercholesterolemia, unspecified  -     Lipid Panel; Future    Hypothyroidism, unspecified type  -     TSH; Future    Acute cystitis without hematuria  -     Urinalysis with Reflex to Culture; Future      Recommendations for Preventive Services Due: see orders and patient instructions/AVS.  Recommended screening schedule for the next 5-10 years is provided to the patient in written form: see Patient Instructions/AVS.     Return in 3 months (on 07/09/2022).     Subjective   Wendy Ali is a 81 y.o. female    Patient's complete Health Risk Assessment and screening values have been reviewed and are found in Flowsheets. The following problems were reviewed today and where indicated follow up appointments were made and/or referrals ordered.    No Positive Risk Factors identified today.    {OPTIONAL- LDCT, CVD, STI Counseling Statements:[id number]}                                 Objective   Vitals:    04/09/22 1136   BP: 130/65   Pulse: 86   Resp: 20   Temp: 97.8 F (36.6 C)   TempSrc: Temporal   Weight: 173 lb (78.5 kg)   Height: '5\' 4"'$  (1.626 m)      Body  mass index is 29.7 kg/m.        {OPTIONAL - GENERAL PHYSICAL EXAM (WILL AUTO-DELETE IF NOT USED):[id number]}       No Known Allergies  Prior to Visit Medications    Medication Sig Taking? Authorizing Provider   ascorbic acid (VITAMIN C) 500 MG tablet Take 1 tablet by mouth daily Yes Ar Automatic Reconciliation   Aspirin (VAZALORE) 81 MG CAPS aspirin 81 mg capsule   Take 1 capsule every day by oral route. Yes Ar Automatic Reconciliation   vitamin D 25 MCG (1000 UT) CAPS Take 1 capsule by mouth daily Yes Ar Automatic Reconciliation   cyanocobalamin 1000 MCG tablet Take 1 tablet by mouth daily Yes Ar Automatic Reconciliation   metFORMIN (GLUCOPHAGE) 1000 MG tablet Take 1 tablet by mouth 2 times daily (with meals) Yes Ar Automatic Reconciliation   simvastatin (ZOCOR) 20 MG tablet Take 1 tablet by mouth Yes Ar Automatic Reconciliation  valsartan-hydroCHLOROthiazide (DIOVAN-HCT) 80-12.5 MG per tablet Take 1 tablet by mouth daily Yes Ar Automatic Reconciliation       CareTeam (Including outside providers/suppliers regularly involved in providing care):   Patient Care Team:  Levada Dy, MD as PCP - General (Internal Medicine)  Levada Dy, MD as PCP - Empaneled Provider  Kathrin Penner, MD as Physician  Pindipapanahalli Ramond Dial, MD as Physician     Reviewed and updated this visit:  Allergies  Meds  Problems  Med Hx  Surg Hx  Soc Hx  Fam Hx

## 2022-04-09 NOTE — Patient Instructions (Signed)
A Healthy Heart: Care Instructions  Your Care Instructions     Coronary artery disease, also called heart disease, occurs when a substance called plaque builds up in the vessels that supply oxygen-rich blood to your heart muscle. This can narrow the blood vessels and reduce blood flow. A heart attack happens when blood flow is completely blocked. A high-fat diet, smoking, and other factors increase the risk of heart disease.  Your doctor has found that you have a chance of having heart disease. You can do lots of things to keep your heart healthy. It may not be easy, but you can change your diet, exercise more, and quit smoking. These steps really work to lower your chance of heart disease.  Follow-up care is a key part of your treatment and safety. Be sure to make and go to all appointments, and call your doctor if you are having problems. It's also a good idea to know your test results and keep a list of the medicines you take.  How can you care for yourself at home?  Diet   Use less salt when you cook and eat. This helps lower your blood pressure. Taste food before salting. Add only a little salt when you think you need it. With time, your taste buds will adjust to less salt.    Eat fewer snack items, fast foods, canned soups, and other high-salt, high-fat, processed foods.    Read food labels and try to avoid saturated and trans fats. They increase your risk of heart disease by raising cholesterol levels.    Limit the amount of solid fat-butter, margarine, and shortening-you eat. Use olive, peanut, or canola oil when you cook. Bake, broil, and steam foods instead of frying them.    Eat a variety of fruit and vegetables every day. Dark green, deep orange, red, or yellow fruits and vegetables are especially good for you. Examples include spinach, carrots, peaches, and berries.    Foods high in fiber can reduce your cholesterol and provide important vitamins and minerals. High-fiber foods include  whole-grain cereals and breads, oatmeal, beans, brown rice, citrus fruits, and apples.    Eat lean proteins. Heart-healthy proteins include seafood, lean meats and poultry, eggs, beans, peas, nuts, seeds, and soy products.    Limit drinks and foods with added sugar. These include candy, desserts, and soda pop.   Lifestyle changes   If your doctor recommends it, get more exercise. Walking is a good choice. Bit by bit, increase the amount you walk every day. Try for at least 30 minutes on most days of the week. You also may want to swim, bike, or do other activities.    Do not smoke. If you need help quitting, talk to your doctor about stop-smoking programs and medicines. These can increase your chances of quitting for good. Quitting smoking may be the most important step you can take to protect your heart. It is never too late to quit.    Limit alcohol to 2 drinks a day for men and 1 drink a day for women. Too much alcohol can cause health problems.    Manage other health problems such as diabetes, high blood pressure, and high cholesterol. If you think you may have a problem with alcohol or drug use, talk to your doctor.   Medicines   Take your medicines exactly as prescribed. Call your doctor if you think you are having a problem with your medicine.    If your doctor recommends aspirin, take  the amount directed each day. Make sure you take aspirin and not another kind of pain reliever, such as acetaminophen (Tylenol).   When should you call for help?   Call 911 if you have symptoms of a heart attack. These may include:   Chest pain or pressure, or a strange feeling in the chest.    Sweating.    Shortness of breath.    Pain, pressure, or a strange feeling in the back, neck, jaw, or upper belly or in one or both shoulders or arms.    Lightheadedness or sudden weakness.    A fast or irregular heartbeat.   After you call 911, the operator may tell you to chew 1 adult-strength or 2 to 4 low-dose aspirin.  Wait for an ambulance. Do not try to drive yourself.  Watch closely for changes in your health, and be sure to contact your doctor if you have any problems.  Where can you learn more?  Go to https://www.bennett.info/ and enter F075 to learn more about "A Healthy Heart: Care Instructions."  Current as of: June 25, 2023Content Version: 13.8   2006-2023 Healthwise, [employer].   Care instructions adapted under license by [facility]. If you have questions about a medical condition or this instruction, always ask your healthcare professional. [place] any warranty or liability for your use of this information.      Personalized Preventive Plan for Wendy Ali - 04/09/2022  Medicare offers a range of preventive health benefits. Some of the tests and screenings are paid in full while other may be subject to a deductible, co-insurance, and/or copay.    Some of these benefits include a comprehensive review of your medical history including lifestyle, illnesses that may run in your family, and various assessments and screenings as appropriate.    After reviewing your medical record and screening and assessments performed today your provider may have ordered immunizations, labs, imaging, and/or referrals for you.  A list of these orders (if applicable) as well as your Preventive Care list are included within your After Visit Summary for your review.    Other Preventive Recommendations:    A preventive eye exam performed by an eye specialist is recommended every 1-2 years to screen for glaucoma; cataracts, macular degeneration, and other eye disorders.  A preventive dental visit is recommended every 6 months.  Try to get at least 150 minutes of exercise per week or 10,000 steps per day on a pedometer .  Order or download the FREE "Exercise & Physical Activity: Your Everyday Guide" from [employer]. Call (985)123-9789 or search [employer] online.  You need 1200-1500 mg of calcium and 1000-2000 IU of vitamin D per day. It is possible to meet your calcium requirement with diet alone, but a vitamin D supplement is usually necessary to meet this goal.  When exposed to the sun, use a sunscreen that protects against both UVA and UVB radiation with an SPF of 30 or greater. Reapply every 2 to 3 hours or after sweating, drying off with a towel, or swimming.  Always wear a seat belt when traveling in a car. Always wear a helmet when riding a bicycle or motorcycle.

## 2022-04-10 ENCOUNTER — Encounter

## 2022-04-10 LAB — URINALYSIS WITH REFLEX TO CULTURE
BACTERIA, URINE: NEGATIVE /hpf
Bilirubin Urine: NEGATIVE
Glucose, UA: NEGATIVE mg/dL
Ketones, Urine: NEGATIVE mg/dL
Nitrite, Urine: NEGATIVE
Protein, UA: NEGATIVE mg/dL
Specific Gravity, UA: 1.015 (ref 1.003–1.030)
Urobilinogen, Urine: 0.2 EU/dL (ref 0.2–1.0)
pH, Urine: 6 (ref 5.0–8.0)

## 2022-04-10 LAB — CBC WITH AUTO DIFFERENTIAL
Absolute Immature Granulocyte: 0 10*3/uL (ref 0.00–0.04)
Basophils %: 1 % (ref 0–1)
Basophils Absolute: 0 10*3/uL (ref 0.0–0.1)
Eosinophils %: 1 % (ref 0–7)
Eosinophils Absolute: 0.1 10*3/uL (ref 0.0–0.4)
Hematocrit: 34.8 % — ABNORMAL LOW (ref 35.0–47.0)
Hemoglobin: 11.1 g/dL — ABNORMAL LOW (ref 11.5–16.0)
Immature Granulocytes: 0 % (ref 0.0–0.5)
Lymphocytes %: 41 % (ref 12–49)
Lymphocytes Absolute: 2 10*3/uL (ref 0.8–3.5)
MCH: 27.3 PG (ref 26.0–34.0)
MCHC: 31.9 g/dL (ref 30.0–36.5)
MCV: 85.5 FL (ref 80.0–99.0)
MPV: 10 FL (ref 8.9–12.9)
Monocytes %: 9 % (ref 5–13)
Monocytes Absolute: 0.4 10*3/uL (ref 0.0–1.0)
Neutrophils %: 48 % (ref 32–75)
Neutrophils Absolute: 2.4 10*3/uL (ref 1.8–8.0)
Nucleated RBCs: 0 PER 100 WBC
Platelets: 371 10*3/uL (ref 150–400)
RBC: 4.07 M/uL (ref 3.80–5.20)
RDW: 15.1 % — ABNORMAL HIGH (ref 11.5–14.5)
WBC: 5 10*3/uL (ref 3.6–11.0)
nRBC: 0 10*3/uL (ref 0.00–0.01)

## 2022-04-11 LAB — MICROALBUMIN / CREATININE URINE RATIO
Creatinine, Ur: 95.4 mg/dL
Microalbumin Creatinine Ratio: 19 mg/g (ref 0–30)
Microalbumin, Random Urine: 1.79 MG/DL

## 2022-04-11 LAB — COMPREHENSIVE METABOLIC PANEL
ALT: 16 U/L (ref 12–78)
AST: 22 U/L (ref 15–37)
Albumin/Globulin Ratio: 1.1 (ref 1.1–2.2)
Albumin: 3.7 g/dL (ref 3.5–5.0)
Alk Phosphatase: 76 U/L (ref 45–117)
Anion Gap: 4 mmol/L — ABNORMAL LOW (ref 5–15)
BUN: 16 MG/DL (ref 6–20)
Bun/Cre Ratio: 17 (ref 12–20)
CO2: 28 mmol/L (ref 21–32)
Calcium: 10 MG/DL (ref 8.5–10.1)
Chloride: 107 mmol/L (ref 97–108)
Creatinine: 0.93 MG/DL (ref 0.55–1.02)
Est, Glom Filt Rate: >60 mL/min/{1.73_m2} (ref >60)
Globulin: 3.3 g/dL (ref 2.0–4.0)
Glucose: 94 mg/dL (ref 65–100)
Potassium: 4.1 mmol/L (ref 3.5–5.1)
Sodium: 139 mmol/L (ref 136–145)
Total Bilirubin: 0.6 MG/DL (ref 0.2–1.0)
Total Protein: 7 g/dL (ref 6.4–8.2)

## 2022-04-11 LAB — LIPID PANEL
Chol/HDL Ratio: 1.7 (ref 0.0–5.0)
Cholesterol, Total: 127 MG/DL (ref <200)
HDL: 74 MG/DL
LDL Calculated: 40 MG/DL (ref 0–100)
Triglycerides: 65 MG/DL (ref <150)
VLDL Cholesterol Calculated: 13 MG/DL

## 2022-04-11 LAB — TSH: TSH, 3RD GENERATION: 0.5 u[IU]/mL (ref 0.36–3.74)

## 2022-04-12 LAB — CULTURE, URINE
Culture: NO GROWTH
Culture: NO GROWTH

## 2022-05-07 ENCOUNTER — Ambulatory Visit: Admit: 2022-05-07 | Discharge: 2022-05-11 | Payer: MEDICARE | Attending: Internal Medicine | Primary: Internal Medicine

## 2022-05-07 DIAGNOSIS — E119 Type 2 diabetes mellitus without complications: Secondary | ICD-10-CM

## 2022-05-07 DIAGNOSIS — I10 Essential (primary) hypertension: Secondary | ICD-10-CM

## 2022-05-07 MED ORDER — NITROFURANTOIN MONOHYD MACRO 100 MG PO CAPS
100 MG | ORAL_CAPSULE | Freq: Two times a day (BID) | ORAL | 0 refills | Status: AC
Start: 2022-05-07 — End: 2022-05-14

## 2022-05-07 MED ORDER — SIMVASTATIN 20 MG PO TABS
20 MG | ORAL_TABLET | Freq: Every evening | ORAL | 3 refills | Status: AC
Start: 2022-05-07 — End: 2022-07-02

## 2022-05-07 MED ORDER — VALSARTAN-HYDROCHLOROTHIAZIDE 80-12.5 MG PO TABS
80-12.5 MG | ORAL_TABLET | Freq: Every day | ORAL | 3 refills | Status: AC
Start: 2022-05-07 — End: 2022-07-02

## 2022-05-07 MED ORDER — METFORMIN HCL 1000 MG PO TABS
1000 MG | ORAL_TABLET | Freq: Two times a day (BID) | ORAL | 3 refills | Status: AC
Start: 2022-05-07 — End: 2022-07-02

## 2022-05-07 NOTE — Progress Notes (Unsigned)
Wendy Ali is a 81 y.o. female and presents with Results and Cyst (Upper back )  .           Review of Systems  As per hpi    Past Medical History:   Diagnosis Date    Arthritis     Diabetes (HCC)     Hypercalcemia     Hypercholesterolemia     Hyperparathyroidism (HCC)     Hypertension     Menopause     Sleep apnea     cpap    Vertigo     past hx     Past Surgical History:   Procedure Laterality Date    BREAST BIOPSY Left 1984    benign    COLONOSCOPY N/A 09/08/2021    COLONOSCOPY performed by Herbert Moors., MD at [facility]    OTHER SURGICAL HISTORY      colonoscopy    TOTAL KNEE ARTHROPLASTY Right 2002    TOTAL KNEE ARTHROPLASTY       Social History     Socioeconomic History    Marital status: Married     Spouse name: None    Number of children: None    Years of education: None    Highest education level: None   Tobacco Use    Smoking status: Never    Smokeless tobacco: Never   Substance and Sexual Activity    Alcohol use: Yes     Alcohol/week: 1.0 standard drink of alcohol    Drug use: Never    Sexual activity: Not Currently     Partners: Male     Social Determinants of Health     Financial Resource Strain: Low Risk  (04/09/2022)    Overall Financial Resource Strain (CARDIA)     Difficulty of Paying Living Expenses: Not hard at all   Food Insecurity: No Food Insecurity (04/09/2022)    Hunger Vital Sign     Worried About Running Out of Food in the Last Year: Never true     Ran Out of Food in the Last Year: Never true   Transportation Needs: Unknown (04/09/2022)    PRAPARE - Transportation     Lack of Transportation (Non-Medical): No   Physical Activity: Sufficiently Active (04/09/2022)    Exercise Vital Sign     Days of Exercise per Week: 4 days     Minutes of Exercise per Session: 40 min   Housing Stability: Unknown (04/09/2022)    Housing Stability Vital Sign     Unstable Housing in the Last Year: No     Family History   Problem Relation Age of Onset    No Known Problems Paternal Grandfather      No Known Problems Mother         murdered    No Known Problems Paternal Grandmother     No Known Problems Maternal Grandfather     No Known Problems Maternal Grandmother     Cancer Niece         x2    Ovarian Cancer Niece     Heart Disease Brother         has pacemaker    Diabetes Brother     Heart Defect Brother         defibrilattor    Stroke Father     Hypertension Father     Diabetes Sister     Hypertension Sister     Hypertension Brother      Current  Outpatient Medications   Medication Sig Dispense Refill    ascorbic acid (VITAMIN C) 500 MG tablet Take 1 tablet by mouth daily      Aspirin (VAZALORE) 81 MG CAPS aspirin 81 mg capsule   Take 1 capsule every day by oral route.      vitamin D 25 MCG (1000 UT) CAPS Take 1 capsule by mouth daily      cyanocobalamin 1000 MCG tablet Take 1 tablet by mouth daily      metFORMIN (GLUCOPHAGE) 1000 MG tablet Take 1 tablet by mouth 2 times daily (with meals)      simvastatin (ZOCOR) 20 MG tablet Take 1 tablet by mouth      valsartan-hydroCHLOROthiazide (DIOVAN-HCT) 80-12.5 MG per tablet Take 1 tablet by mouth daily       No current facility-administered medications for this visit.     No Known Allergies    Objective:  BP 132/66   Pulse 84   Temp 97.5 F (36.4 C) (Temporal)   Resp 20   Ht 1.626 m (5\' 4" )   Wt 78.5 kg (173 lb)   SpO2 98%   BMI 29.70 kg/m   Physical Exam:   General appearance - alert, well appearing, and in no distress  Mental status - alert, oriented to person, place, and time  EYE-PERLA, EOMI, fundi normal, corneas normal, no foreign bodies  ENT-ENT exam normal, no neck nodes or sinus tenderness  Nose - normal and patent, no erythema, discharge or polyps  Mouth - mucous membranes moist, pharynx normal without lesions  Neck - supple, no significant adenopathy   Chest - clear to auscultation, no wheezes, rales or rhonchi, symmetric air entry   Heart - normal rate, regular rhythm, normal S1, S2, no murmurs, rubs, clicks or gallops   Abdomen - soft,  nontender, nondistended, no masses or organomegaly  Lymph- no adenopathy palpable  Ext-peripheral pulses normal, no pedal edema, no clubbing or cyanosis  Skin-Warm and dry. no hyperpigmentation, vitiligo, or suspicious lesions  Neuro -alert, oriented, normal speech, no focal findings or movement disorder noted  Back-{back exam:315940::"full range of motion, no tenderness, palpable spasm or pain on motion"}   Knee-{knee exam:315766}   Shoulder-{shoulder exam:315773}  Neck- {neck ZOXW:960454}      Assessment/Plan:  Wendy Ali was seen today for results and cyst.    Diagnoses and all orders for this visit:    Type 2 diabetes mellitus without complication, without long-term current use of insulin (HCC)  -     metFORMIN (GLUCOPHAGE) 1000 MG tablet; Take 1 tablet by mouth 2 times daily (with meals)    Pure hypercholesterolemia, unspecified  -     simvastatin (ZOCOR) 20 MG tablet; Take 1 tablet by mouth nightly    Essential (primary) hypertension  -     valsartan-hydroCHLOROthiazide (DIOVAN-HCT) 80-12.5 MG per tablet; Take 1 tablet by mouth daily    Lipoma of back  -     [facility] - Cory Roughen, MD, General Surgery, [place]    Lump of skin of back  -     [facility] - Cory Roughen, MD, General Surgery, [place]    Acute cystitis without hematuria  -     nitrofurantoin, macrocrystal-monohydrate, (MACROBID) 100 MG capsule; Take 1 capsule by mouth 2 times daily for 7 days      Return in about 6 months (around 11/06/2022) for routine follow up.

## 2022-05-07 NOTE — Telephone Encounter (Signed)
Lm to schedule initial consult from wq 10.23.23

## 2022-05-18 ENCOUNTER — Ambulatory Visit: Admit: 2022-05-18 | Discharge: 2022-05-18 | Payer: MEDICARE | Attending: Surgery | Primary: Internal Medicine

## 2022-05-18 DIAGNOSIS — R1011 Right upper quadrant pain: Secondary | ICD-10-CM

## 2022-05-18 DIAGNOSIS — D171 Benign lipomatous neoplasm of skin and subcutaneous tissue of trunk: Secondary | ICD-10-CM

## 2022-05-18 NOTE — Progress Notes (Signed)
Identified pt with two pt identifiers(name and DOB). Reviewed record in preparation for visit and have obtained necessary documentation. All patient medications has been reviewed.    Chief Complaint   Patient presents with    Skin Problem     Seen at the request of Dr. Leonor Liv eval lipoma on back        Health Maintenance Due   Topic    DTaP/Tdap/Td vaccine (1 - Tdap)    Shingles vaccine (1 of 2)    Hepatitis B vaccine (1 of 3 - Risk 3-dose series)    COVID-19 Vaccine (4 - Moderna series)    Pneumococcal 65+ years Vaccine (2 - PCV)       '@VSPAIN'$ @    4.Have you been to the ER, urgent care clinic since your last visit?  Hospitalized since your last visit?no    5. Have you seen or consulted any other health care providers outside of the [facility] since your last visit?  Include any pap smears or colon screening. no      Patient is accompanied by self I have received verbal consent from [place] to discuss any/all medical information while they are present in the room.

## 2022-05-25 ENCOUNTER — Encounter

## 2022-06-11 NOTE — Progress Notes (Signed)
To: Levada Dy, MD    From: Linward Natal, MD    Thank you for sending Wendy Ali to see [place].     Please note that this dictation was completed with Dragon, the computer voice recognition software.  Quite often unanticipated grammatical, syntax, homophones, and other interpretive errors are inadvertently transcribed by the software.  Please disregard these errors.  Please excuse any errors that have escaped final proofreading.      Encounter Date: 05/18/2022  History and Physical    Assessment:   Left medial scapular subcutaneous lipoma approximately 3 x 6 cm.  Tender.    Body mass index is 30.52 kg/m.    Comorbid diabetes, sleep apnea.  No known h/o heart disease or stroke.    Takes a daily baby aspirin but no other anticoagulants.    Plan:   I recommended excisional biopsy.      Risks including bleeding and infection were explained to the patient.  The patient expressed understanding of the risks, and all questions were answered to the patient's satisfaction.    HPI:   Wendy Ali is a 81 y.o. female who is seen in consultation at the request of Domah, Barry Dienes, MD for evaluation of a mass on the back.  It was first noticed several years ago.  It has not been inflamed.  It has occasionally been painful.  There has not been drainage.  It is gotten larger over the years.    Past Medical History:   Diagnosis Date    Arthritis     Diabetes ([place])     Hypercalcemia     Hypercholesterolemia     Hyperparathyroidism ([place])     Hypertension     Menopause     Sleep apnea     cpap    Vertigo     past hx     Past Surgical History:   Procedure Laterality Date    BREAST BIOPSY Left 1984    benign    COLONOSCOPY N/A 09/08/2021    COLONOSCOPY performed by Eugenie Filler., MD at [facility]    OTHER SURGICAL HISTORY      colonoscopy    TOTAL KNEE ARTHROPLASTY Right 2002    TOTAL KNEE ARTHROPLASTY        Family History   Problem Relation Age of Onset    No Known Problems Paternal Grandfather     No  Known Problems Mother         murdered    No Known Problems Paternal Grandmother     No Known Problems Maternal Grandfather     No Known Problems Maternal Grandmother     Cancer Niece         x2    Ovarian Cancer Niece     Heart Disease Brother         has pacemaker    Diabetes Brother     Heart Defect Brother         defibrilattor    Stroke Father     Hypertension Father     Diabetes Sister     Hypertension Sister     Hypertension Brother      Social History     Tobacco Use    Smoking status: Never    Smokeless tobacco: Never   Substance Use Topics    Alcohol use: Yes     Alcohol/week: 1.0 standard drink of alcohol      Current Outpatient Medications   Medication  Sig    valsartan-hydroCHLOROthiazide (DIOVAN-HCT) 80-12.5 MG per tablet Take 1 tablet by mouth daily    simvastatin (ZOCOR) 20 MG tablet Take 1 tablet by mouth nightly    metFORMIN (GLUCOPHAGE) 1000 MG tablet Take 1 tablet by mouth 2 times daily (with meals)    ascorbic acid (VITAMIN C) 500 MG tablet Take 1 tablet by mouth daily    Aspirin (VAZALORE) 81 MG CAPS aspirin 81 mg capsule   Take 1 capsule every day by oral route.    vitamin D 25 MCG (1000 UT) CAPS Take 1 capsule by mouth daily    cyanocobalamin 1000 MCG tablet Take 1 tablet by mouth daily     No current facility-administered medications for this visit.       Allergies:  No Known Allergies    Review of Systems:  10 systems reviewed.  See scanned sheet in "Media" section.  See HPI for pertinent positives and negatives.      Objective:     Vitals:    05/18/22 0846   Pulse: 68   Resp: 20   Temp: 97.9 F (36.6 C)   SpO2: 95%       Physical Exam:  General appearance  Alert, cooperative, no distress, appears stated age   HEENT Anicteric           Lungs   Clear to auscultation bilaterally   Heart  Regular rate and rhythm.         Extremities no cyanosis or edema   Pulses 2+ right radial       Lymph nodes No palpable LAD.   Neurologic Without overt sensory or motor deficit     Focused exam of the back  reveals a 3 x 6 subcutaneous lipomatous mass over the left medial scapula.    Signed By: Linward Natal, MD     June 11, 2022

## 2022-06-19 ENCOUNTER — Ambulatory Visit: Admit: 2022-06-19 | Discharge: 2022-06-19 | Payer: MEDICARE | Attending: Surgery | Primary: Internal Medicine

## 2022-06-19 DIAGNOSIS — M7989 Other specified soft tissue disorders: Secondary | ICD-10-CM

## 2022-06-19 NOTE — Progress Notes (Signed)
Excision Procedure Note    Procedure Date: 06/19/2022    Pre-operative diagnosis:  left scapular mass  Post-operative diagnosis:  same, subfascial  Procedure:  Excision of above    Specimen size:  6-7cm     Time out at performed by me (see consent form):  * Patient was identified by name and date of birth   * Agreement on procedure being performed was verified  * Risks and Benefits explained to the patient  * Procedure site verified and marked as necessary  * Patient was positioned for comfort  * Consent was signed and verified    Anesthesia: Local    Procedure Details:  The area was prepped and draped in the usual manner.  Local anesthesia was infiltrated into the skin and soft tissues surrounding the lesion.  An incision was made.  This was taken down into subcutaneous tissues with blunt and sharp dissection.  The mass was deep tot he fascia.  This was incised with scissors and blunt dissection was used to break up adhesions and the lesion was enucleated.  It  was sent to pathology.  The cavity was irrigated with saline. The incision was closed with a 3-0 Vicryl in 3 layers.  Glue dressing was then applied.     Estimated Blood Loss:  minimal    Disposition:  Procedure well tolerated.  Post-procedure pain scale: 0/10.    Signed By: Linward Natal, MD

## 2022-06-19 NOTE — Progress Notes (Signed)
Identified pt with two pt identifiers(name and DOB). Reviewed record in preparation for visit and have obtained necessary documentation. All patient medications has been reviewed.    Chief Complaint   Patient presents with    Procedure     Left upper back mass excisional biopsy        Health Maintenance Due   Topic    DTaP/Tdap/Td vaccine (1 - Tdap)    Shingles vaccine (1 of 2)    Hepatitis B vaccine (1 of 3 - Risk 3-dose series)    Respiratory Syncytial Virus (RSV) age 44 yrs+ (1 - 1-dose 60+ series)    Pneumococcal 65+ years Vaccine (2 - PCV)    COVID-19 Vaccine (4 - 2023-24 season)       Vitals:    06/19/22 1053   BP: (!) 153/79   Site: Right Upper Arm   Position: Sitting   Pulse: 68   Resp: 15   Temp: 97.3 F (36.3 C)   TempSrc: Temporal   SpO2: 97%   Weight: 80.7 kg (178 lb)   Height: 1.626 m ('5\' 4"'$ )         4.Have you been to the ER, urgent care clinic since your last visit?  Hospitalized since your last visit? No      5. Have you seen or consulted any other health care providers outside of the [place] since your last visit?  Include any pap smears or colon screening. No      Patient is accompanied by self I have received verbal consent from [place] to discuss any/all medical information while they are present in the room.     Patient and provider made aware of elevated BP x2. Patient asymptomatic. Patient reminded to monitor BP, continue to take BP medications if prescribed, and follow up with PCP/Cardiologist.  Patient expressed understanding and agreement.

## 2022-06-20 ENCOUNTER — Inpatient Hospital Stay: Admit: 2022-06-20 | Payer: MEDICARE | Primary: Internal Medicine

## 2022-07-01 ENCOUNTER — Encounter

## 2022-07-03 MED ORDER — METFORMIN HCL 1000 MG PO TABS
1000 MG | ORAL_TABLET | Freq: Two times a day (BID) | ORAL | 3 refills | Status: DC
Start: 2022-07-03 — End: 2023-06-01

## 2022-07-03 MED ORDER — VALSARTAN-HYDROCHLOROTHIAZIDE 80-12.5 MG PO TABS
80-12.5 MG | ORAL_TABLET | Freq: Every day | ORAL | 3 refills | Status: DC
Start: 2022-07-03 — End: 2023-05-02

## 2022-07-03 MED ORDER — SIMVASTATIN 20 MG PO TABS
20 MG | ORAL_TABLET | Freq: Every evening | ORAL | 3 refills | Status: AC
Start: 2022-07-03 — End: 2023-05-20

## 2022-08-16 ENCOUNTER — Encounter

## 2022-08-16 ENCOUNTER — Ambulatory Visit: Payer: MEDICARE | Primary: Internal Medicine

## 2022-08-21 LAB — RENAL FUNCTION PANEL
Albumin: 4.4 g/dL (ref 3.7–4.7)
BUN/Creatinine Ratio: 15 (ref 12–28)
BUN: 15 mg/dL (ref 8–27)
CO2: 23 mmol/L (ref 20–29)
Calcium: 10.5 mg/dL — ABNORMAL HIGH (ref 8.7–10.3)
Chloride: 103 mmol/L (ref 96–106)
Creatinine: 0.97 mg/dL (ref 0.57–1.00)
Est, Glomerular Filtration Rate: 58 mL/min/{1.73_m2} — ABNORMAL LOW (ref >59)
Glucose: 105 mg/dL — ABNORMAL HIGH (ref 70–99)
Phosphorus: 3.4 mg/dL (ref 3.0–4.3)
Potassium: 4.2 mmol/L (ref 3.5–5.2)
Sodium: 141 mmol/L (ref 134–144)

## 2022-08-21 LAB — LITHOLINK CKD PROGRAM

## 2022-08-21 LAB — VITAMIN D 25 HYDROXY: Vit D, 25-Hydroxy: 39.6 ng/mL (ref 30.0–100.0)

## 2022-08-21 LAB — PTH, INTACT: PTH: 37 pg/mL (ref 15–65)

## 2022-08-22 ENCOUNTER — Inpatient Hospital Stay: Admit: 2022-08-22 | Payer: MEDICARE | Attending: Internal Medicine | Primary: Internal Medicine

## 2022-08-22 DIAGNOSIS — E041 Nontoxic single thyroid nodule: Secondary | ICD-10-CM

## 2022-09-06 ENCOUNTER — Ambulatory Visit: Admit: 2022-09-06 | Discharge: 2022-09-06 | Payer: MEDICARE | Attending: Internal Medicine | Primary: Internal Medicine

## 2022-09-06 DIAGNOSIS — E213 Hyperparathyroidism, unspecified: Secondary | ICD-10-CM

## 2022-09-06 NOTE — Progress Notes (Signed)
Chief Complaint   Patient presents with    hyperparathyroid    Thyroid Problem    vitamin d deficiency     Pcp and pharmacy verified     History of Present Illness: Wendy Ali is a 82 y.o. female here for follow up of hypercalcemia and  hyperparathyroidism.    Pt presented to Dr. Leonor Liv in December 2020 for a new patient visit. At the time her Ca was 10.6 and her Vitamin D was 10.8 and her TSH was 1.29. Her renal function was good with Cr of 0.91.Dr. Leonor Liv repeated her Ca in February 2021 and at that time her Ca had declined to 10.3, but her PTH was 99.6.   She had a DXA scan and it did not show any evidence of osteoporosis or osteopenia.  She was started on Vitamin D 1000 units daily, which she had been taking for 6+ months.    At our initial visit in August 2021 I repeated her Vitamin D level which had improved to 36.3. Her 1,25-OH vitamin D was normal at 31.3 and her PTH had improved down to a normal range at 61.  Her Ca was 10.5.   Since her Ca level was under 11.2 and she is not having any symptoms or complications from the high calcium, we agreed to monitor her levels.    The records from her prior PCP in regards to her thyroid nodule stated that pt has prior thyroid nodule with FNA in October 2015 that was read as benign. She had a repeat US in November 2016 which showed no change in thyroid nodule from 2015.  She had right thyroid nodule measuring 1.8 x 1.8 x 1.1 cm and a left sided thyroid nodule measuring 4.7 x 2.6 x 2.9 cm, which was previously biopsied and read as benign.    At our last visit in June 2023 her PTH was down to 39 as her Vitamin D had improved to 44. Her Ca was 10.8 and her 1,25-OH Vitamin D was 31.8.    I repeated her Thyroid US and it showed a left sided 1.6 x 1.5 x 1.2 cm isoechoic rounded avascular nodule centrally.  As well as a right sided 2.6 x 1.8 x 1.2 cm echogenic slightly heterogeneous irregular.   I send her for an FNA of both nodules and they came back benign.    Pt denies  any recent illnesses, injuries or hospitalizations.    Pt is still taking the Valsartan-HCTZ 80/12.61m daily.    Her Ca in February 2024 was down to 10.5, her PTH was normal at 39, her Vitamin D was 39.6.    Pt is still taking the vitamin D 1000 units daily.    She denies renal stones, hematuria or dysuria.  No fall or fractures since our last visit and no hx of osteoporosis.  She denies issues of AMS, confusion or mood swings.  She denies any Ca our TUMS or any treatments for GERD.    Pt denies issues of dysphagia, dysphonia, voice changes or hoarseness in her voice.  She denies any changes in her neck or throat.    Current Outpatient Medications   Medication Sig    simvastatin (ZOCOR) 20 MG tablet TAKE 1 TABLET BY MOUTH NIGHTLY    valsartan-hydroCHLOROthiazide (DIOVAN-HCT) 80-12.5 MG per tablet TAKE 1 TABLET BY MOUTH DAILY    metFORMIN (GLUCOPHAGE) 1000 MG tablet TAKE 1 TABLET BY MOUTH TWICE  DAILY WITH MEALS    ascorbic acid (VITAMIN  C) 500 MG tablet Take 1 tablet by mouth daily    Aspirin (VAZALORE) 81 MG CAPS aspirin 81 mg capsule   Take 1 capsule every day by oral route.    vitamin D 25 MCG (1000 UT) CAPS Take 1 capsule by mouth daily    cyanocobalamin 1000 MCG tablet Take 1 tablet by mouth daily     No current facility-administered medications for this visit.     No Known Allergies  Review of Systems:  - Cardiovascular: no chest pain  - Neurological: no tremors  - Integumentary: skin is normal    Physical Examination:  Blood pressure (!) 140/74, pulse 80, height 1.626 m (5' 4"$ ), weight 82.6 kg (182 lb 3.2 oz).  General: pleasant, no distress, good eye contact   Neck: no thyromegaly or thyroid bruits  Cardiovascular: regular, normal rate, nl s1 and s2, no m/r/g   Integumentary: skin is normal, no edema  Neurological: reflexes 2+ at biceps, no tremors  Psychiatric: normal mood and affect    Data Reviewed:   Component      Latest Ref Rng 08/20/2022   Glucose, Random      70 - 99 mg/dL 105 (H)    BUN,BUNPL      8 -  27 mg/dL 15    Creatinine      0.57 - 1.00 mg/dL 0.97    Est, Glomerular Filtration Rate      >59 mL/min/1.73 58 (L)    BUN/Creatinine Ratio      12 - 28  15    Sodium      134 - 144 mmol/L 141    Potassium      3.5 - 5.2 mmol/L 4.2    Chloride      96 - 106 mmol/L 103    CO2      20 - 29 mmol/L 23    CALCIUM, SERUM, 500694      8.7 - 10.3 mg/dL 10.5 (H)    Phosphorus      3.0 - 4.3 mg/dL 3.4    Albumin      3.7 - 4.7 g/dL 4.4    PTH      15 - 65 pg/mL 37    Vit D, 25-Hydroxy      30.0 - 100.0 ng/mL 39.6      EXAM:  [place] HEAD NECK SOFT TISSUE THYROID     INDICATION:  Thyroid nodule     COMPARISON:  Ultrasound 08/16/2021     TECHNIQUE:  Real time and color Doppler ultrasound of the thyroid gland.     FINDINGS:  The right thyroid lobe measures 4.7 x 2.1 x 2.1 cm. The left thyroid  lobe measures 6.0 x 2.6 x 2.3 cm. The isthmus measures 0.6 cm in AP thickness.     The thyroid gland is diffusely enlarged with normal heterogeneity and  vascularity.     Right thyroid nodule: Heterogeneous solid nodule measuring 2.9 x 1.9 x 1.2 cm,  previously 2.7 x 1.8 x 1.2 cm.     Left thyroid nodule: Heterogeneous solid nodule measuring 4.4 x 3.0 x 2.7 cm,  previously 4.4 x 3.6 x 2.3 cm.     There are no enlarged lymph nodes in the right or left neck.     IMPRESSION:  Stable multinodular goiter.        Assessment/Plan:   1) Hypercalcemia > As her Vitamin D improved, her PTH decreased and her Ca levels have also decreased.  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 Furthermore she is still taking HCTZ and that will cause her to hold on to excess calcium. Her Ca in February 2024 was down to 10.5, her PTH was normal at 39, her Vitamin D was   39.6. These again point to normal parathyroid function and a the high Ca due to her HCTZ use.    2) Hyperparathyroidism > See #1.    3) Hypovitaminosis D > Pt to continue the Vitamin D 1000 units daily. Her Vitamin D in February 2024 was 39.6.     4) Thyroid Nodule > She had benign FNAs of the  dominate right and dominate left thyroid nodules in March 2022. Her repeat thyroid US in February 2023 was unchanged from February 2022. Her [place] in February 2024 was unchanged from February 2023.  She will need another [place] in February 2025.      Pt voices understanding and agreement with the plan.  Pain noted and pt was recommended to call her PCP for further evaluation and treatment, as needed    RTC 1 year.        Copy sent to:  Dr. Felicie Morn

## 2022-09-27 ENCOUNTER — Ambulatory Visit: Admit: 2022-09-27 | Discharge: 2022-10-04 | Payer: MEDICARE | Primary: Internal Medicine

## 2022-09-27 ENCOUNTER — Ambulatory Visit
Admit: 2022-09-27 | Discharge: 2022-09-27 | Payer: MEDICARE | Attending: Adult Reconstructive Orthopaedic Surgery | Primary: Internal Medicine

## 2022-09-27 DIAGNOSIS — M25562 Pain in left knee: Secondary | ICD-10-CM

## 2022-09-27 NOTE — Progress Notes (Signed)
09/27/2022    Chief Complaint: Follow up for bilateral knee replacement    HPI: Ms.Stockley is a 82 y.o.  female who is now several years status post bilateral total knee arthroplasty.  The patient states that they are doing well.  The preoperative pain is gone and the postoperative pain is minimal.  The pain is minimal in intensity.  Regular exercises have helped with residual symptoms.    She did fall recently with a feeling of instability on the right knee after.  Fall was two months ago, no management since.      Past Medical History:   Diagnosis Date    Arthritis     Diabetes ([place])     Hypercalcemia     Hypercholesterolemia     Hyperparathyroidism ([place])     Hypertension     Menopause     Sleep apnea     cpap    Vertigo     past hx       Past Surgical History:   Procedure Laterality Date    BREAST BIOPSY Left 1984    benign    COLONOSCOPY N/A 09/08/2021    COLONOSCOPY performed by Eugenie Filler., MD at [facility]    OTHER SURGICAL HISTORY      colonoscopy    TOTAL KNEE ARTHROPLASTY Right 2002    TOTAL KNEE ARTHROPLASTY         Current Outpatient Medications on File Prior to Visit   Medication Sig Dispense Refill    simvastatin (ZOCOR) 20 MG tablet TAKE 1 TABLET BY MOUTH NIGHTLY 90 tablet 3    valsartan-hydroCHLOROthiazide (DIOVAN-HCT) 80-12.5 MG per tablet TAKE 1 TABLET BY MOUTH DAILY 90 tablet 3    metFORMIN (GLUCOPHAGE) 1000 MG tablet TAKE 1 TABLET BY MOUTH TWICE  DAILY WITH MEALS 180 tablet 3    ascorbic acid (VITAMIN C) 500 MG tablet Take 1 tablet by mouth daily      Aspirin (VAZALORE) 81 MG CAPS aspirin 81 mg capsule   Take 1 capsule every day by oral route.      vitamin D 25 MCG (1000 UT) CAPS Take 1 capsule by mouth daily      cyanocobalamin 1000 MCG tablet Take 1 tablet by mouth daily       No current facility-administered medications on file prior to visit.       No Known Allergies    Family History   Problem Relation Age of Onset    No Known Problems Paternal Grandfather     No Known  Problems Mother         murdered    No Known Problems Paternal Grandmother     No Known Problems Maternal Grandfather     No Known Problems Maternal Grandmother     Cancer Niece         x2    Ovarian Cancer Niece     Heart Disease Brother         has pacemaker    Diabetes Brother     Heart Defect Brother         defibrilattor    Stroke Father     Hypertension Father     Diabetes Sister     Hypertension Sister     Hypertension Brother        Social History     Socioeconomic History    Marital status: Married     Spouse name: None    Number of children: None  Years of education: None    Highest education level: None   Tobacco Use    Smoking status: Never    Smokeless tobacco: Never   Substance and Sexual Activity    Alcohol use: Yes     Alcohol/week: 1.0 standard drink of alcohol    Drug use: Never    Sexual activity: Not Currently     Partners: Male     Social Determinants of Health     Financial Resource Strain: Low Risk  (04/09/2022)    Overall Financial Resource Strain (CARDIA)     Difficulty of Paying Living Expenses: Not hard at all   Transportation Needs: Unknown (04/09/2022)    PRAPARE - Transportation     Lack of Transportation (Non-Medical): No   Physical Activity: Sufficiently Active (04/09/2022)    Exercise Vital Sign     Days of Exercise per Week: 4 days     Minutes of Exercise per Session: 40 min   Housing Stability: Unknown (04/09/2022)    Housing Stability Vital Sign     Unstable Housing in the Last Year: No         Review of Systems:       General: Denies headache, lethargy, fever, weight loss  Ears/Nose/Throat: Denies ear discharge, drainage, nosebleeds, hoarse voice, dental problems  Cardiovascular: Denies chest pain, shortness of breath  Lungs: Denies chest pain, breathing problems, wheezing, pneumonia  Stomach: Denies stomach pain, heartburn, constipation, irritable bowel  Skin: Denies rash, sores, open wounds  Musculoskeletal: no significant issues   Genitourinary: Denies dysuria, hematuria,  polyuria  Gastrointestinal: Denies constipation, obstipation, diarrhea  Neurological: Denies changes in sight, smell, hearing, taste, seizures. Denies loss of consciousness.  Psychiatric: Denies depression, sleep pattern changes, anxiety, change in personality  Endocrine: Denies mood swings, heat or cold intolerance  Hematologic/Lymphatic: Denies anemia, purpura, petechia  Allergic/Immunologic: Denies swelling of throat, pain or swelling at lymph nodes      Physical Examination:    Vitals:    09/27/22 1002   BP: 139/76   Pulse:    Resp:    Temp:    SpO2:         General: AOX3, no apparent distress  Psychiatric: mood and affect appropriate  Lungs: breathing is symmetric and unlabored bilaterally  Heart: regular rate and rhythm  Abdomen: no guarding  Head: normocephalic, atraumatic  Skin: No significant abnormalities, good turgor  Sensation intact to light touch: L1-S1 dermatomes  Muscular exam: 5/5 strength in all major muscle groups unless noted in specialty exam.    Extremities:      Left upper extremity: Full active and passive range of motion without pain, deformity, no open wound, strength 5/5 in all major muscle groups.    Right upper extremity: Full active and passive range of motion without pain, deformity, no open wound, strength 5/5 in all major muscle groups.    Left lower extremity: Well healed anterior scar is noted.  Patient ambulates with no device and no limp.  No deformity is noted.  Range of motion of the knee is 0-125.   Ligamentous testing of the knee indicates stability of the the MCL and LCL.   Coronal plane stability throughout the range of motion is excellent.   No joint line tenderness to palpation medially or laterally.  Popliteal area is unremarkable.   Negative for effusion. No patellar crepitus.  Patella tracks centrally.  Strength testing is indicative of 5/5 strength at hip flexion, extension, knee flexion and extension, tibialis anterior, EHL,  and FHL.  Sensation is intact to light  touch in the L1-S1 dermatomes with the exception of the infrapatellar branch of the saphenous.  Capillary refill is less than 2 seconds distally.    Right lower extremity:  Well healed anterior scar is noted.  Patient ambulates with no device and no limp.  No deformity is noted.  Range of motion of the knee is 0-125.   Ligamentous testing of the knee indicates 5 mm instability of the the MCL and LCL.   Coronal plane stability throughout the range of motion is excellent.   No joint line tenderness to palpation medially or laterally.  Popliteal area is unremarkable.   Negative for effusion. No patellar crepitus.  Patella tracks centrally.  Strength testing is indicative of 5/5 strength at hip flexion, extension, knee flexion and extension, tibialis anterior, EHL, and FHL.  Sensation is intact to light touch in the L1-S1 dermatomes with the exception of the infrapatellar branch of the saphenous.  Capillary refill is less than 2 seconds distally.    Diagnostics:    Pertinent Xrays:     Xrays ordered and reviewed by myself of the bilateral knee indicate a total knee arthroplasty in good alignment without evidence of loosening or failure.  No other abnormalities.         Assessment: Aftercare, status post bilateral total knee arthroplasty    Plan:  Ms.Vandehei will continue physical therapy exercises until full range of motion and strength are obtained.  We discussed the importance of continued vigilance to this end.  We also discussed dental prophylaxis and safe activities and reasonable life choices regarding activity level.  Patient stated their understanding.  Deep venous thrombosis can be discontinued once activity level is adequate, and pain control for now will be as needed only, and will be patient directed.       Ms.Willenbring will follow up in 2 years.  bilateral knee x-rays on follow up.        Ms. Havron has a reminder for a "due or due soon" health maintenance. I have asked that she contact her primary care  provider for follow-up on this health maintenance.

## 2022-09-27 NOTE — Progress Notes (Signed)
Identified pt with two pt identifiers (name and DOB). Reviewed chart in preparation for visit and have obtained necessary documentation.    Wendy Ali is a 82 y.o. female Knee Pain (F/u b/l knee pain from fall)  .    Vitals:    09/27/22 1001 09/27/22 1002   BP: (!) 160/79 139/76   Site: Right Upper Arm Right Upper Arm   Position: Sitting Sitting   Cuff Size: Large Adult Large Adult   Pulse: 92    Resp: 18    Temp: 97.5 F (36.4 C)    TempSrc: Oral    SpO2: 98%    Weight: 79.8 kg (176 lb)    Height: 1.626 m (5\' 4" )           1. Have you been to the ER, urgent care clinic since your last visit?  Hospitalized since your last visit?  no     2. Have you seen or consulted any other health care providers outside of the [place] since your last visit?  Include any pap smears or colon screening.  no

## 2022-09-28 ENCOUNTER — Ambulatory Visit: Payer: MEDICARE | Attending: Adult Reconstructive Orthopaedic Surgery | Primary: Internal Medicine

## 2022-12-24 ENCOUNTER — Encounter

## 2023-01-14 ENCOUNTER — Encounter: Payer: MEDICARE | Attending: Internal Medicine | Primary: Internal Medicine

## 2023-01-15 ENCOUNTER — Inpatient Hospital Stay: Admit: 2023-01-15 | Payer: MEDICARE | Attending: Internal Medicine | Primary: Internal Medicine

## 2023-01-15 DIAGNOSIS — Z1231 Encounter for screening mammogram for malignant neoplasm of breast: Secondary | ICD-10-CM

## 2023-01-22 ENCOUNTER — Ambulatory Visit: Admit: 2023-01-22 | Discharge: 2023-01-22 | Payer: MEDICARE | Attending: Internal Medicine | Primary: Internal Medicine

## 2023-01-22 ENCOUNTER — Encounter

## 2023-01-22 DIAGNOSIS — Z0000 Encounter for general adult medical examination without abnormal findings: Secondary | ICD-10-CM

## 2023-01-22 MED ORDER — LIDOCAINE 4 % EX PTCH
4 % | MEDICATED_PATCH | Freq: Every day | CUTANEOUS | 0 refills | Status: AC
Start: 2023-01-22 — End: 2023-02-21

## 2023-01-22 NOTE — Progress Notes (Signed)
After obtaining informed consent, the immunization is given by s.Tyrel Lex,lpn. Patient remain in exam room after injection. Patient have no side effects.

## 2023-01-22 NOTE — Patient Instructions (Signed)
A Healthy Heart: Care Instructions  Overview     Coronary artery disease, also called heart disease, occurs when a substance called plaque builds up in the vessels that supply oxygen-rich blood to your heart muscle. This can narrow the blood vessels and reduce blood flow. A heart attack happens when blood flow is completely blocked. A high-fat diet, smoking, and other factors increase the risk of heart disease.  Your doctor has found that you have a chance of having heart disease. A heart-healthy lifestyle can help keep your heart healthy and prevent heart disease. This lifestyle includes eating healthy, being active, staying at a weight that's healthy for you, and not smoking or using tobacco. It also includes taking medicines as directed, managing other health conditions, and trying to get a healthy amount of sleep.  Follow-up care is a key part of your treatment and safety. Be sure to make and go to all appointments, and call your doctor if you are having problems. It's also a good idea to know your test results and keep a list of the medicines you take.  How can you care for yourself at home?  Diet   Use less salt when you cook and eat. This helps lower your blood pressure. Taste food before salting. Add only a little salt when you think you need it. With time, your taste buds will adjust to less salt.    Eat fewer snack items, fast foods, canned soups, and other high-salt, high-fat, processed foods.    Read food labels and try to avoid saturated and trans fats. They increase your risk of heart disease by raising cholesterol levels.    Limit the amount of solid fat--butter, margarine, and shortening--you eat. Use olive, peanut, or canola oil when you cook. Bake, broil, and steam foods instead of frying them.    Eat a variety of fruit and vegetables every day. Dark green, deep orange, red, or yellow fruits and vegetables are especially good for you. Examples include spinach, carrots, peaches, and  berries.    Foods high in fiber can reduce your cholesterol and provide important vitamins and minerals. High-fiber foods include whole-grain cereals and breads, oatmeal, beans, brown rice, citrus fruits, and apples.    Eat lean proteins. Heart-healthy proteins include seafood, lean meats and poultry, eggs, beans, peas, nuts, seeds, and soy products.    Limit drinks and foods with added sugar. These include candy, desserts, and soda pop.   Heart-healthy lifestyle   If your doctor recommends it, get more exercise. For many people, walking is a good choice. Or you may want to swim, bike, or do other activities. Bit by bit, increase the time you're active every day. Try for at least 30 minutes on most days of the week.    Try to quit or cut back on using tobacco and other nicotine products. This includes smoking and vaping. If you need help quitting, talk to your doctor about stop-smoking programs and medicines. These can increase your chances of quitting for good. Quitting is one of the most important things you can do to protect your heart. It is never too late to quit. Try to avoid secondhand smoke too.    Stay at a weight that's healthy for you. Talk to your doctor if you need help losing weight.    Try to get 7 to 9 hours of sleep each night.    Limit alcohol to 2 drinks a day for men and 1 drink a day for women. Too  much alcohol can cause health problems.    Manage other health problems such as diabetes, high blood pressure, and high cholesterol. If you think you may have a problem with alcohol or drug use, talk to your doctor.   Medicines   Take your medicines exactly as prescribed. Call your doctor if you think you are having a problem with your medicine.    If your doctor recommends aspirin, take the amount directed each day. Make sure you take aspirin and not another kind of pain reliever, such as acetaminophen (Tylenol).   When should you call for help?   Call 911 if you have symptoms of a heart  attack. These may include:   Chest pain or pressure, or a strange feeling in the chest.    Sweating.    Shortness of breath.    Pain, pressure, or a strange feeling in the back, neck, jaw, or upper belly or in one or both shoulders or arms.    Lightheadedness or sudden weakness.    A fast or irregular heartbeat.   After you call 911, the operator may tell you to chew 1 adult-strength or 2 to 4 low-dose aspirin. Wait for an ambulance. Do not try to drive yourself.  Watch closely for changes in your health, and be sure to contact your doctor if you have any problems.  Where can you learn more?  Go to [employer] and enter F075 to learn more about "A Healthy Heart: Care Instructions."  Current as of: January 06, 2022  Content Version: 14.1   2006-2024 [employer].   Care instructions adapted under license by [facility]. If you have questions about a medical condition or this instruction, always ask your healthcare professional. [employer] disclaims any warranty or liability for your use of this information.      Personalized Preventive Plan for Wendy Ali - 01/22/2023  Medicare offers a range of preventive health benefits. Some of the tests and screenings are paid in full while other may be subject to a deductible, co-insurance, and/or copay.    Some of these benefits include a comprehensive review of your medical history including lifestyle, illnesses that may run in your family, and various assessments and screenings as appropriate.    After reviewing your medical record and screening and assessments performed today your provider may have ordered immunizations, labs, imaging, and/or referrals for you.  A list of these orders (if applicable) as well as your Preventive Care list are included within your After Visit Summary for your review.    Other Preventive Recommendations:    A preventive eye exam performed by an eye specialist is recommended every 1-2  years to screen for glaucoma; cataracts, macular degeneration, and other eye disorders.  A preventive dental visit is recommended every 6 months.  Try to get at least 150 minutes of exercise per week or 10,000 steps per day on a pedometer .  Order or download the FREE "Exercise & Physical Activity: Your Everyday Guide" from [employer]. Call 660-309-3295 or search [employer] online.  You need 1200-1500 mg of calcium and 1000-2000 IU of vitamin D per day. It is possible to meet your calcium requirement with diet alone, but a vitamin D supplement is usually necessary to meet this goal.  When exposed to the sun, use a sunscreen that protects against both UVA and UVB radiation with an SPF of 30 or greater. Reapply every 2 to 3 hours or after  sweating, drying off with a towel, or swimming.  Always wear a seat belt when traveling in a car. Always wear a helmet when riding a bicycle or motorcycle.

## 2023-01-22 NOTE — Progress Notes (Signed)
Medicare Annual Wellness Visit    Wendy Ali is here for Neck Pain, Immunizations, and Medicare AWV    Assessment & Plan   Wendy Ali was seen today for neck pain, immunizations and medicare awv.    Diagnoses and all orders for this visit:    Medicare annual wellness visit, subsequent  -     Comprehensive Metabolic Panel; Future  -     CBC with Auto Differential; Future    Need for shingles vaccine  -     Zoster, SHINGRIX, (18 yrs +), IM    Essential (primary) hypertension  -     Comprehensive Metabolic Panel; Future  -     CBC with Auto Differential; Future    Type 2 diabetes mellitus with diabetic nephropathy, with long-term current use of insulin (HCC)  -     Comprehensive Metabolic Panel; Future  -     CBC with Auto Differential; Future  -     Hemoglobin A1C; Future  -     Microalbumin / Creatinine Urine Ratio; Future    Stage 3a chronic kidney disease (HCC)  -     Comprehensive Metabolic Panel; Future    Hypothyroidism, unspecified type  -     TSH; Future    Dyslipidemia (high LDL; low HDL)  -     Lipid Panel; Future    Acute cystitis without hematuria  -     Urinalysis with Reflex to Culture; Future    Vitamin D deficiency  -     Vitamin D 25 Hydroxy; Future    Neck pain, musculoskeletal  -     lidocaine 4 % external patch; Place 1 patch onto the skin daily      Recommendations for Preventive Services Due: see orders and patient instructions/AVS.  Recommended screening schedule for the next 5-10 years is provided to the patient in written form: see Patient Instructions/AVS.     Return in 3 months (on 04/24/2023).     Subjective   Wendy Ali is a 82 y.o. AA female with significant medical history below presents for medicare wellness.  Blood pressure is at goal. Patient has complaint of neck pain on right side. Denies injury, swelling.    Past Medical History:   Diagnosis Date    Arthritis     Diabetes (HCC)     Hypercalcemia     Hypercholesterolemia     Hyperparathyroidism (HCC)     Hypertension      Menopause     Sleep apnea     cpap    Vertigo     past hx     Patient's complete Health Risk Assessment and screening values have been reviewed and are found in Flowsheets. The following problems were reviewed today and where indicated follow up appointments were made and/or referrals ordered.    No Positive Risk Factors identified today.                            Objective   Vitals:    01/22/23 0807   BP: 137/71   Pulse: 76   Resp: 20   Temp: 97.5 F (36.4 C)   TempSrc: Temporal   SpO2: 100%   Weight: 78.9 kg (174 lb)   Height: 1.626 m (5\' 4" )      Body mass index is 29.87 kg/m.        General Appearance: alert and oriented to person, place and time  Neck: neck supple and non tender without mass        No Known Allergies  Prior to Visit Medications    Medication Sig Taking? Authorizing Provider   simvastatin (ZOCOR) 20 MG tablet TAKE 1 TABLET BY MOUTH NIGHTLY Yes Satia Winger, Leota Sauers, MD   valsartan-hydroCHLOROthiazide (DIOVAN-HCT) 80-12.5 MG per tablet TAKE 1 TABLET BY MOUTH DAILY Yes Michon Kaczmarek, Leota Sauers, MD   metFORMIN (GLUCOPHAGE) 1000 MG tablet TAKE 1 TABLET BY MOUTH TWICE  DAILY WITH MEALS Yes Cashay Manganelli, Leota Sauers, MD   ascorbic acid (VITAMIN C) 500 MG tablet Take 1 tablet by mouth daily Yes Automatic Reconciliation, Ar   Aspirin (VAZALORE) 81 MG CAPS aspirin 81 mg capsule   Take 1 capsule every day by oral route. Yes Automatic Reconciliation, Ar   vitamin D 25 MCG (1000 UT) CAPS Take 1 capsule by mouth daily Yes Automatic Reconciliation, Ar   cyanocobalamin 1000 MCG tablet Take 1 tablet by mouth daily Yes Automatic Reconciliation, Ar       CareTeam (Including outside providers/suppliers regularly involved in providing care):   Patient Care Team:  Jamarquis Crull, Leota Sauers, MD as PCP - General (Internal Medicine)  Searcy Miyoshi, Leota Sauers, MD as PCP - Empaneled Provider  Ayala-Sims, Enid Derry, MD as Physician  Shayne Alken, MD as Physician  Ernest Mallick, MD (General Surgery)     Reviewed and updated this  visit:  Tobacco  Allergies  Meds  Problems  Med Hx  Surg Hx  Soc Hx  Fam Hx

## 2023-01-23 LAB — CULTURE, URINE: Colony count: 12000

## 2023-05-02 ENCOUNTER — Ambulatory Visit: Admit: 2023-05-02 | Discharge: 2023-05-02 | Payer: MEDICARE | Attending: Internal Medicine | Primary: Internal Medicine

## 2023-05-02 DIAGNOSIS — I10 Essential (primary) hypertension: Secondary | ICD-10-CM

## 2023-05-02 DIAGNOSIS — U071 COVID-19: Secondary | ICD-10-CM

## 2023-05-02 LAB — AMB POC COVID-19 COV: SARS-COV-2 RNA, POC: POSITIVE

## 2023-05-02 MED ORDER — NITROFURANTOIN MONOHYD MACRO 100 MG PO CAPS
100 | ORAL_CAPSULE | Freq: Two times a day (BID) | ORAL | 0 refills | Status: AC
Start: 2023-05-02 — End: 2023-05-09

## 2023-05-02 MED ORDER — PROMETHAZINE-DM 6.25-15 MG/5ML PO SYRP
Freq: Four times a day (QID) | ORAL | 0 refills | Status: AC | PRN
Start: 2023-05-02 — End: 2023-05-09

## 2023-05-02 MED ORDER — NIRMATRELVIR&RITONAVIR 300/100 20 X 150 MG & 10 X 100MG PO TBPK
20 | ORAL_TABLET | ORAL | 0 refills | Status: AC
Start: 2023-05-02 — End: 2023-05-07

## 2023-05-02 MED ORDER — VALSARTAN-HYDROCHLOROTHIAZIDE 80-12.5 MG PO TABS
ORAL_TABLET | Freq: Two times a day (BID) | ORAL | 3 refills | Status: DC
Start: 2023-05-02 — End: 2023-05-27

## 2023-05-02 MED ORDER — VALSARTAN-HYDROCHLOROTHIAZIDE 80-12.5 MG PO TABS
ORAL_TABLET | Freq: Every day | ORAL | 3 refills | Status: DC
Start: 2023-05-02 — End: 2023-05-02

## 2023-05-02 NOTE — Progress Notes (Signed)
Chief Complaint   Patient presents with    Follow-up       "Have you been to the ER, urgent care clinic since your last visit?  Hospitalized since your last visit?"    NO    "Have you seen or consulted any other health care providers outside of [facility] since your last visit?"    NO            Click Here for Release of Records Request       There were no vitals filed for this visit.   Health Maintenance Due   Topic Date Due    DTaP/Tdap/Td vaccine (1 - Tdap) Never done    Respiratory Syncytial Virus (RSV) Pregnant or age 2 yrs+ (1 - 1-dose 60+ series) Never done    Pneumococcal 65+ years Vaccine (2 of 2 - PCV) 09/12/2022    Flu vaccine (1) 02/14/2023    Shingles vaccine (2 of 2) 03/19/2023        The patient, Wendy Ali, identity was verified by name and DOB.       After obtaining informed consent, the immunization is given by C. Christy Ehrsam LPNB, per verbal order from Dr. Raiford Simmonds. Observed for 15 min, no reaction.Marland Kitchen

## 2023-05-02 NOTE — Progress Notes (Unsigned)
Wendy Ali is a 82 y.o. female and presents with Follow-up  .           Review of Systems  As per hpi    Past Medical History:   Diagnosis Date    Arthritis     Diabetes (HCC)     Hypercalcemia     Hypercholesterolemia     Hyperparathyroidism (HCC)     Hypertension     Menopause     Sleep apnea     cpap    Vertigo     past hx     Past Surgical History:   Procedure Laterality Date    BREAST BIOPSY Left 1984    benign    COLONOSCOPY N/A 09/08/2021    COLONOSCOPY performed by Herbert Moors., MD at [facility]    OTHER SURGICAL HISTORY      colonoscopy    TOTAL KNEE ARTHROPLASTY Right 2002    TOTAL KNEE ARTHROPLASTY       Social History     Socioeconomic History    Marital status: Married     Spouse name: None    Number of children: None    Years of education: None    Highest education level: None   Tobacco Use    Smoking status: Never    Smokeless tobacco: Never   Substance and Sexual Activity    Alcohol use: Yes     Alcohol/week: 1.0 standard drink of alcohol    Drug use: Never    Sexual activity: Not Currently     Partners: Male     Social Determinants of Health     Financial Resource Strain: Low Risk  (05/02/2023)    Overall Financial Resource Strain (CARDIA)     Difficulty of Paying Living Expenses: Not hard at all   Food Insecurity: No Food Insecurity (05/02/2023)    Hunger Vital Sign     Worried About Running Out of Food in the Last Year: Never true     Ran Out of Food in the Last Year: Never true   Transportation Needs: Unknown (05/02/2023)    PRAPARE - Transportation     Lack of Transportation (Non-Medical): No   Physical Activity: Sufficiently Active (01/22/2023)    Exercise Vital Sign     Days of Exercise per Week: 3 days     Minutes of Exercise per Session: 60 min   Housing Stability: Unknown (05/02/2023)    Housing Stability Vital Sign     Homeless in the Last Year: No     Family History   Problem Relation Age of Onset    No Known Problems Paternal Grandfather     No Known Problems  Mother         murdered    No Known Problems Paternal Grandmother     No Known Problems Maternal Grandfather     No Known Problems Maternal Grandmother     Cancer Niece         x2    Ovarian Cancer Niece     Heart Disease Brother         has pacemaker    Diabetes Brother     Heart Defect Brother         defibrilattor    Stroke Father     Hypertension Father     Diabetes Sister     Hypertension Sister     Hypertension Brother      Current Outpatient Medications   Medication Sig  Dispense Refill    simvastatin (ZOCOR) 20 MG tablet TAKE 1 TABLET BY MOUTH NIGHTLY 90 tablet 3    valsartan-hydroCHLOROthiazide (DIOVAN-HCT) 80-12.5 MG per tablet TAKE 1 TABLET BY MOUTH DAILY 90 tablet 3    metFORMIN (GLUCOPHAGE) 1000 MG tablet TAKE 1 TABLET BY MOUTH TWICE  DAILY WITH MEALS 180 tablet 3    ascorbic acid (VITAMIN C) 500 MG tablet Take 1 tablet by mouth daily      Aspirin (VAZALORE) 81 MG CAPS aspirin 81 mg capsule   Take 1 capsule every day by oral route.      vitamin D 25 MCG (1000 UT) CAPS Take 1 capsule by mouth daily      cyanocobalamin 1000 MCG tablet Take 1 tablet by mouth daily       No current facility-administered medications for this visit.     No Known Allergies    Objective:  BP 133/74   Pulse 73   Temp (!) 96.4 F (35.8 C) (Oral)   Resp 16   Ht 1.626 m (5\' 4" )   Wt 79.6 kg (175 lb 6.4 oz)   SpO2 100%   BMI 30.11 kg/m   Physical Exam:   General appearance - alert, well appearing, and in no distress  Mental status - alert, oriented to person, place, and time  EYE-PERLA, EOMI, fundi normal, corneas normal, no foreign bodies  ENT-ENT exam normal, no neck nodes or sinus tenderness  Nose - normal and patent, no erythema, discharge or polyps  Mouth - mucous membranes moist, pharynx normal without lesions  Neck - supple, no significant adenopathy   Chest - clear to auscultation, no wheezes, rales or rhonchi, symmetric air entry   Heart - normal rate, regular rhythm, normal S1, S2, no murmurs, rubs, clicks or  gallops   Abdomen - soft, nontender, nondistended, no masses or organomegaly  Lymph- no adenopathy palpable  Ext-peripheral pulses normal, no pedal edema, no clubbing or cyanosis  Skin-Warm and dry. no hyperpigmentation, vitiligo, or suspicious lesions  Neuro -alert, oriented, normal speech, no focal findings or movement disorder noted  Back-{back exam:315940::"full range of motion, no tenderness, palpable spasm or pain on motion"}   Knee-{knee exam:315766}   Shoulder-{shoulder exam:315773}  Neck- {neck ZOXW:960454}    Assessment/Plan:

## 2023-05-02 NOTE — Patient Instructions (Signed)
Stop taking simvastatin while on paxlovid. Resume simvastatin after completing  treatment

## 2023-05-16 ENCOUNTER — Encounter

## 2023-05-17 NOTE — Telephone Encounter (Signed)
Medication refill request.

## 2023-05-20 MED ORDER — SIMVASTATIN 20 MG PO TABS
20 MG | ORAL_TABLET | Freq: Every evening | ORAL | 0 refills | Status: DC
Start: 2023-05-20 — End: 2023-07-15

## 2023-05-24 ENCOUNTER — Encounter

## 2023-05-24 NOTE — Telephone Encounter (Signed)
Pharmacy is requesting a 90 d/s    Last appointment: 05/02/23  Next appointment: 09/03/23    Requested Prescriptions     Pending Prescriptions Disp Refills    valsartan-hydroCHLOROthiazide (DIOVAN-HCT) 80-12.5 MG per tablet [Pharmacy Med Name: VALSARTAN-HCTZ 80-12.5 MG TAB] 180 tablet 1     Sig: TAKE 1 TABLET BY MOUTH TWICE A DAY         For Pharmacy Admin Tracking Only    Program: Medication Refill  CPA in place:    Recommendation Provided To:   Intervention Detail: New Rx: 1, reason: Improve Adherence  Intervention Accepted By:   Eugenia Pancoast Closed?:    Time Spent (min): 5

## 2023-05-27 MED ORDER — VALSARTAN-HYDROCHLOROTHIAZIDE 80-12.5 MG PO TABS
80-12.5 | ORAL_TABLET | Freq: Two times a day (BID) | ORAL | 1 refills | Status: DC
Start: 2023-05-27 — End: 2023-12-13

## 2023-05-30 ENCOUNTER — Encounter

## 2023-06-01 MED ORDER — METFORMIN HCL 1000 MG PO TABS
1000 | ORAL_TABLET | Freq: Two times a day (BID) | ORAL | 3 refills | Status: DC
Start: 2023-06-01 — End: 2024-04-24

## 2023-06-25 ENCOUNTER — Encounter: Admit: 2023-06-25 | Admitting: Internal Medicine

## 2023-06-25 DIAGNOSIS — E213 Hyperparathyroidism, unspecified: Secondary | ICD-10-CM

## 2023-07-14 ENCOUNTER — Encounter

## 2023-07-15 MED ORDER — SIMVASTATIN 20 MG PO TABS
20 | ORAL_TABLET | Freq: Every evening | ORAL | 3 refills | Status: DC
Start: 2023-07-15 — End: 2024-05-31

## 2023-07-15 NOTE — Telephone Encounter (Signed)
 Forwarded to provider for approval. Last Visit: 05/02/2023 Next Visit: 09/03/2023

## 2023-08-17 ENCOUNTER — Encounter

## 2023-08-26 ENCOUNTER — Encounter: Primary: Internal Medicine

## 2023-08-27 LAB — PTH, INTACT: Pth Intact: 46 pg/mL (ref 15–65)

## 2023-08-27 LAB — TSH: TSH: 1.05 u[IU]/mL (ref 0.450–4.500)

## 2023-08-27 LAB — RENAL FUNCTION PANEL
Albumin: 4.5 g/dL (ref 3.7–4.7)
BUN/Creatinine Ratio: 20 (ref 12–28)
BUN: 18 mg/dL (ref 8–27)
CO2: 23 mmol/L (ref 20–29)
Calcium: 10.9 mg/dL — ABNORMAL HIGH (ref 8.7–10.3)
Chloride: 103 mmol/L (ref 96–106)
Creatinine: 0.89 mg/dL (ref 0.57–1.00)
Est, Glom Filt Rate: 64 mL/min/{1.73_m2} (ref >59)
Glucose: 119 mg/dL — ABNORMAL HIGH (ref 70–99)
Phosphorus: 3.5 mg/dL (ref 3.0–4.3)
Potassium: 4.6 mmol/L (ref 3.5–5.2)
Sodium: 141 mmol/L (ref 134–144)

## 2023-08-27 LAB — VITAMIN D 25 HYDROXY: Vit D, 25-Hydroxy: 48.4 ng/mL (ref 30.0–100.0)

## 2023-08-27 LAB — T4, FREE: T4 Free: 1.21 ng/dL (ref 0.82–1.77)

## 2023-08-30 NOTE — Telephone Encounter (Signed)
 Attempted to contact patient regarding upcoming Medicare wellness appointment and completion of HRA questionnaire. No answer, VM Full.

## 2023-09-02 NOTE — Telephone Encounter (Signed)
 Contacted patient regarding upcoming Medicare wellness appointment and completion of HRA questionnaire. Questionnaire completed via phone.

## 2023-09-03 ENCOUNTER — Inpatient Hospital Stay: Admit: 2023-09-03 | Payer: MEDICARE | Primary: Internal Medicine

## 2023-09-03 ENCOUNTER — Ambulatory Visit: Admit: 2023-09-03 | Discharge: 2023-09-03 | Payer: MEDICARE | Attending: Internal Medicine | Primary: Internal Medicine

## 2023-09-03 ENCOUNTER — Encounter

## 2023-09-03 VITALS — BP 131/72 | HR 85 | Temp 97.30000°F | Resp 16 | Ht 64.0 in | Wt 171.6 lb

## 2023-09-03 DIAGNOSIS — Z0000 Encounter for general adult medical examination without abnormal findings: Secondary | ICD-10-CM

## 2023-09-03 DIAGNOSIS — E042 Nontoxic multinodular goiter: Secondary | ICD-10-CM

## 2023-09-03 DIAGNOSIS — E041 Nontoxic single thyroid nodule: Secondary | ICD-10-CM

## 2023-09-03 LAB — LIPID PANEL
Chol/HDL Ratio: 1.8 (ref 0.0–5.0)
Cholesterol, Total: 149 mg/dL (ref <200)
HDL: 84 mg/dL
LDL Cholesterol: 53.6 mg/dL (ref 0–100)
Triglycerides: 57 mg/dL (ref <150)
VLDL Cholesterol Calculated: 11.4 mg/dL

## 2023-09-03 LAB — CBC WITH AUTO DIFFERENTIAL
Basophils %: 1.1 % — ABNORMAL HIGH (ref 0.0–1.0)
Basophils Absolute: 0.06 10*3/uL (ref 0.00–0.10)
Eosinophils %: 0.5 % (ref 0.0–7.0)
Eosinophils Absolute: 0.03 10*3/uL (ref 0.00–0.40)
Hematocrit: 35.2 % (ref 35.0–47.0)
Hemoglobin: 11.4 g/dL — ABNORMAL LOW (ref 11.5–16.0)
Immature Granulocytes %: 0.2 % (ref 0.0–0.5)
Immature Granulocytes Absolute: 0.01 10*3/uL (ref 0.00–0.04)
Lymphocytes %: 39.8 % (ref 12.0–49.0)
Lymphocytes Absolute: 2.27 10*3/uL (ref 0.80–3.50)
MCH: 27.3 pg (ref 26.0–34.0)
MCHC: 32.4 g/dL (ref 30.0–36.5)
MCV: 84.4 FL (ref 80.0–99.0)
MPV: 10.5 FL (ref 8.9–12.9)
Monocytes %: 6.5 % (ref 5.0–13.0)
Monocytes Absolute: 0.37 10*3/uL (ref 0.00–1.00)
Neutrophils %: 51.9 % (ref 32.0–75.0)
Neutrophils Absolute: 2.96 10*3/uL (ref 1.80–8.00)
Nucleated RBCs: 0 /100{WBCs}
Platelets: 391 10*3/uL (ref 150–400)
RBC: 4.17 M/uL (ref 3.80–5.20)
RDW: 15.5 % — ABNORMAL HIGH (ref 11.5–14.5)
WBC: 5.7 10*3/uL (ref 3.6–11.0)
nRBC: 0 10*3/uL (ref 0.00–0.01)

## 2023-09-03 LAB — URINALYSIS WITH REFLEX TO CULTURE
BACTERIA, URINE: NEGATIVE /HPF
Bilirubin, Urine: NEGATIVE
Glucose, Ur: NEGATIVE mg/dL
Ketones, Urine: NEGATIVE mg/dL
Nitrite, Urine: NEGATIVE
Protein, UA: NEGATIVE mg/dL
Specific Gravity, UA: 1.017 (ref 1.003–1.030)
Urobilinogen, Urine: 1 EU/dL (ref 0.2–1.0)
pH, Urine: 5.5 (ref 5.0–8.0)

## 2023-09-03 LAB — HEMOGLOBIN A1C
Estimated Avg Glucose: 91 mg/dL
Hemoglobin A1C: 4.8 % (ref 4.0–5.6)

## 2023-09-03 LAB — COMPREHENSIVE METABOLIC PANEL
ALT: 16 U/L (ref 12–78)
AST: 24 U/L (ref 15–37)
Albumin/Globulin Ratio: 1.1 (ref 1.1–2.2)
Albumin: 4.1 g/dL (ref 3.5–5.0)
Alk Phosphatase: 73 U/L (ref 45–117)
Anion Gap: 9 mmol/L (ref 2–12)
BUN/Creatinine Ratio: 24 — ABNORMAL HIGH (ref 12–20)
BUN: 23 mg/dL — ABNORMAL HIGH (ref 6–20)
CO2: 25 mmol/L (ref 21–32)
Calcium: 10.4 mg/dL — ABNORMAL HIGH (ref 8.5–10.1)
Chloride: 105 mmol/L (ref 97–108)
Creatinine: 0.96 mg/dL (ref 0.55–1.02)
Est, Glom Filt Rate: 58 mL/min/{1.73_m2} — ABNORMAL LOW (ref >60)
Globulin: 3.7 g/dL (ref 2.0–4.0)
Glucose: 101 mg/dL — ABNORMAL HIGH (ref 65–100)
Potassium: 4.1 mmol/L (ref 3.5–5.1)
Sodium: 139 mmol/L (ref 136–145)
Total Bilirubin: 0.6 mg/dL (ref 0.2–1.0)
Total Protein: 7.8 g/dL (ref 6.4–8.2)

## 2023-09-03 LAB — ALBUMIN/CREATININE RATIO, URINE
Albumin Urine: 3.89 mg/dL
Albumin/Creatinine Ratio: 45 mg/g — ABNORMAL HIGH (ref 0–30)
Creatinine, Ur: 85.9 mg/dL

## 2023-09-03 NOTE — Patient Instructions (Signed)
 Hearing Loss: Care Instructions  Overview     Hearing loss is a sudden or slow decrease in how well you hear. It can range from slight to profound. Permanent hearing loss can occur with aging. It also can happen when you are exposed long-term to loud noise. Examples include listening to loud music, riding motorcycles, or being around other loud machines.  Hearing loss can affect your work and home life. It can make you feel lonely or depressed. You may feel that you have lost your independence. But hearing aids and other devices can help you hear better and feel connected to others.  Follow-up care is a key part of your treatment and safety. Be sure to make and go to all appointments, and call your doctor if you are having problems. It's also a good idea to know your test results and keep a list of the medicines you take.  How can you care for yourself at home?  Avoid loud noises whenever possible. This helps keep your hearing from getting worse.  Always wear hearing protection around loud noises.  Wear a hearing aid as directed.  A professional can help you pick a hearing aid that will work best for you.  You can also get hearing aids over the counter for mild to moderate hearing loss.  Have hearing tests as your doctor suggests. They can show whether your hearing has changed. Your hearing aid may need to be adjusted.  Use other devices as needed. These may include:  Telephone amplifiers and hearing aids that can connect to a television, stereo, radio, or microphone.  Devices that use lights or vibrations. These alert you to the doorbell, a ringing telephone, or a baby monitor.  Television closed-captioning. This shows the words at the bottom of the screen. Most new TVs can do this.  TTY (text telephone). This lets you type messages back and forth on the telephone instead of talking or listening. These devices are also called TDD. When messages are typed on the keyboard, they are sent over the phone line to a  receiving TTY. The message is shown on a monitor.  Use text messaging, social media, and email if it is hard for you to communicate by telephone.  Try to learn a listening technique called speechreading. It is not lipreading. You pay attention to people's gestures, expressions, posture, and tone of voice. These clues can help you understand what a person is saying. Face the person you are talking to, and have them face you. Make sure the lighting is good. You need to see the other person's face clearly.  Think about counseling if you need help to adjust to your hearing loss.  When should you call for help?  Watch closely for changes in your health, and be sure to contact your doctor if:    You think your hearing is getting worse.     You have new symptoms, such as dizziness or nausea.   Where can you learn more?  Go to [employer] and enter R798 to learn more about "Hearing Loss: Care Instructions."  Current as of: April 11, 2022  Content Version: 14.3   [address], [place].   Care instructions adapted under license by [facility]. If you have questions about a medical condition or this instruction, always ask your healthcare professional. Larene Beach, [facility], disclaims any warranty or liability for your use of this information.         Eating Healthy Foods: Care Instructions  With  every meal, you can make healthy food choices. Try to eat a variety of fruits, vegetables, whole grains, lean proteins, and low-fat dairy products. This can help you get the right balance of nutrients, including vitamins and minerals. Small changes add up over time. You can start by adding one healthy food to your meals each day.    Try to make half your plate fruits and vegetables, one-fourth whole grains, and one-fourth lean proteins. Try including dairy with your meals.   Eat more fruits and vegetables. Try to have them with most meals and snacks.   Foods for healthy eating        Fruits    These can be fresh, frozen, canned, or dried.  Try to choose whole fruit rather than fruit juice.  Eat a variety of colors.        Vegetables   These can be fresh, frozen, canned, or dried.  Beans, peas, and lentils count too.        Whole grains   Choose whole-grain breads, cereals, and noodles.  Try brown rice.        Lean proteins   These can include lean meat, poultry, fish, and eggs.  You can also have tofu, beans, peas, lentils, nuts, and seeds.        Dairy   Try milk, yogurt, and cheese.  Choose low-fat or fat-free when you can.  If you need to, use lactose-free milk or fortified plant-based milk products, such as soy milk.        Water   Drink water when you're thirsty.  Limit sugar-sweetened drinks, including soda, fruit drinks, and sports drinks.  Where can you learn more?  Go to [employer] and enter T756 to learn more about "Eating Healthy Foods: Care Instructions."  Current as of: April 04, 2022  Content Version: 14.3   [address] [place].   Care instructions adapted under license by [facility] [place] [facility]. If you have questions about a medical condition or this instruction, always ask your healthcare professional. Larene Beach, [facility], disclaims any warranty or liability for your use of this information.         A Healthy Heart: Care Instructions  Overview     Coronary artery disease, also called heart disease, occurs when a substance called plaque builds up in the vessels that supply oxygen-rich blood to your heart muscle. This can narrow the blood vessels and reduce blood flow. A heart attack happens when blood flow is completely blocked. A high-fat diet, smoking, and other factors increase the risk of heart disease.  Your doctor has found that you have a chance of having heart disease. A heart-healthy lifestyle can help keep your heart healthy and prevent heart disease. This lifestyle includes eating healthy, being active, staying at a weight that's healthy for you,  and not smoking or using tobacco. It also includes taking medicines as directed, managing other health conditions, and trying to get a healthy amount of sleep.  Follow-up care is a key part of your treatment and safety. Be sure to make and go to all appointments, and call your doctor if you are having problems. It's also a good idea to know your test results and keep a list of the medicines you take.  How can you care for yourself at home?  Diet    Use less salt when you cook and eat. This helps lower your blood pressure. Taste food before salting. Add only a little salt when you think you need  it. With time, your taste buds will adjust to less salt.     Eat fewer snack items, fast foods, canned soups, and other high-salt, high-fat, processed foods.     Read food labels and try to avoid saturated and trans fats. They increase your risk of heart disease by raising cholesterol levels.     Limit the amount of solid fat--butter, margarine, and shortening--you eat. Use olive, peanut, or canola oil when you cook. Bake, broil, and steam foods instead of frying them.     Eat a variety of fruit and vegetables every day. Dark green, deep orange, red, or yellow fruits and vegetables are especially good for you. Examples include spinach, carrots, peaches, and berries.     Foods high in fiber can reduce your cholesterol and provide important vitamins and minerals. High-fiber foods include whole-grain cereals and breads, oatmeal, beans, brown rice, citrus fruits, and apples.     Eat lean proteins. Heart-healthy proteins include seafood, lean meats and poultry, eggs, beans, peas, nuts, seeds, and soy products.     Limit drinks and foods with added sugar. These include candy, desserts, and soda pop.   Heart-healthy lifestyle    If your doctor recommends it, get more exercise. For many people, walking is a good choice. Or you may want to swim, bike, or do other activities. Bit by bit, increase the time you're active every day. Try  for at least 30 minutes on most days of the week.     Try to quit or cut back on using tobacco and other nicotine products. This includes smoking and vaping. If you need help quitting, talk to your doctor about stop-smoking programs and medicines. These can increase your chances of quitting for good. Quitting is one of the most important things you can do to protect your heart. It is never too late to quit. Try to avoid secondhand smoke too.     Stay at a weight that's healthy for you. Talk to your doctor if you need help losing weight.     Try to get 7 to 9 hours of sleep each night.     Limit alcohol to 2 drinks a day for men and 1 drink a day for women. Too much alcohol can cause health problems.     Manage other health problems such as diabetes, high blood pressure, and high cholesterol. If you think you may have a problem with alcohol or drug use, talk to your doctor.   Medicines    Take your medicines exactly as prescribed. Call your doctor if you think you are having a problem with your medicine.     If your doctor recommends aspirin, take the amount directed each day. Make sure you take aspirin and not another kind of pain reliever, such as acetaminophen (Tylenol).   When should you call for help?   Call 911 if you have symptoms of a heart attack. These may include:    Chest pain or pressure, or a strange feeling in the chest.     Sweating.     Shortness of breath.     Pain, pressure, or a strange feeling in the back, neck, jaw, or upper belly or in one or both shoulders or arms.     Lightheadedness or sudden weakness.     A fast or irregular heartbeat.   After you call 911, the operator may tell you to chew 1 adult-strength or 2 to 4 low-dose aspirin. Wait for an ambulance. Do not  try to drive yourself.  Watch closely for changes in your health, and be sure to contact your doctor if you have any problems.  Where can you learn more?  Go to [employer] and enter F075 to learn more  about "A Healthy Heart: Care Instructions."  Current as of: February 13, 2023  Content Version: 14.3   [address], [place].   Care instructions adapted under license by [facility]. If you have questions about a medical condition or this instruction, always ask your healthcare professional. Larene Beach, [facility], disclaims any warranty or liability for your use of this information.    Personalized Preventive Plan for Wendy Ali - 09/03/2023  Medicare offers a range of preventive health benefits. Some of the tests and screenings are paid in full while other may be subject to a deductible, co-insurance, and/or copay.  Some of these benefits include a comprehensive review of your medical history including lifestyle, illnesses that may run in your family, and various assessments and screenings as appropriate.  After reviewing your medical record and screening and assessments performed today your provider may have ordered immunizations, labs, imaging, and/or referrals for you.  A list of these orders (if applicable) as well as your Preventive Care list are included within your After Visit Summary for your review.

## 2023-09-03 NOTE — Progress Notes (Signed)
 Medicare Annual Wellness Visit    Wendy Ali is here for Medicare AWV    Assessment & Plan   Assessment & Plan  Medicare annual wellness visit, subsequent   Orders:    CBC with Auto Differential; Future    Comprehensive Metabolic Panel; Future    Dyslipidemia (high LDL; low HDL)  Orders:    Lipid Panel; Future    Essential (primary) hypertension   Orders:    CBC with Auto Differential; Future    Comprehensive Metabolic Panel; Future    Type II diabetes mellitus, well controlled (HCC)   Orders:    Albumin/Creatinine Ratio, Urine; Future    Comprehensive Metabolic Panel; Future    Hemoglobin A1C; Future    Acute cystitis without hematuria   Orders:    Urinalysis with Reflex to Culture; Future    Type 2 diabetes mellitus with diabetic nephropathy, with long-term current use of insulin (HCC)   Orders:    Comprehensive Metabolic Panel; Future    Hemoglobin A1C; Future    Stage 3a chronic kidney disease (HCC)   Orders:    Comprehensive Metabolic Panel; Future        Return in 6 months (on 03/02/2024).     Subjective   Wendy Ali is a 83 y.o. female with medical history below presents for medicare wellness. Patient is doing well. Blood pressure is at goal.    Past Medical History:   Diagnosis Date    Arthritis     Diabetes (HCC)     Hypercalcemia     Hypercholesterolemia     Hyperparathyroidism (HCC)     Hypertension     Menopause     Sleep apnea     cpap    Vertigo     past hx       Patient's complete Health Risk Assessment and screening values have been reviewed and are found in Flowsheets. The following problems were reviewed today and where indicated follow up appointments were made and/or referrals ordered.        Objective   Vitals:    09/03/23 0827   BP: 131/72   Pulse: 85   Resp: 16   Temp: 97.3 F (36.3 C)   TempSrc: Temporal   SpO2: 97%   Weight: 77.8 kg (171 lb 9.6 oz)   Height: 1.626 m (5\' 4" )      Body mass index is 29.46 kg/m.               No Known Allergies  Prior to Visit Medications     Medication Sig Taking? Authorizing Provider   simvastatin (ZOCOR) 20 MG tablet TAKE 1 TABLET BY MOUTH NIGHTLY Yes Kameela Leipold, Leota Sauers, MD   metFORMIN (GLUCOPHAGE) 1000 MG tablet TAKE 1 TABLET BY MOUTH TWICE  DAILY WITH MEALS Yes Traylen Eckels, Leota Sauers, MD   valsartan-hydroCHLOROthiazide (DIOVAN-HCT) 80-12.5 MG per tablet TAKE 1 TABLET BY MOUTH TWICE A DAY Yes Abagayle Klutts, Leota Sauers, MD   ascorbic acid (VITAMIN C) 500 MG tablet Take 1 tablet by mouth daily Yes Automatic Reconciliation, Ar   Aspirin (VAZALORE) 81 MG CAPS aspirin 81 mg capsule   Take 1 capsule every day by oral route. Yes Automatic Reconciliation, Ar   vitamin D 25 MCG (1000 UT) CAPS Take 1 capsule by mouth daily Yes Automatic Reconciliation, Ar   cyanocobalamin 1000 MCG tablet Take 1 tablet by mouth daily Yes Automatic Reconciliation, Ar       CareTeam (Including outside providers/suppliers regularly involved in providing  care):   Patient Care Team:  Adeyemi Hamad, Leota Sauers, MD as PCP - General (Internal Medicine)  Shauntae Reitman, Leota Sauers, MD as PCP - Empaneled Provider  Ayala-Sims, Enid Derry, MD as Physician  Shayne Alken, MD as Physician  Ernest Mallick, MD (General Surgery)     Recommendations for Preventive Services Due: see orders and patient instructions/AVS.  Recommended screening schedule for the next 5-10 years is provided to the patient in written form: see Patient Instructions/AVS.     Reviewed and updated this visit:  Tobacco  Allergies  Meds  Med Hx  Surg Hx  Fam Hx  Sexual Hx

## 2023-09-03 NOTE — Assessment & Plan Note (Signed)
 Orders:    Comprehensive Metabolic Panel; Future

## 2023-09-03 NOTE — Progress Notes (Signed)
 Chief Complaint   Patient presents with    Medicare AWV       "Have you been to the ER, urgent care clinic since your last visit?  Hospitalized since your last visit?"    NO    "Have you seen or consulted any other health care providers outside of [facility] since your last visit?"    NO            Click Here for Release of Records Request       There were no vitals filed for this visit.   Health Maintenance Due   Topic Date Due    DTaP/Tdap/Td vaccine (1 - Tdap) Never done    Respiratory Syncytial Virus (RSV) Pregnant or age 13 yrs+ (1 - 1-dose 75+ series) Never done    Pneumococcal 50+ years Vaccine (2 of 2 - PCV) 09/12/2022    Shingles vaccine (2 of 2) 03/19/2023    Annual Wellness Visit (Medicare Advantage)  07/17/2023        The patient, Wendy Ali, identity was verified by name and DOB.

## 2023-09-04 LAB — CULTURE, URINE: Colony count: >100000

## 2023-09-05 ENCOUNTER — Telehealth: Admit: 2023-09-05 | Discharge: 2023-09-05 | Payer: MEDICARE | Attending: Internal Medicine | Primary: Internal Medicine

## 2023-09-05 DIAGNOSIS — E213 Hyperparathyroidism, unspecified: Secondary | ICD-10-CM

## 2023-09-05 NOTE — Progress Notes (Signed)
 Chief Complaint   Patient presents with    Other     Hypercalcemia      Goiter            **THIS IS A VIRTUAL VISIT VIA A TELEPHONE ENCOUNTER.  PATIENT AGREED TO HAVE THEIR CARE DELIVERED OVER THE PHONE IN PLACE OF THEIR REGULARLY SCHEDULED OFFICE VISIT**       Wendy Ali, was evaluated through a synchronous (real-time) audio-video encounter. The patient (or guardian if applicable) is aware that this is a billable service, which includes applicable co-pays. This Virtual Visit was conducted with patient's (and/or legal guardian's) consent. The visit was conducted pursuant to the emergency declaration under the [employer] and the [employer], 1135 waiver authority and the [employer] and [employer] Act. Patient identification was verified, and a caregiver was present when appropriate. The patient was located in a state where the provider was licensed to provide care.     History of Present Illness: Wendy Ali is a 83 y.o. female here for follow up of hypercalcemia and  hyperparathyroidism.    Pt presented to Dr. Raiford Simmonds in December 2020 for a new patient visit. At the time her Ca was 10.6 and her Vitamin D was 10.8 and her TSH was 1.29. Her renal function was good with Cr of 0.91.Dr. Raiford Simmonds repeated her Ca in February 2021 and at that time her Ca had declined to 10.3, but her PTH was 99.6.   She had a DXA scan and it did not show any evidence of osteoporosis or osteopenia.  She was started on Vitamin D 1000 units daily, which she had been taking for 6+ months.    At our initial visit in August 2021 I repeated her Vitamin D level which had improved to 36.3. Her 1,25-OH vitamin D was normal at 31.3 and her PTH had improved down to a normal range at 61.  Her Ca was 10.5.   Since her Ca level was under 11.2 and she is not having any symptoms or complications from the high calcium, we agreed to monitor her levels.    The records from her prior PCP in regards to  her thyroid nodule stated that pt has prior thyroid nodule with FNA in October 2015 that was read as benign. She had a repeat US in November 2016 which showed no change in thyroid nodule from 2015.  She had right thyroid nodule measuring 1.8 x 1.8 x 1.1 cm and a left sided thyroid nodule measuring 4.7 x 2.6 x 2.9 cm, which was previously biopsied and read as benign.    At our last visit in June 2023 her PTH was down to 39 as her Vitamin D had improved to 44. Her Ca was 10.8 and her 1,25-OH Vitamin D was 31.8.    I repeated her Thyroid US and it showed a left sided 1.6 x 1.5 x 1.2 cm isoechoic rounded avascular nodule centrally.  As well as a right sided 2.6 x 1.8 x 1.2 cm echogenic slightly heterogeneous irregular.   I send her for an FNA of both nodules and they came back benign.    Pt denies any recent illnesses, injuries or hospitalizations.    Pt is still taking the Valsartan-HCTZ 80/12.5mg  daily.    Her Ca in February 2024 was down to 10.5, her PTH was normal at 39, her Vitamin D was 39.6.    Pt is still taking the vitamin D 1000 units daily.  She denies renal stones, hematuria or dysuria.  No fall or fractures since our last visit and no hx of osteoporosis.  She denies issues of AMS, confusion or mood swings.  She denies any Ca our TUMS or any treatments for GERD.    Pt denies issues of dysphagia, dysphonia, voice changes or hoarseness in her voice.  She denies any changes in her neck or throat.    Current Outpatient Medications   Medication Sig    simvastatin (ZOCOR) 20 MG tablet TAKE 1 TABLET BY MOUTH NIGHTLY    metFORMIN (GLUCOPHAGE) 1000 MG tablet TAKE 1 TABLET BY MOUTH TWICE  DAILY WITH MEALS    valsartan-hydroCHLOROthiazide (DIOVAN-HCT) 80-12.5 MG per tablet TAKE 1 TABLET BY MOUTH TWICE A DAY    ascorbic acid (VITAMIN C) 500 MG tablet Take 1 tablet by mouth daily    Aspirin (VAZALORE) 81 MG CAPS aspirin 81 mg capsule   Take 1 capsule every day by oral route.    vitamin D 25 MCG (1000 UT) CAPS Take 1  capsule by mouth daily    cyanocobalamin 1000 MCG tablet Take 1 tablet by mouth daily     No current facility-administered medications for this visit.     No Known Allergies  Review of Systems:  - Cardiovascular: no chest pain  - Neurological: no tremors  - Integumentary: skin is normal    Physical Examination:  There were no vitals taken for this visit.      Data Reviewed:   EXAM: [place] HEAD NECK SOFT TISSUE      INDICATION: Follow-up thyroid nodules.     COMPARISON: 08/22/2022.     TECHNIQUE: Real-time sonography of the thyroid gland was performed with a high  frequency linear transducer. Multiple static images were obtained.     FINDINGS:  Right thyroid nodule: Heterogeneous solid nodule measuring 2.9 x 1.9 x 1.2 cm,  unchanged.     Left thyroid nodule: Heterogeneous solid nodule measuring 4.1 x 2.6 x 3.2 cm,  unchanged.     Bilateral cervical nodal survey was performed, with cine images of the bilateral  neck, from submandibular glands to clavicles. No cervical lymphadenopathy is  shown.        The right lobe measures 4.5 x 2.1 x 1.9 cm and the left lobe measures 5.7 x 2.8  x 2.5 cm. The isthmus measures 0.5 cm.     IMPRESSION:  Stable bilateral thyroid nodules.    Component      Latest Ref Rng 08/26/2023   Glucose      70 - 99 mg/dL 161 (H)    BUN,BUNPL      8 - 27 mg/dL 18    Creatinine      0.96 - 1.00 mg/dL 0.45    Est, Glom Filt Rate      >59 mL/min/1.73 64    Bun/Cre      12 - 28  20    Sodium      134 - 144 mmol/L 141    Potassium      3.5 - 5.2 mmol/L 4.6    Chloride      96 - 106 mmol/L 103    CARBON DIOXIDE      20 - 29 mmol/L 23    Calcium      8.7 - 10.3 mg/dL 40.9 (H)    Phosphorus      3.0 - 4.3 mg/dL 3.5    Albumin      3.7 - 4.7 g/dL  4.5    TSH, 3rd Generation      0.450 - 4.500 uIU/mL 1.050    T4 Free      0.82 - 1.77 ng/dL 1.61    Parathyroid Hormone      15 - 65 pg/mL 46    Vit D, 25-Hydroxy      30.0 - 100.0 ng/mL 48.4      Assessment/Plan:   1) Hypercalcemia > As her Vitamin D improved, her PTH  decreased and her Ca levels have also decreased.  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 Furthermore she is still taking HCTZ and that will cause her to hold on to excess calcium. Her Ca in February 2024 was down to 10.5, her PTH was normal at 39, her Vitamin D was   39.6. These again point to normal parathyroid function and a the high Ca due to her HCTZ use.    2) Hyperparathyroidism > See #1.    3) Hypovitaminosis D > Pt to continue the Vitamin D 1000 units daily. Her Vitamin D in February 2025 was 48.4.    4) Thyroid Nodule > She had benign FNAs of the dominate right and dominate left thyroid nodules in March 2022. Her repeat thyroid US in February 2023 was unchanged from February 2022. Her [place] in February 2024 was unchanged from February 2023. Her [place] in February 2025 was unchanged from February 2024.   No further [place] needed, unless she has a change in her clinical picture.      Pt voices understanding and agreement with the plan.  Pain noted and pt was recommended to call her PCP for further evaluation and treatment, as needed    No further follow ups needed at this time.    Maliaka R Suppes, was evaluated through a synchronous (real-time) audio-video encounter. The patient (or guardian if applicable) is aware that this is a billable service, which includes applicable co-pays. This Virtual Visit was conducted with patient's (and/or legal guardian's) consent. Patient identification was verified, and a caregiver was present when appropriate.   The patient was located at Home: [address]  [place] [address]  Provider was located at [employer] (Appt Dept): [address]  [place],  [place] [address]  Confirm you are appropriately licensed, registered, or certified to deliver care in the state where the patient is located as indicated above. If you are not or unsure, please re-schedule the visit: Yes, I confirm.      Total time spent for this encounter:  11 minutes    --Renaldo Harrison, MD on 09/05/2023 at 8:52 AM    An electronic signature was used to authenticate this note.      Copy sent to:  Dr. Oneida Arenas

## 2023-12-13 ENCOUNTER — Encounter

## 2023-12-13 NOTE — Telephone Encounter (Signed)
 Pt would like a refill     Valsartan      [facility] mail order     Best number to reach her is [phone]

## 2023-12-15 MED ORDER — VALSARTAN-HYDROCHLOROTHIAZIDE 80-12.5 MG PO TABS
80-12.5 | ORAL_TABLET | Freq: Two times a day (BID) | ORAL | 1 refills | 90.00000 days | Status: DC
Start: 2023-12-15 — End: 2024-01-27

## 2023-12-26 ENCOUNTER — Encounter

## 2024-01-01 ENCOUNTER — Ambulatory Visit: Payer: Medicare (Managed Care) | Attending: Internal Medicine | Primary: Internal Medicine

## 2024-01-27 ENCOUNTER — Ambulatory Visit
Admit: 2024-01-27 | Discharge: 2024-01-27 | Payer: Medicare (Managed Care) | Attending: Internal Medicine | Primary: Internal Medicine

## 2024-01-27 VITALS — BP 127/67 | HR 80 | Temp 97.10000°F | Resp 18 | Ht 64.0 in | Wt 166.6 lb

## 2024-01-27 DIAGNOSIS — I10 Essential (primary) hypertension: Principal | ICD-10-CM

## 2024-01-27 LAB — AMB POC URINALYSIS DIP STICK AUTO W/ MICRO
Bilirubin, Urine, POC: NEGATIVE
Glucose, Urine, POC: NEGATIVE
Ketones, Urine, POC: NEGATIVE
Nitrite, Urine, POC: NEGATIVE
Protein, Urine, POC: NEGATIVE
Specific Gravity, Urine, POC: 1.02 (ref 1.001–1.035)
Urobilinogen, POC: NORMAL mg/dL (ref <1.1)
pH, Urine, POC: 6 (ref 4.6–8.0)

## 2024-01-27 MED ORDER — CIPROFLOXACIN HCL 500 MG PO TABS
500 | ORAL_TABLET | Freq: Two times a day (BID) | ORAL | 0 refills | 7.00000 days | Status: AC
Start: 2024-01-27 — End: 2024-02-03

## 2024-01-27 MED ORDER — VALSARTAN-HYDROCHLOROTHIAZIDE 80-12.5 MG PO TABS
80-12.5 | ORAL_TABLET | Freq: Two times a day (BID) | ORAL | 1 refills | 90.00000 days | Status: DC
Start: 2024-01-27 — End: 2024-06-02

## 2024-01-27 MED ORDER — CIPROFLOXACIN HCL 500 MG PO TABS
500 | ORAL_TABLET | Freq: Two times a day (BID) | ORAL | 0 refills | 7.00000 days | Status: DC
Start: 2024-01-27 — End: 2024-01-27

## 2024-01-27 NOTE — Progress Notes (Signed)
 Chief Complaint   Patient presents with    Follow-up     HPI:  Wendy Ali is a 83 y.o. AA female with diabetes, hypertension, dyslipidemia presents follow-up. Patient is doing well. Blood pressure is at goal.   Last A1C showed good diabetes control.   Patient has c/o urinary frequency. U/A is positive for leukocytes.    Review of Systems  As per hpi    Past Medical History:   Diagnosis Date    Arthritis     Diabetes (HCC)     Hypercalcemia     Hypercholesterolemia     Hyperparathyroidism     Hypertension     Menopause     Sleep apnea     cpap    Vertigo     past hx     Past Surgical History:   Procedure Laterality Date    BREAST BIOPSY Left 1984    benign    COLONOSCOPY N/A 09/08/2021    COLONOSCOPY performed by Tawni Deward Gilmore Mickey., MD at [facility]    OTHER SURGICAL HISTORY      colonoscopy    TOTAL KNEE ARTHROPLASTY Right 2002    TOTAL KNEE ARTHROPLASTY       Social History     Socioeconomic History    Marital status: Married     Spouse name: None    Number of children: None    Years of education: None    Highest education level: None   Tobacco Use    Smoking status: Never    Smokeless tobacco: Never   Substance and Sexual Activity    Alcohol use: Yes     Alcohol/week: 1.0 standard drink of alcohol    Drug use: Never    Sexual activity: Not Currently     Partners: Male     Social Drivers of Health     Financial Resource Strain: Low Risk  (05/02/2023)    Overall Financial Resource Strain (CARDIA)     Difficulty of Paying Living Expenses: Not hard at all   Food Insecurity: No Food Insecurity (09/03/2023)    Hunger Vital Sign     Worried About Running Out of Food in the Last Year: Never true     Ran Out of Food in the Last Year: Never true   Transportation Needs: No Transportation Needs (09/03/2023)    PRAPARE - Therapist, art (Medical): No     Lack of Transportation (Non-Medical): No   Physical Activity: Insufficiently Active (09/03/2023)    Exercise Vital Sign     Days of  Exercise per Week: 3 days     Minutes of Exercise per Session: 40 min   Housing Stability: Low Risk  (09/03/2023)    Housing Stability Vital Sign     Unable to Pay for Housing in the Last Year: No     Number of Times Moved in the Last Year: 0     Homeless in the Last Year: No     Family History   Problem Relation Age of Onset    No Known Problems Paternal Grandfather     No Known Problems Mother         murdered    No Known Problems Paternal Grandmother     No Known Problems Maternal Grandfather     No Known Problems Maternal Grandmother     Cancer Niece         x2    Ovarian Cancer Niece  Heart Disease Brother         has pacemaker    Diabetes Brother     Heart Defect Brother         defibrilattor    Stroke Father     Hypertension Father     Diabetes Sister     Hypertension Sister     Hypertension Brother      Current Outpatient Medications   Medication Sig Dispense Refill    valsartan -hydroCHLOROthiazide  (DIOVAN -HCT) 80-12.5 MG per tablet Take 1 tablet by mouth 2 times daily 180 tablet 1    simvastatin  (ZOCOR ) 20 MG tablet TAKE 1 TABLET BY MOUTH NIGHTLY 90 tablet 3    metFORMIN  (GLUCOPHAGE ) 1000 MG tablet TAKE 1 TABLET BY MOUTH TWICE  DAILY WITH MEALS 180 tablet 3    ascorbic acid (VITAMIN C) 500 MG tablet Take 1 tablet by mouth daily      Aspirin (VAZALORE) 81 MG CAPS aspirin 81 mg capsule   Take 1 capsule every day by oral route.      vitamin D 25 MCG (1000 UT) CAPS Take 1 capsule by mouth daily      cyanocobalamin 1000 MCG tablet Take 1 tablet by mouth daily       No current facility-administered medications for this visit.     No Known Allergies    Objective:  BP 127/67   Pulse 80   Temp 97.1 F (36.2 C) (Temporal)   Resp 18   Ht 1.626 m (5' 4)   Wt 75.6 kg (166 lb 9.6 oz)   SpO2 97%   BMI 28.60 kg/m   Physical Exam:   General appearance - alert, well appearing in no distress  Mental status - alert, oriented to person, place, and time  Neck - supple, no significant adenopathy   Chest - no wheezes,  rales or rhonchi, symmetric air entry   Heart - normal S1, S2, no murmurs, rubs  Abdomen - soft, nontender, nondistended, no organomegaly  Ext-peripheral pulses normal, no pedal edema  Neuro -no focal findings   Back-full range of motion, no tenderness, palpable spasm or pain on motion     Results for orders placed or performed in visit on 01/27/24   AMB POC URINALYSIS DIP STICK AUTO W/ MICRO   Result Value Ref Range    Color (UA POC) Yellow     Clarity (UA POC) Clear     Glucose, Urine, POC Negative     Bilirubin, Urine, POC Negative     Ketones, Urine, POC Negative     Specific Gravity, Urine, POC 1.020 1.001 - 1.035    Blood (UA POC) Trace     pH, Urine, POC 6.0 4.6 - 8.0    Protein, Urine, POC Negative     Urobilinogen, POC Normal <1.1 mg/dL    Nitrite, Urine, POC Negative Negative    Leukocyte Esterase, Urine, POC 1+      Assessment/Plan:  Wendy Ali was seen today for follow-up.    Diagnoses and all orders for this visit:    Essential (primary) hypertension  -     valsartan -hydroCHLOROthiazide  (DIOVAN -HCT) 80-12.5 MG per tablet; Take 1 tablet by mouth 2 times daily    Renal insufficiency    Acute cystitis without hematuria  -     AMB POC URINALYSIS DIP STICK AUTO W/ MICRO  -     Discontinue: ciprofloxacin  (CIPRO ) 500 MG tablet; Take 1 tablet by mouth 2 times daily for 7 days  -  ciprofloxacin  (CIPRO ) 500 MG tablet; Take 1 tablet by mouth 2 times daily for 7 days  -     Culture, Urine; Future    Type II diabetes mellitus, well controlled (HCC)      Return in about 4 months (around 05/29/2024) for routine follow up.

## 2024-01-27 NOTE — Progress Notes (Unsigned)
 Chief Complaint   Patient presents with    Follow-up       Have you been to the ER, urgent care clinic since your last visit?  Hospitalized since your last visit?    NO    "Have you seen or consulted any other health care providers outside of [facility] since your last visit?"    NO            Click Here for Release of Records Request       There were no vitals filed for this visit.   Health Maintenance Due   Topic Date Due    DTaP/Tdap/Td vaccine (1 - Tdap) Never done    Respiratory Syncytial Virus (RSV) Pregnant or age 60 yrs+ (1 - 1-dose 75+ series) Never done    Pneumococcal 50+ years Vaccine (2 of 2 - PCV) 09/12/2022    Shingles vaccine (2 of 2) 03/19/2023    COVID-19 Vaccine (5 - 2024-25 season) 10/28/2023        The patient, Wendy Ali, identity was verified by name and DOB.

## 2024-01-28 ENCOUNTER — Encounter

## 2024-01-30 LAB — CULTURE, URINE: Colony count: >100000

## 2024-02-11 ENCOUNTER — Inpatient Hospital Stay: Admit: 2024-02-11 | Payer: Medicare (Managed Care) | Attending: Internal Medicine | Primary: Internal Medicine

## 2024-02-11 DIAGNOSIS — Z1231 Encounter for screening mammogram for malignant neoplasm of breast: Principal | ICD-10-CM

## 2024-04-21 ENCOUNTER — Encounter

## 2024-04-23 NOTE — Telephone Encounter (Signed)
"  Last appointment: 01/27/24  Next appointment: Advised to follow-up 05/29/24  Previous refill encounter(s): 06/01/23    Requested Prescriptions     Pending Prescriptions Disp Refills    metFORMIN  (GLUCOPHAGE ) 1000 MG tablet [Pharmacy Med Name: metFORMIN  HCl 1000 MG Oral Tablet] 180 tablet 3     Sig: TAKE 1 TABLET BY MOUTH TWICE  DAILY WITH MEALS         For Pharmacy Admin Tracking Only    Program: Medication Refill  CPA in place:    Recommendation Provided To:   Intervention Detail: New Rx: 1, reason: Patient Preference  Intervention Accepted By:   Joesph Closed?:    Time Spent (min): 5  "

## 2024-04-24 MED ORDER — METFORMIN HCL 1000 MG PO TABS
1000 | ORAL_TABLET | Freq: Two times a day (BID) | ORAL | 3 refills | 90.00000 days | Status: AC
Start: 2024-04-24 — End: ?

## 2024-05-27 ENCOUNTER — Encounter

## 2024-05-28 NOTE — Telephone Encounter (Signed)
"  Last appointment: 01/27/24  Next appointment: 06/02/24  Previous refill encounter(s): 07/15/23    Requested Prescriptions     Pending Prescriptions Disp Refills    simvastatin  (ZOCOR ) 20 MG tablet [Pharmacy Med Name: Simvastatin  20 MG Oral Tablet] 90 tablet 3     Sig: TAKE 1 TABLET BY MOUTH NIGHTLY         For Pharmacy Admin Tracking Only    Program: Medication Refill  CPA in place:    Recommendation Provided To:   Intervention Detail: New Rx: 1, reason: Patient Preference  Intervention Accepted By:   Joesph Closed?:    Time Spent (min): 5  "

## 2024-05-31 MED ORDER — SIMVASTATIN 20 MG PO TABS
20 | ORAL_TABLET | Freq: Every evening | ORAL | 3 refills | 90.00000 days | Status: DC
Start: 2024-05-31 — End: 2024-06-02

## 2024-06-02 ENCOUNTER — Encounter

## 2024-06-02 ENCOUNTER — Ambulatory Visit
Admit: 2024-06-02 | Discharge: 2024-06-02 | Payer: Medicare (Managed Care) | Attending: Internal Medicine | Primary: Internal Medicine

## 2024-06-02 VITALS — BP 130/68 | HR 80 | Temp 97.90000°F | Resp 18 | Ht 64.0 in | Wt 166.6 lb

## 2024-06-02 DIAGNOSIS — I10 Essential (primary) hypertension: Principal | ICD-10-CM

## 2024-06-02 DIAGNOSIS — N3000 Acute cystitis without hematuria: Secondary | ICD-10-CM

## 2024-06-02 MED ORDER — SIMVASTATIN 20 MG PO TABS
20 | ORAL_TABLET | Freq: Every evening | ORAL | 3 refills | 90.00000 days | Status: AC
Start: 2024-06-02 — End: ?

## 2024-06-02 MED ORDER — VALSARTAN-HYDROCHLOROTHIAZIDE 80-12.5 MG PO TABS
80-12.5 | ORAL_TABLET | Freq: Two times a day (BID) | ORAL | 1 refills | 90.00000 days | Status: AC
Start: 2024-06-02 — End: ?

## 2024-06-02 NOTE — Progress Notes (Signed)
"  Chief Complaint   Patient presents with    Follow-up       Have you been to the ER, urgent care clinic since your last visit?  Hospitalized since your last visit?    NO    Have you seen or consulted any other health care providers outside of [facility] since your last visit?    NO            Click Here for Release of Records Request       There were no vitals filed for this visit.   Health Maintenance Due   Topic Date Due    DTaP/Tdap/Td vaccine (1 - Tdap) Never done    Respiratory Syncytial Virus (RSV) Pregnant or age 66 yrs+ (1 - 1-dose 75+ series) Never done    Pneumococcal 50+ years Vaccine (2 of 2 - PCV) 09/12/2022    Shingles vaccine (2 of 2) 03/19/2023    Flu vaccine (1) 02/14/2024    COVID-19 Vaccine (5 - 2024-25 season) 03/16/2024        The patient, Wendy Ali, identity was verified by name and DOB.       After obtaining consent, and per orders of Dr. Merilynn, injection of Fluad given by [facility], LPN. Patient instructed to remain in clinic for 20 minutes afterwards, and to report any adverse reaction to me immediately.  "

## 2024-06-02 NOTE — Progress Notes (Signed)
 "Chief Complaint   Patient presents with    Follow-up     HPI:  Wendy Ali a 83 y.o. 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 Patient is doing well. Blood pressure is at goal.   Last A1C showed good diabetes control.  Patient has no new complaints. She requesting refills on some medications.    Review of Systems  As per hpi    Past Medical History:   Diagnosis Date    Arthritis     Diabetes (HCC)     Hypercalcemia     Hypercholesterolemia     Hyperparathyroidism     Hypertension     Menopause     Sleep apnea     cpap    Vertigo     past hx     Past Surgical History:   Procedure Laterality Date    BREAST BIOPSY Left 1984    benign    COLONOSCOPY N/A 09/08/2021    COLONOSCOPY performed by Tawni Deward Gilmore Mickey., MD at [facility]    OTHER SURGICAL HISTORY      colonoscopy    TOTAL KNEE ARTHROPLASTY Right 2002    TOTAL KNEE ARTHROPLASTY       Social History     Socioeconomic History    Marital status: Married     Spouse name: None    Number of children: None    Years of education: None    Highest education level: None   Tobacco Use    Smoking status: Never    Smokeless tobacco: Never   Substance and Sexual Activity    Alcohol use: Yes     Alcohol/week: 1.0 standard drink of alcohol    Drug use: Never    Sexual activity: Not Currently     Partners: Male     Social Drivers of Health     Financial Resource Strain: Low Risk  (05/02/2023)    Overall Financial Resource Strain (CARDIA)     Difficulty of Paying Living Expenses: Not hard at all   Food Insecurity: No Food Insecurity (09/03/2023)    Hunger Vital Sign     Worried About Running Out of Food in the Last Year: Never true     Ran Out of Food in the Last Year: Never true   Transportation Needs: No Transportation Needs (09/03/2023)    PRAPARE - Therapist, Art (Medical): No     Lack of Transportation (Non-Medical): No   Physical Activity: Insufficiently Active (09/03/2023)     Exercise Vital Sign     Days of Exercise per Week: 3 days     Minutes of Exercise per Session: 40 min   Housing Stability: Low Risk  (09/03/2023)    Housing Stability Vital Sign     Unable to Pay for Housing in the Last Year: No     Number of Times Moved in the Last Year: 0     Homeless in the Last Year: No     Family History   Problem Relation Age of Onset    No Known Problems Paternal Grandfather     No Known Problems Mother         murdered    No Known Problems Paternal Grandmother     No Known Problems Maternal Grandfather     No Known Problems Maternal Grandmother     Cancer Niece         x2  Ovarian Cancer Niece     Heart Disease Brother         has pacemaker    Diabetes Brother     Heart Defect Brother         defibrilattor    Stroke Father     Hypertension Father     Diabetes Sister     Hypertension Sister     Hypertension Brother      Current Outpatient Medications   Medication Sig Dispense Refill    simvastatin  (ZOCOR ) 20 MG tablet TAKE 1 TABLET BY MOUTH NIGHTLY 90 tablet 3    metFORMIN  (GLUCOPHAGE ) 1000 MG tablet TAKE 1 TABLET BY MOUTH TWICE  DAILY WITH MEALS 180 tablet 3    valsartan -hydroCHLOROthiazide  (DIOVAN -HCT) 80-12.5 MG per tablet Take 1 tablet by mouth 2 times daily 180 tablet 1    ascorbic acid (VITAMIN C) 500 MG tablet Take 1 tablet by mouth daily      Aspirin (VAZALORE) 81 MG CAPS aspirin 81 mg capsule   Take 1 capsule every day by oral route.      vitamin D 25 MCG (1000 UT) CAPS Take 1 capsule by mouth daily      cyanocobalamin 1000 MCG tablet Take 1 tablet by mouth daily       No current facility-administered medications for this visit.     No Known Allergies    Objective:  BP 130/68   Pulse 80   Temp 97.9 F (36.6 C) (Oral)   Resp 18   Ht 1.626 m (5' 4)   Wt 75.6 kg (166 lb 9.6 oz)   SpO2 99%   BMI 28.60 kg/m   Physical Exam:   General appearance - alert, well appearing in no distress  Mental status - alert, oriented to person, place, and time  Neck - supple, no significant  adenopathy   Chest -no wheezes, rales or rhonchi, symmetric air entry   Heart -normal S1, S2, no murmurs, rubs  Abdomen - soft, nontender, nondistended, no organomegaly  Ext-peripheral pulses normal, no pedal edema  Neuro -no focal findings   Back-full range of motion, no tenderness, palpable spasm or pain on motion     Assessment/Plan:  Wendy Ali was seen today for follow-up.    Diagnoses and all orders for this visit:    Essential (primary) hypertension  -     Comprehensive Metabolic Panel; Future  -     valsartan -hydroCHLOROthiazide  (DIOVAN -HCT) 80-12.5 MG per tablet; Take 1 tablet by mouth 2 times daily    Need for vaccination  -     Influenza, FLUAD Trivalent, (age 33 y+), IM, Preservative Free, 0.5mL    Type 2 diabetes mellitus without complication, without long-term current use of insulin (HCC)  -     Comprehensive Metabolic Panel; Future  -     Hemoglobin A1C; Future    Pure hypercholesterolemia, unspecified  -     Lipid Panel; Future  -     simvastatin  (ZOCOR ) 20 MG tablet; Take 1 tablet by mouth nightly    Acute cystitis without hematuria  -     Urinalysis with Reflex to Culture; Future      Return in about 4 months (around 09/30/2024), or if symptoms worsen or fail to improve, for routine follow up.    "

## 2024-06-03 LAB — COMPREHENSIVE METABOLIC PANEL
ALT: 14 U/L (ref 10–35)
AST: 30 U/L (ref 10–35)
Alk Phosphatase: 71 U/L (ref 35–104)
Anion Gap: 11 mmol/L (ref 2–14)
BUN/Creatinine Ratio: 22 — ABNORMAL HIGH (ref 12–20)
CO2: 27 mmol/L (ref 20–29)
Calcium: 11.4 mg/dL — ABNORMAL HIGH (ref 8.8–10.2)
Chloride: 100 mmol/L (ref 98–107)
Glucose: 101 mg/dL — ABNORMAL HIGH (ref 65–100)
Sodium: 139 mmol/L (ref 136–145)

## 2024-06-03 LAB — URINALYSIS WITH REFLEX TO CULTURE
Bilirubin, Urine: NEGATIVE
Blood, Urine: NEGATIVE
Nitrite, Urine: NEGATIVE

## 2024-06-03 LAB — HEMOGLOBIN A1C: Estimated Avg Glucose: 100 mg/dL
